# Patient Record
Sex: Female | Born: 1975 | Race: Black or African American | Hispanic: No | Marital: Married | Smoking: Never smoker
Health system: Southern US, Community
[De-identification: ages and names within clinical notes are randomized; demographics above are authoritative.]

## PROBLEM LIST (undated history)

## (undated) DIAGNOSIS — G4733 Obstructive sleep apnea (adult) (pediatric): Secondary | ICD-10-CM

## (undated) DIAGNOSIS — F329 Major depressive disorder, single episode, unspecified: Secondary | ICD-10-CM

## (undated) DIAGNOSIS — E119 Type 2 diabetes mellitus without complications: Secondary | ICD-10-CM

## (undated) DIAGNOSIS — H04123 Dry eye syndrome of bilateral lacrimal glands: Secondary | ICD-10-CM

## (undated) DIAGNOSIS — G4730 Sleep apnea, unspecified: Secondary | ICD-10-CM

## (undated) DIAGNOSIS — E785 Hyperlipidemia, unspecified: Secondary | ICD-10-CM

## (undated) DIAGNOSIS — I10 Essential (primary) hypertension: Secondary | ICD-10-CM

## (undated) DIAGNOSIS — D509 Iron deficiency anemia, unspecified: Secondary | ICD-10-CM

## (undated) DIAGNOSIS — E669 Obesity, unspecified: Secondary | ICD-10-CM

## (undated) DIAGNOSIS — R519 Headache, unspecified: Secondary | ICD-10-CM

## (undated) DIAGNOSIS — B019 Varicella without complication: Secondary | ICD-10-CM

## (undated) DIAGNOSIS — Z973 Presence of spectacles and contact lenses: Secondary | ICD-10-CM

## (undated) DIAGNOSIS — R7303 Prediabetes: Secondary | ICD-10-CM

## (undated) DIAGNOSIS — M6530 Trigger finger, unspecified finger: Secondary | ICD-10-CM

## (undated) DIAGNOSIS — M26609 Unspecified temporomandibular joint disorder, unspecified side: Secondary | ICD-10-CM

## (undated) DIAGNOSIS — M1990 Unspecified osteoarthritis, unspecified site: Secondary | ICD-10-CM

## (undated) DIAGNOSIS — N840 Polyp of corpus uteri: Secondary | ICD-10-CM

## (undated) DIAGNOSIS — F419 Anxiety disorder, unspecified: Secondary | ICD-10-CM

## (undated) DIAGNOSIS — D649 Anemia, unspecified: Secondary | ICD-10-CM

## (undated) DIAGNOSIS — D259 Leiomyoma of uterus, unspecified: Secondary | ICD-10-CM

## (undated) DIAGNOSIS — N921 Excessive and frequent menstruation with irregular cycle: Secondary | ICD-10-CM

## (undated) DIAGNOSIS — N3281 Overactive bladder: Secondary | ICD-10-CM

## (undated) DIAGNOSIS — K219 Gastro-esophageal reflux disease without esophagitis: Secondary | ICD-10-CM

## (undated) DIAGNOSIS — R51 Headache: Secondary | ICD-10-CM

## (undated) DIAGNOSIS — F32A Depression, unspecified: Secondary | ICD-10-CM

## (undated) HISTORY — DX: Hyperlipidemia, unspecified: E78.5

## (undated) HISTORY — DX: Headache: R51

## (undated) HISTORY — DX: Anxiety disorder, unspecified: F41.9

## (undated) HISTORY — DX: Essential (primary) hypertension: I10

## (undated) HISTORY — DX: Varicella without complication: B01.9

## (undated) HISTORY — DX: Headache, unspecified: R51.9

## (undated) HISTORY — DX: Obesity, unspecified: E66.9

## (undated) HISTORY — DX: Depression, unspecified: F32.A

## (undated) HISTORY — PX: WISDOM TOOTH EXTRACTION: SHX21

## (undated) HISTORY — DX: Major depressive disorder, single episode, unspecified: F32.9

---

## 2001-03-04 ENCOUNTER — Other Ambulatory Visit: Admission: RE | Admit: 2001-03-04 | Discharge: 2001-03-04 | Payer: Self-pay | Admitting: *Deleted

## 2002-03-08 ENCOUNTER — Other Ambulatory Visit: Admission: RE | Admit: 2002-03-08 | Discharge: 2002-03-08 | Payer: Self-pay | Admitting: *Deleted

## 2003-04-10 ENCOUNTER — Other Ambulatory Visit: Admission: RE | Admit: 2003-04-10 | Discharge: 2003-04-10 | Payer: Self-pay | Admitting: *Deleted

## 2004-04-19 ENCOUNTER — Inpatient Hospital Stay (HOSPITAL_COMMUNITY): Admission: AD | Admit: 2004-04-19 | Discharge: 2004-04-19 | Payer: Self-pay | Admitting: *Deleted

## 2004-05-01 ENCOUNTER — Inpatient Hospital Stay (HOSPITAL_COMMUNITY): Admission: AD | Admit: 2004-05-01 | Discharge: 2004-05-01 | Payer: Self-pay | Admitting: Obstetrics and Gynecology

## 2004-05-03 ENCOUNTER — Inpatient Hospital Stay (HOSPITAL_COMMUNITY): Admission: AD | Admit: 2004-05-03 | Discharge: 2004-05-06 | Payer: Self-pay | Admitting: Obstetrics and Gynecology

## 2005-04-07 ENCOUNTER — Ambulatory Visit (HOSPITAL_COMMUNITY): Admission: RE | Admit: 2005-04-07 | Discharge: 2005-04-07 | Payer: Self-pay | Admitting: Gynecology

## 2005-08-25 ENCOUNTER — Inpatient Hospital Stay (HOSPITAL_COMMUNITY): Admission: AD | Admit: 2005-08-25 | Discharge: 2005-08-27 | Payer: Self-pay | Admitting: Gynecology

## 2010-03-12 ENCOUNTER — Ambulatory Visit: Payer: Self-pay

## 2011-04-28 ENCOUNTER — Emergency Department: Payer: Self-pay | Admitting: Emergency Medicine

## 2012-10-27 DIAGNOSIS — F419 Anxiety disorder, unspecified: Secondary | ICD-10-CM

## 2012-10-27 HISTORY — DX: Anxiety disorder, unspecified: F41.9

## 2015-01-30 ENCOUNTER — Encounter: Payer: Self-pay | Admitting: *Deleted

## 2016-01-04 LAB — HM MAMMOGRAPHY

## 2016-01-05 LAB — CBC AND DIFFERENTIAL
HCT: 36 % (ref 36–46)
Hemoglobin: 11.8 g/dL — AB (ref 12.0–16.0)
Neutrophils Absolute: 4 /uL
Platelets: 345 10*3/uL (ref 150–399)
WBC: 6.1 10^3/mL

## 2016-01-05 LAB — HEPATIC FUNCTION PANEL
ALT: 20 U/L (ref 7–35)
AST: 21 U/L (ref 13–35)
Bilirubin, Total: 0.2 mg/dL

## 2016-01-05 LAB — BASIC METABOLIC PANEL
BUN: 8 mg/dL (ref 4–21)
Creatinine: 0.7 mg/dL (ref 0.5–1.1)
Glucose: 89 mg/dL
Potassium: 4.1 mmol/L (ref 3.4–5.3)
Sodium: 139 mmol/L (ref 137–147)

## 2016-01-05 LAB — TSH: TSH: 1.69 u[IU]/mL (ref 0.41–5.90)

## 2016-01-14 ENCOUNTER — Encounter: Payer: Self-pay | Admitting: Family Medicine

## 2016-01-14 ENCOUNTER — Ambulatory Visit (INDEPENDENT_AMBULATORY_CARE_PROVIDER_SITE_OTHER): Payer: BLUE CROSS/BLUE SHIELD | Admitting: Family Medicine

## 2016-01-14 VITALS — BP 124/86 | HR 100 | Temp 98.7°F | Ht 66.0 in | Wt 229.8 lb

## 2016-01-14 DIAGNOSIS — F329 Major depressive disorder, single episode, unspecified: Secondary | ICD-10-CM | POA: Insufficient documentation

## 2016-01-14 DIAGNOSIS — R030 Elevated blood-pressure reading, without diagnosis of hypertension: Secondary | ICD-10-CM | POA: Insufficient documentation

## 2016-01-14 DIAGNOSIS — F419 Anxiety disorder, unspecified: Secondary | ICD-10-CM

## 2016-01-14 DIAGNOSIS — Z0000 Encounter for general adult medical examination without abnormal findings: Secondary | ICD-10-CM | POA: Diagnosis not present

## 2016-01-14 DIAGNOSIS — E669 Obesity, unspecified: Secondary | ICD-10-CM

## 2016-01-14 DIAGNOSIS — R7303 Prediabetes: Secondary | ICD-10-CM

## 2016-01-14 DIAGNOSIS — IMO0001 Reserved for inherently not codable concepts without codable children: Secondary | ICD-10-CM

## 2016-01-14 MED ORDER — DIAZEPAM 5 MG PO TABS: ORAL_TABLET | ORAL | Status: DC

## 2016-01-14 NOTE — Assessment & Plan Note (Signed)
Patient reports recent elevated blood pressure. Blood pressure in the prehypertensive range today. Advised patient to check her blood pressure daily and keep a log. If BP consistently elevated greater than 140/90 need medication.

## 2016-01-14 NOTE — Progress Notes (Signed)
Pre visit review using our clinic review tool, if applicable. No additional management support is needed unless otherwise documented below in the visit note. 

## 2016-01-14 NOTE — Assessment & Plan Note (Signed)
Tetanus and influenza vaccines up-to-date. Pap smear & mammogram up-to-date. Discussed genetic screening for cancer as her OB/GYN offers this. Urged her to inquire about this. Patient declined screening labs. Advise diet, exercise, and weight loss.

## 2016-01-14 NOTE — Progress Notes (Addendum)
Subjective:  Patient ID: Kathleen Burke, female    DOB: Mar 08, 1976  Age: 40 y.o. MRN: [id number]  CC: Establish care  HPI Kathleen Burke is a 40 y.o. female presents to the clinic today to establish care.  Preventative Healthcare  Pap smear: Up to date. Recent done on 01/04/16.  Mammogram: Up to date. 01/04/16.  Colonoscopy: Not indicated.   Immunizations  Tetanus - Up to date.   Pneumococcal - Not indicated.   Flu - Up to date.   Zoster - N/A.   Labs: Has had labs via work and [employer]. Patient declined labs today.  Exercise: Does not exercise regularly.  Alcohol use: See below.   Smoking/tobacco use: Nonsmoker  Wears seat belt: Yes.   PMH, Surgical Hx, Family Hx, Social History reviewed and updated as below.  Past Medical History  Diagnosis Date  . Chicken pox   . Frequent headaches   . Hypertension    Past Surgical History  Procedure Laterality Date  . No past surgeries     Family History  Problem Relation Age of Onset  . Arthritis Mother   . Ovarian cancer Mother   . Hyperlipidemia Mother   . Hypertension Mother   . Diabetes Father   . Breast cancer Maternal Grandmother   . Arthritis Paternal Grandmother   . Arthritis Paternal Grandfather   . Prostate cancer Paternal Grandfather   . Hyperlipidemia Paternal Grandfather   . Heart disease Paternal Grandfather   . Stroke Paternal Grandfather   . Hypertension Paternal Grandfather   . Diabetes Paternal Grandfather    Social History  Substance Use Topics  . Smoking status: Never Smoker   . Smokeless tobacco: Never Used  . Alcohol Use: 0.0 - 0.6 oz/week    0-1 Standard drinks or equivalent per week   Review of Systems  Cardiovascular:       Recent elevated BP.  Neurological: Positive for headaches.  Psychiatric/Behavioral: The patient is nervous/anxious.   All other systems reviewed and are negative.  Objective:   Today's Vitals: BP 124/86 mmHg  Pulse 100  Temp(Src) 98.7 F (37.1 C) (Oral)   Ht  (1.676 m)  Wt 229 lb 12 oz (104.214 kg)  BMI 37.10 kg/m2  SpO2 97%  Physical Exam  Constitutional: She is oriented to person, place, and time. She appears well-developed and well-nourished. No distress.  Obese.  HENT:  Head: Normocephalic and atraumatic.  Mouth/Throat: Oropharynx is clear and moist. No oropharyngeal exudate.  Normal TM's bilaterally.   Eyes: Conjunctivae are normal. No scleral icterus.  Neck: Neck supple. No thyromegaly present.  Cardiovascular: Normal rate and regular rhythm.   No murmur heard. Pulmonary/Chest: Effort normal and breath sounds normal. She has no wheezes. She has no rales.  Abdominal: Soft. She exhibits no distension. There is no tenderness. There is no rebound and no guarding.  Musculoskeletal: Normal range of motion. She exhibits no edema.  Lymphadenopathy:    She has no cervical adenopathy.  Neurological: She is alert and oriented to person, place, and time.  Skin: Skin is warm and dry. No rash noted.  Psychiatric: She has a normal mood and affect.  Vitals reviewed.  Assessment & Plan:   Problem List Items Addressed This Visit    Preventative health care - Primary    Tetanus and influenza vaccines up-to-date. Pap smear & mammogram up-to-date. Discussed genetic screening for cancer as her OB/GYN offers this. Urged her to inquire about this. Patient declined screening labs. Advise diet,  exercise, and weight loss.      Prediabetes   Obesity (BMI 30-39.9)   Elevated BP    Patient reports recent elevated blood pressure. Blood pressure in the prehypertensive range today. Advised patient to check her blood pressure daily and keep a log. If BP consistently elevated greater than 140/90 need medication.      Anxiety   Relevant Medications   venlafaxine XR ([name]) 37.5 MG 24 hr capsule      Outpatient Encounter Prescriptions as of 01/14/2016  Medication Sig  . cholecalciferol (VITAMIN D) 1000 units tablet Take 1,000 Units by  mouth daily.  . Iron Combinations (IRON COMPLEX PO) Take by mouth.  . venlafaxine XR ([name]) 37.5 MG 24 hr capsule Take 37.5 mg by mouth daily.   No facility-administered encounter medications on file as of 01/14/2016.    Follow-up: Annually.  Everlene OtherJayce Americus Perkey DO [facility] [place]

## 2016-01-14 NOTE — Patient Instructions (Signed)
Continue your current medications.  Check your BP daily. Let me know if they are consistently above 140/90.  Follow up annually.  Take care  Dr. Lacinda Axon    Health Maintenance, Female Adopting a healthy lifestyle and getting preventive care can go a long way to promote health and wellness. Talk with your health care provider about what schedule of regular examinations is right for you. This is a good chance for you to check in with your provider about disease prevention and staying healthy. In between checkups, there are plenty of things you can do on your own. Experts have done a lot of research about which lifestyle changes and preventive measures are most likely to keep you healthy. Ask your health care provider for more information. WEIGHT AND DIET  Eat a healthy diet  Be sure to include plenty of vegetables, fruits, low-fat dairy products, and lean protein.  Do not eat a lot of foods high in solid fats, added sugars, or salt.  Get regular exercise. This is one of the most important things you can do for your health.  Most adults should exercise for at least 150 minutes each week. The exercise should increase your heart rate and make you sweat (moderate-intensity exercise).  Most adults should also do strengthening exercises at least twice a week. This is in addition to the moderate-intensity exercise.  Maintain a healthy weight  Body mass index (BMI) is a measurement that can be used to identify possible weight problems. It estimates body fat based on height and weight. Your health care provider can help determine your BMI and help you achieve or maintain a healthy weight.  For females 53 years of age and older:   A BMI below 18.5 is considered underweight.  A BMI of 18.5 to 24.9 is normal.  A BMI of 25 to 29.9 is considered overweight.  A BMI of 30 and above is considered obese.  Watch levels of cholesterol and blood lipids  You should start having your blood tested for  lipids and cholesterol at 42 years of age, then have this test every 5 years.  You may need to have your cholesterol levels checked more often if:  Your lipid or cholesterol levels are high.  You are older than 42 years of age.  You are at high risk for heart disease.  CANCER SCREENING   Lung Cancer  Lung cancer screening is recommended for adults 48-74 years old who are at high risk for lung cancer because of a history of smoking.  A yearly low-dose CT scan of the lungs is recommended for people who:  Currently smoke.  Have quit within the past 15 years.  Have at least a 30-pack-year history of smoking. A pack year is smoking an average of one pack of cigarettes a day for 1 year.  Yearly screening should continue until it has been 15 years since you quit.  Yearly screening should stop if you develop a health problem that would prevent you from having lung cancer treatment.  Breast Cancer  Practice breast self-awareness. This means understanding how your breasts normally appear and feel.  It also means doing regular breast self-exams. Let your health care provider know about any changes, no matter how small.  If you are in your 20s or 30s, you should have a clinical breast exam (CBE) by a health care provider every 1-3 years as part of a regular health exam.  If you are 88 or older, have a CBE every year.  Also consider having a breast X-ray (mammogram) every year.  If you have a family history of breast cancer, talk to your health care provider about genetic screening.  If you are at high risk for breast cancer, talk to your health care provider about having an MRI and a mammogram every year.  Breast cancer gene (BRCA) assessment is recommended for women who have family members with BRCA-related cancers. BRCA-related cancers include:  Breast.  Ovarian.  Tubal.  Peritoneal cancers.  Results of the assessment will determine the need for genetic counseling and BRCA1  and BRCA2 testing. Cervical Cancer Your health care provider may recommend that you be screened regularly for cancer of the pelvic organs (ovaries, uterus, and vagina). This screening involves a pelvic examination, including checking for microscopic changes to the surface of your cervix (Pap test). You may be encouraged to have this screening done every 3 years, beginning at age 20.  For women ages 5-65, health care providers may recommend pelvic exams and Pap testing every 3 years, or they may recommend the Pap and pelvic exam, combined with testing for human papilloma virus (HPV), every 5 years. Some types of HPV increase your risk of cervical cancer. Testing for HPV may also be done on women of any age with unclear Pap test results.  Other health care providers may not recommend any screening for nonpregnant women who are considered low risk for pelvic cancer and who do not have symptoms. Ask your health care provider if a screening pelvic exam is right for you.  If you have had past treatment for cervical cancer or a condition that could lead to cancer, you need Pap tests and screening for cancer for at least 20 years after your treatment. If Pap tests have been discontinued, your risk factors (such as having a new sexual partner) need to be reassessed to determine if screening should resume. Some women have medical problems that increase the chance of getting cervical cancer. In these cases, your health care provider may recommend more frequent screening and Pap tests. Colorectal Cancer  This type of cancer can be detected and often prevented.  Routine colorectal cancer screening usually begins at 42 years of age and continues through 42 years of age.  Your health care provider may recommend screening at an earlier age if you have risk factors for colon cancer.  Your health care provider may also recommend using home test kits to check for hidden blood in the stool.  A small camera at the  end of a tube can be used to examine your colon directly (sigmoidoscopy or colonoscopy). This is done to check for the earliest forms of colorectal cancer.  Routine screening usually begins at age 74.  Direct examination of the colon should be repeated every 5-10 years through 42 years of age. However, you may need to be screened more often if early forms of precancerous polyps or small growths are found. Skin Cancer  Check your skin from head to toe regularly.  Tell your health care provider about any new moles or changes in moles, especially if there is a change in a mole's shape or color.  Also tell your health care provider if you have a mole that is larger than the size of a pencil eraser.  Always use sunscreen. Apply sunscreen liberally and repeatedly throughout the day.  Protect yourself by wearing long sleeves, pants, a wide-brimmed hat, and sunglasses whenever you are outside. HEART DISEASE, DIABETES, AND HIGH BLOOD PRESSURE  High blood pressure causes heart disease and increases the risk of stroke. High blood pressure is more likely to develop in:  People who have blood pressure in the high end of the normal range (130-139/85-89 mm Hg).  People who are overweight or obese.  People who are African American.  If you are 18-39 years of age, have your blood pressure checked every 3-5 years. If you are 40 years of age or older, have your blood pressure checked every year. You should have your blood pressure measured twice--once when you are at a hospital or clinic, and once when you are not at a hospital or clinic. Record the average of the two measurements. To check your blood pressure when you are not at a hospital or clinic, you can use:  An automated blood pressure machine at a pharmacy.  A home blood pressure monitor.  If you are between 55 years and 79 years old, ask your health care provider if you should take aspirin to prevent strokes.  Have regular diabetes  screenings. This involves taking a blood sample to check your fasting blood sugar level.  If you are at a normal weight and have a low risk for diabetes, have this test once every three years after 42 years of age.  If you are overweight and have a high risk for diabetes, consider being tested at a younger age or more often. PREVENTING INFECTION  Hepatitis B  If you have a higher risk for hepatitis B, you should be screened for this virus. You are considered at high risk for hepatitis B if:  You were born in a country where hepatitis B is common. Ask your health care provider which countries are considered high risk.  Your parents were born in a high-risk country, and you have not been immunized against hepatitis B (hepatitis B vaccine).  You have HIV or AIDS.  You use needles to inject street drugs.  You live with someone who has hepatitis B.  You have had sex with someone who has hepatitis B.  You get hemodialysis treatment.  You take certain medicines for conditions, including cancer, organ transplantation, and autoimmune conditions. Hepatitis C  Blood testing is recommended for:  Everyone born from 1945 through 1965.  Anyone with known risk factors for hepatitis C. Sexually transmitted infections (STIs)  You should be screened for sexually transmitted infections (STIs) including gonorrhea and chlamydia if:  You are sexually active and are younger than 42 years of age.  You are older than 42 years of age and your health care provider tells you that you are at risk for this type of infection.  Your sexual activity has changed since you were last screened and you are at an increased risk for chlamydia or gonorrhea. Ask your health care provider if you are at risk.  If you do not have HIV, but are at risk, it may be recommended that you take a prescription medicine daily to prevent HIV infection. This is called pre-exposure prophylaxis (PrEP). You are considered at risk  if:  You are sexually active and do not regularly use condoms or know the HIV status of your partner(s).  You take drugs by injection.  You are sexually active with a partner who has HIV. Talk with your health care provider about whether you are at high risk of being infected with HIV. If you choose to begin PrEP, you should first be tested for HIV. You should then be tested every 3 months for as   long as you are taking PrEP.  PREGNANCY   If you are premenopausal and you may become pregnant, ask your health care provider about preconception counseling.  If you may become pregnant, take 400 to 800 micrograms (mcg) of folic acid every day.  If you want to prevent pregnancy, talk to your health care provider about birth control (contraception). OSTEOPOROSIS AND MENOPAUSE   Osteoporosis is a disease in which the bones lose minerals and strength with aging. This can result in serious bone fractures. Your risk for osteoporosis can be identified using a bone density scan.  If you are 30 years of age or older, or if you are at risk for osteoporosis and fractures, ask your health care provider if you should be screened.  Ask your health care provider whether you should take a calcium or vitamin D supplement to lower your risk for osteoporosis.  Menopause may have certain physical symptoms and risks.  Hormone replacement therapy may reduce some of these symptoms and risks. Talk to your health care provider about whether hormone replacement therapy is right for you.  HOME CARE INSTRUCTIONS   Schedule regular health, dental, and eye exams.  Stay current with your immunizations.   Do not use any tobacco products including cigarettes, chewing tobacco, or electronic cigarettes.  If you are pregnant, do not drink alcohol.  If you are breastfeeding, limit how much and how often you drink alcohol.  Limit alcohol intake to no more than 1 drink per day for nonpregnant women. One drink equals 12  ounces of beer, 5 ounces of wine, or 1 ounces of hard liquor.  Do not use street drugs.  Do not share needles.  Ask your health care provider for help if you need support or information about quitting drugs.  Tell your health care provider if you often feel depressed.  Tell your health care provider if you have ever been abused or do not feel safe at home.   This information is not intended to replace advice given to you by your health care provider. Make sure you discuss any questions you have with your health care provider.   Document Released: 04/28/2011 Document Revised: 11/03/2014 Document Reviewed: 09/14/2013 Elsevier Interactive Patient Education [employer].

## 2016-01-14 NOTE — Addendum Note (Signed)
Addended by: Tommie SamsOOK, Regene Mccarthy G on: 01/14/2016 02:05 PM   Modules accepted: Orders

## 2016-02-19 ENCOUNTER — Encounter: Payer: Self-pay | Admitting: Family Medicine

## 2017-02-26 ENCOUNTER — Ambulatory Visit: Payer: Self-pay | Admitting: Certified Nurse Midwife

## 2017-02-27 ENCOUNTER — Ambulatory Visit (INDEPENDENT_AMBULATORY_CARE_PROVIDER_SITE_OTHER): Payer: BLUE CROSS/BLUE SHIELD | Admitting: Certified Nurse Midwife

## 2017-02-27 ENCOUNTER — Encounter: Payer: Self-pay | Admitting: Certified Nurse Midwife

## 2017-02-27 VITALS — BP 160/90 | HR 105 | Ht 66.0 in | Wt 240.0 lb

## 2017-02-27 DIAGNOSIS — Z1231 Encounter for screening mammogram for malignant neoplasm of breast: Secondary | ICD-10-CM

## 2017-02-27 DIAGNOSIS — Z01419 Encounter for gynecological examination (general) (routine) without abnormal findings: Secondary | ICD-10-CM

## 2017-02-27 DIAGNOSIS — Z124 Encounter for screening for malignant neoplasm of cervix: Secondary | ICD-10-CM | POA: Diagnosis not present

## 2017-02-27 DIAGNOSIS — R030 Elevated blood-pressure reading, without diagnosis of hypertension: Secondary | ICD-10-CM | POA: Diagnosis not present

## 2017-02-27 DIAGNOSIS — Z1239 Encounter for other screening for malignant neoplasm of breast: Secondary | ICD-10-CM

## 2017-02-27 NOTE — Progress Notes (Signed)
Gynecology Annual Exam  PCP: Tommie Samsook, Jayce G, DO  Chief Complaint:  Chief Complaint  Patient presents with  . Gynecologic Exam    History of Present Illness: Kathleen Burke is a 41 year old African American/Black female, G2 P2002, who presents for her annual exam. She has no significant gyn problems.  Her menses have been regular since her last annual exam on 01/04/16. LMP 02/17/2017. They occur every 1 month , they lasted 6 days , and are medium  Flow. She had some menstrual irregularity at the beginning of 2017, but that resolved spontaneously.  She has had no intermenstrual  spotting.  She reports dysmenorrhea  thru the first 2days of her menses. She uses Excedrin with symptomatic relief.  The patient's past medical history is notable for obesity, anxiety (on Effexor since 2014),  menorrhagia (had an endometrial biopsy in 2014 which was B9), AGUS Pap in 2014 ( colpo and ECC negative) and a family history of of hypertension, hypercholesterolemia, and diabetes.. Since her last annual exam , she has had very labile blood pressures, but has not been prescribed antihypertensives. She has also gained 9# and her BMI is 38.74 kg/m2.. She takes iron for anemia   She is sexually active. She is currently using a vasectomy for contraception.  Her most recent pap smear was obtained 01/04/16 and was NIL/ neg HRHPV Her most recent mammogram obtained 1 year ago was normal.  There is a positive history of breast cancer in her maternal grandmother. Genetic testing has not been done.  There is no family history of ovarian cancer.  The patient does do maonthly self breast exams.  The patient does not smoke.  The patient does drink 2 glasses of wine on the weekends.  The patient does not use illegal drugs.  The patient does exercise twice a week by walking x 1 hour. The patient does get adequate calcium in her diet.  She had a recent cholesterol screen in 2017 that was normal.   Review of Systems:  Review of Systems  Constitutional: Negative for chills, fever and weight loss.       Positive for weight gain  HENT: Negative for congestion, sinus pain and sore throat.   Eyes: Negative for blurred vision and pain.  Respiratory: Negative for hemoptysis, shortness of breath and wheezing.   Cardiovascular: Negative for chest pain, palpitations and leg swelling.  Gastrointestinal: Negative for abdominal pain, blood in stool, diarrhea, heartburn, nausea and vomiting.  Genitourinary: Negative for dysuria, frequency, hematuria and urgency.  Musculoskeletal: Negative for back pain, joint pain and myalgias.  Skin: Negative for itching and rash.  Neurological: Negative for dizziness, tingling and headaches.  Endo/Heme/Allergies: Negative for environmental allergies and polydipsia. Does not bruise/bleed easily.       Negative for hirsutism   Psychiatric/Behavioral: Negative for depression. The patient is not nervous/anxious and does not have insomnia.     Past Medical History:  Past Medical History:  Diagnosis Date  . Anxiety 2014  . Chicken pox   . Frequent headaches   . Hypertension   . Obesity (BMI 30-39.9)     Past Surgical History:  Past Surgical History:  Procedure Laterality Date  . WISDOM TOOTH EXTRACTION        Family History:  Family History  Problem Relation Age of Onset  . Arthritis Mother   . Hyperlipidemia Mother   . Hypertension Mother   . Diabetes Father     DM I  . Breast  cancer Maternal Grandmother 50  . Arthritis Paternal Grandmother   . Diabetes Mellitus II Paternal Grandmother   . Arthritis Paternal Grandfather   . Hyperlipidemia Paternal Grandfather   . Stroke Paternal Grandfather   . Hypertension Paternal Grandfather   . Diabetes Mellitus II Sister   . Diabetes Mellitus II Sister   . Hyperlipidemia Sister   . Hypertension Sister     Social History:  Social History   Social History  . Marital status: Married    Spouse name: N/A  . Number of  children: 2  . Years of education: N/A   Occupational History  . RN    Social History Main Topics  . Smoking status: Never Smoker  . Smokeless tobacco: Never Used  . Alcohol use 1.2 oz/week    2 Glasses of wine per week  . Drug use: No  . Sexual activity: Yes    Partners: Male    Birth control/ protection: Surgical     Comment: vasectomy   Other Topics Concern  . Not on file   Social History Narrative  . No narrative on file    Allergies:  No Known Allergies  Medications: Prior to Admission medications   Medication Sig Start Date End Date Taking? Authorizing Provider  ALPRAZolam Prudy Feeler) 0.5 MG tablet  01/29/17   Historical Provider, MD  cholecalciferol (VITAMIN D) 1000 units tablet Take 1,000 Units by mouth daily.    Historical Provider, MD  diazepam (VALIUM) 5 MG tablet 1 tablet once; 1 hour prior to flying. 01/14/16   Tommie Sams, DO  Iron Combinations (IRON COMPLEX PO) Take by mouth.    Historical Provider, MD  venlafaxine XR ([name]) 37.5 MG 24 hr capsule Take 37.5 mg by mouth daily. 11/19/15   Historical Provider, MD    Physical Exam Vitals: Blood pressure (!) 160/90, pulse (!) 105, height 5\' 6"  (1.676 m), weight 108.9 kg (240 lb), BMI 38.74 kg/m2, last menstrual period 02/17/2017, SpO2 98 %.  General: pleasant BF, in NAD HEENT: normocephalic, anicteric Thyroid: no enlargement, no palpable nodules Pulmonary: No increased work of breathing, CTAB Cardiovascular: RRR without murmur Breast: Breast symmetrical, no tenderness, no palpable nodules or masses, no skin or nipple retraction present, no nipple discharge.  No axillary, infraclavicular, or supraclavicular lymphadenopathy. Abdomen: obese, soft, non-tender, non-distended.  Umbilicus without lesions.  No hepatomegaly or masses palpable. No evidence of hernia  Genitourinar  External: Normal external female genitalia.  Normal urethral meatus, normal Bartholin's and Skene's glands.    Vagina: Normal vaginal mucosa,  no evidence of prolapse.    Cervix: Grossly normal in appearance, no bleeding  Uterus: RF, decreased mobility, TLNS, NT  Adnexa: no adnexal masses, NT  Rectal: deferred  Lymphatic: no evidence of inguinal lymphadenopathy Extremities: no edema, erythema, or tenderness Neurologic: Grossly intact Psychiatric: mood appropriate, affect full      Assessment: 41 y.o. [id number] well woman exam  Plan: Problem List Items Addressed This Visit    None    Visit Diagnoses    Screening for cervical cancer    -  Primary   Relevant Orders   IGP,rfx Aptima HPV all pth   Encounter for gynecological examination       Relevant Orders   IGP,rfx Aptima HPV all pth   Elevated blood pressure reading in office with white coat syndrome, without diagnosis of hypertension       Screening for breast cancer       Relevant Orders   MM DIGITAL SCREENING BILATERAL  1) Mammogram - recommend yearly screening mammogram.  Mammogram Was ordered today. Patient to schedule appointment  2) Pap done - only one normal Pap since AGUS Pap  3) Routine healthcare maintenance including cholesterol, diabetes screening  managed by PCP.   4) Elevated blood pressure today. Reports her blood pressures are normal at home but are elevated at doctor's visits. Encouraged to have her blood pressure checked at work and to report elevations to her PCP.    Farrel Conners, CNM

## 2017-03-01 ENCOUNTER — Encounter: Payer: Self-pay | Admitting: Certified Nurse Midwife

## 2017-03-05 LAB — IGP,RFX APTIMA HPV ALL PTH: PAP Smear Comment: 0

## 2017-04-08 ENCOUNTER — Ambulatory Visit
Admission: RE | Admit: 2017-04-08 | Discharge: 2017-04-08 | Disposition: A | Discharge status: Home / Self Care | Payer: BLUE CROSS/BLUE SHIELD | Source: Ambulatory Visit | Attending: Certified Nurse Midwife | Admitting: Certified Nurse Midwife

## 2017-04-08 DIAGNOSIS — Z1231 Encounter for screening mammogram for malignant neoplasm of breast: Secondary | ICD-10-CM | POA: Diagnosis not present

## 2017-04-08 DIAGNOSIS — Z1239 Encounter for other screening for malignant neoplasm of breast: Secondary | ICD-10-CM

## 2017-04-14 ENCOUNTER — Inpatient Hospital Stay
Admission: RE | Admit: 2017-04-14 | Discharge: 2017-04-14 | Disposition: A | Discharge status: Home / Self Care | Payer: Self-pay | Source: Ambulatory Visit | Attending: *Deleted | Admitting: *Deleted

## 2017-04-14 ENCOUNTER — Other Ambulatory Visit: Payer: Self-pay | Admitting: *Deleted

## 2017-04-14 DIAGNOSIS — Z9289 Personal history of other medical treatment: Secondary | ICD-10-CM

## 2018-03-16 ENCOUNTER — Encounter (INDEPENDENT_AMBULATORY_CARE_PROVIDER_SITE_OTHER): Payer: Self-pay

## 2018-03-16 ENCOUNTER — Encounter: Payer: Self-pay | Admitting: Internal Medicine

## 2018-03-16 ENCOUNTER — Ambulatory Visit: Payer: BLUE CROSS/BLUE SHIELD | Admitting: Internal Medicine

## 2018-03-16 VITALS — BP 142/88 | HR 102 | Temp 98.4°F | Ht 66.0 in | Wt 242.8 lb

## 2018-03-16 DIAGNOSIS — Z1329 Encounter for screening for other suspected endocrine disorder: Secondary | ICD-10-CM

## 2018-03-16 DIAGNOSIS — Z1159 Encounter for screening for other viral diseases: Secondary | ICD-10-CM

## 2018-03-16 DIAGNOSIS — F419 Anxiety disorder, unspecified: Secondary | ICD-10-CM | POA: Diagnosis not present

## 2018-03-16 DIAGNOSIS — Z1389 Encounter for screening for other disorder: Secondary | ICD-10-CM

## 2018-03-16 DIAGNOSIS — E669 Obesity, unspecified: Secondary | ICD-10-CM | POA: Diagnosis not present

## 2018-03-16 DIAGNOSIS — F329 Major depressive disorder, single episode, unspecified: Secondary | ICD-10-CM

## 2018-03-16 DIAGNOSIS — Z0184 Encounter for antibody response examination: Secondary | ICD-10-CM | POA: Diagnosis not present

## 2018-03-16 DIAGNOSIS — F32A Depression, unspecified: Secondary | ICD-10-CM

## 2018-03-16 DIAGNOSIS — Z1231 Encounter for screening mammogram for malignant neoplasm of breast: Secondary | ICD-10-CM | POA: Diagnosis not present

## 2018-03-16 DIAGNOSIS — R030 Elevated blood-pressure reading, without diagnosis of hypertension: Secondary | ICD-10-CM | POA: Diagnosis not present

## 2018-03-16 DIAGNOSIS — E559 Vitamin D deficiency, unspecified: Secondary | ICD-10-CM

## 2018-03-16 DIAGNOSIS — R7303 Prediabetes: Secondary | ICD-10-CM | POA: Diagnosis not present

## 2018-03-16 MED ORDER — VENLAFAXINE HCL ER 37.5 MG PO CP24: 37.5000 mg | ORAL_CAPSULE | Freq: Every day | ORAL | 3 refills | Status: DC

## 2018-03-16 MED ORDER — DIAZEPAM 5 MG PO TABS: ORAL_TABLET | ORAL | 0 refills | Status: DC

## 2018-03-16 NOTE — Progress Notes (Signed)
Pre visit review using our clinic review tool, if applicable. No additional management support is needed unless otherwise documented below in the visit note. 

## 2018-03-16 NOTE — Progress Notes (Signed)
Chief Complaint  Patient presents with  . Follow-up    transfer from [facility]   F/u 1. Anxiety and depression needs refill of effexor 37.5 xl and valium 5 mg for flying trip upcoming 04/2018 she gets claustrophobia with flying  2. BP elevated today   Review of Systems  Constitutional: Negative for weight loss.  HENT: Negative for hearing loss.   Respiratory: Negative for shortness of breath.   Cardiovascular: Negative for chest pain.  Gastrointestinal: Negative for abdominal pain.  Musculoskeletal: Negative for falls.  Skin: Negative for rash.  Psychiatric/Behavioral: Positive for depression. The patient is nervous/anxious.    Past Medical History:  Diagnosis Date  . Anxiety 2014  . Chicken pox   . Frequent headaches   . Hypertension   . Obesity (BMI 30-39.9)    Past Surgical History:  Procedure Laterality Date  . WISDOM TOOTH EXTRACTION     Family History  Problem Relation Age of Onset  . Arthritis Mother   . Hyperlipidemia Mother   . Hypertension Mother   . Diabetes Father        DM I  . Breast cancer Maternal Grandmother 50  . Arthritis Paternal Grandmother   . Diabetes Mellitus II Paternal Grandmother   . Arthritis Paternal Grandfather   . Hyperlipidemia Paternal Grandfather   . Stroke Paternal Grandfather   . Hypertension Paternal Grandfather   . Diabetes Mellitus II Sister   . Diabetes Mellitus II Sister   . Hyperlipidemia Sister   . Hypertension Sister    Social History   Socioeconomic History  . Marital status: Married    Spouse name: Not on file  . Number of children: 2  . Years of education: Not on file  . Highest education level: Not on file  Occupational History  . Occupation: Charity fundraiser  Social Needs  . Financial resource strain: Not on file  . Food insecurity:    Worry: Not on file    Inability: Not on file  . Transportation needs:    Medical: Not on file    Non-medical: Not on file  Tobacco Use  . Smoking status: Never Smoker  . Smokeless  tobacco: Never Used  Substance and Sexual Activity  . Alcohol use: Yes    Alcohol/week: 1.2 oz    Types: 2 Glasses of wine per week  . Drug use: No  . Sexual activity: Yes    Partners: Male    Birth control/protection: Surgical    Comment: vasectomy  Lifestyle  . Physical activity:    Days per week: Not on file    Minutes per session: Not on file  . Stress: Not on file  Relationships  . Social connections:    Talks on phone: Not on file    Gets together: Not on file    Attends religious service: Not on file    Active member of club or organization: Not on file    Attends meetings of clubs or organizations: Not on file    Relationship status: Not on file  . Intimate partner violence:    Fear of current or ex partner: Not on file    Emotionally abused: Not on file    Physically abused: Not on file    Forced sexual activity: Not on file  Other Topics Concern  . Not on file  Social History Narrative  . Not on file   Current Meds  Medication Sig  . ALPRAZolam (XANAX) 0.5 MG tablet   . diazepam (VALIUM) 5 MG  tablet 1 tablet once; 1 hour prior to flying.  . venlafaxine XR ([name]) 37.5 MG 24 hr capsule Take 37.5 mg by mouth daily.   No Known Allergies No results found for this or any previous visit (from the past 2160 hour(s)). Objective  Body mass index is 39.19 kg/m. Wt Readings from Last 3 Encounters:  03/16/18 242 lb 12.8 oz (110.1 kg)  02/27/17 240 lb (108.9 kg)  01/14/16 229 lb 12 oz (104.2 kg)   Temp Readings from Last 3 Encounters:  03/16/18 98.4 F (36.9 C) (Oral)  01/14/16 98.7 F (37.1 C) (Oral)   BP Readings from Last 3 Encounters:  03/16/18 (!) 142/88  02/27/17 (!) 160/90  01/14/16 124/86   Pulse Readings from Last 3 Encounters:  03/16/18 (!) 102  02/27/17 (!) 105  01/14/16 100    Physical Exam  Constitutional: She is oriented to person, place, and time. Vital signs are normal. She appears well-developed and well-nourished. She is  cooperative.  HENT:  Head: Normocephalic and atraumatic.  Mouth/Throat: Oropharynx is clear and moist and mucous membranes are normal.  Eyes: Pupils are equal, round, and reactive to light. Conjunctivae are normal.  Cardiovascular: Regular rhythm and normal heart sounds. Tachycardia present.  Pulmonary/Chest: Effort normal and breath sounds normal.  Neurological: She is alert and oriented to person, place, and time. Gait normal.  Skin: Skin is warm, dry and intact.  Psychiatric: She has a normal mood and affect. Her speech is normal and behavior is normal. Judgment and thought content normal. Cognition and memory are normal.  Nursing note and vitals reviewed.   Assessment   1. Anxiety and depression  2. Elevated BP no h/o HTN  3. Obesity BMI >39  4. HM Plan  1.  Valium prn flights effexor refilled  Disc meditation  2.  Recheck at f/u  Labs before next visit  3. rec exercise to lose and healthy eating  4.  Unknown flu  utd tdap  Check labs  Pap [facility] upcoming Referred mammo today due 04/08/18  Declines HIV testing   Provider: Dr. French Ana [name]-[facility]

## 2018-03-16 NOTE — Patient Instructions (Addendum)
Mammogram 04/08/18 please schedule  Insight timer, headspace or calm  More self care  sch fasting labs  F/u w/in 3 months    DASH Eating Plan DASH stands for "Dietary Approaches to Stop Hypertension." The DASH eating plan is a healthy eating plan that has been shown to reduce high blood pressure (hypertension). It may also reduce your risk for type 2 diabetes, heart disease, and stroke. The DASH eating plan may also help with weight loss. What are tips for following this plan? General guidelines  Avoid eating more than 2,300 mg (milligrams) of salt (sodium) a day. If you have hypertension, you may need to reduce your sodium intake to 1,500 mg a day.  Limit alcohol intake to no more than 1 drink a day for nonpregnant women and 2 drinks a day for men. One drink equals 12 oz of beer, 5 oz of wine, or 1 oz of hard liquor.  Work with your health care provider to maintain a healthy body weight or to lose weight. Ask what an ideal weight is for you.  Get at least 30 minutes of exercise that causes your heart to beat faster (aerobic exercise) most days of the week. Activities may include walking, swimming, or biking.  Work with your health care provider or diet and nutrition specialist (dietitian) to adjust your eating plan to your individual calorie needs. Reading food labels  Check food labels for the amount of sodium per serving. Choose foods with less than 5 percent of the Daily Value of sodium. Generally, foods with less than 300 mg of sodium per serving fit into this eating plan.  To find whole grains, look for the word "whole" as the first word in the ingredient list. Shopping  Buy products labeled as "low-sodium" or "no salt added."  Buy fresh foods. Avoid canned foods and premade or frozen meals. Cooking  Avoid adding salt when cooking. Use salt-free seasonings or herbs instead of table salt or sea salt. Check with your health care provider or pharmacist before using salt  substitutes.  Do not fry foods. Cook foods using healthy methods such as baking, boiling, grilling, and broiling instead.  Cook with heart-healthy oils, such as olive, canola, soybean, or sunflower oil. Meal planning   Eat a balanced diet that includes: ? 5 or more servings of fruits and vegetables each day. At each meal, try to fill half of your plate with fruits and vegetables. ? Up to 6-8 servings of whole grains each day. ? Less than 6 oz of lean meat, poultry, or fish each day. A 3-oz serving of meat is about the same size as a deck of cards. One egg equals 1 oz. ? 2 servings of low-fat dairy each day. ? A serving of nuts, seeds, or beans 5 times each week. ? Heart-healthy fats. Healthy fats called Omega-3 fatty acids are found in foods such as flaxseeds and coldwater fish, like sardines, salmon, and mackerel.  Limit how much you eat of the following: ? Canned or prepackaged foods. ? Food that is high in trans fat, such as fried foods. ? Food that is high in saturated fat, such as fatty meat. ? Sweets, desserts, sugary drinks, and other foods with added sugar. ? Full-fat dairy products.  Do not salt foods before eating.  Try to eat at least 2 vegetarian meals each week.  Eat more home-cooked food and less restaurant, buffet, and fast food.  When eating at a restaurant, ask that your food be prepared with  less salt or no salt, if possible. What foods are recommended? The items listed may not be a complete list. Talk with your dietitian about what dietary choices are best for you. Grains Whole-grain or whole-wheat bread. Whole-grain or whole-wheat pasta. Brown rice. [place]. Bulgur. Whole-grain and low-sodium cereals. Pita bread. Low-fat, low-sodium crackers. Whole-wheat flour tortillas. Vegetables Fresh or frozen vegetables (raw, steamed, roasted, or grilled). Low-sodium or reduced-sodium tomato and vegetable juice. Low-sodium or reduced-sodium tomato sauce and tomato  paste. Low-sodium or reduced-sodium canned vegetables. Fruits All fresh, dried, or frozen fruit. Canned fruit in natural juice (without added sugar). Meat and other protein foods Skinless chicken or [place]. Ground chicken or [place]. Pork with fat trimmed off. Fish and seafood. Egg whites. Dried beans, peas, or lentils. Unsalted nuts, nut butters, and seeds. Unsalted canned beans. Lean cuts of beef with fat trimmed off. Low-sodium, lean deli meat. Dairy Low-fat (1%) or fat-free (skim) milk. Fat-free, low-fat, or reduced-fat cheeses. Nonfat, low-sodium ricotta or cottage cheese. Low-fat or nonfat yogurt. Low-fat, low-sodium cheese. Fats and oils Soft margarine without trans fats. Vegetable oil. Low-fat, reduced-fat, or light mayonnaise and salad dressings (reduced-sodium). Canola, safflower, olive, soybean, and sunflower oils. Avocado. Seasoning and other foods Herbs. Spices. Seasoning mixes without salt. Unsalted popcorn and pretzels. Fat-free sweets. What foods are not recommended? The items listed may not be a complete list. Talk with your dietitian about what dietary choices are best for you. Grains Baked goods made with fat, such as croissants, muffins, or some breads. Dry pasta or rice meal packs. Vegetables Creamed or fried vegetables. Vegetables in a cheese sauce. Regular canned vegetables (not low-sodium or reduced-sodium). Regular canned tomato sauce and paste (not low-sodium or reduced-sodium). Regular tomato and vegetable juice (not low-sodium or reduced-sodium). Angie Fava. Olives. Fruits Canned fruit in a light or heavy syrup. Fried fruit. Fruit in cream or butter sauce. Meat and other protein foods Fatty cuts of meat. Ribs. Fried meat. Berniece Salines. Sausage. Bologna and other processed lunch meats. Salami. Fatback. Hotdogs. Bratwurst. Salted nuts and seeds. Canned beans with added salt. Canned or smoked fish. Whole eggs or egg yolks. Chicken or [place] with skin. Dairy Whole or 2% milk,  cream, and half-and-half. Whole or full-fat cream cheese. Whole-fat or sweetened yogurt. Full-fat cheese. Nondairy creamers. Whipped toppings. Processed cheese and cheese spreads. Fats and oils Butter. Stick margarine. Lard. Shortening. Ghee. Bacon fat. Tropical oils, such as coconut, palm kernel, or palm oil. Seasoning and other foods Salted popcorn and pretzels. Onion salt, garlic salt, seasoned salt, table salt, and sea salt. Worcestershire sauce. Tartar sauce. Barbecue sauce. Teriyaki sauce. Soy sauce, including reduced-sodium. Steak sauce. Canned and packaged gravies. Fish sauce. Oyster sauce. Cocktail sauce. Horseradish that you find on the shelf. Ketchup. Mustard. Meat flavorings and tenderizers. Bouillon cubes. Hot sauce and Tabasco sauce. Premade or packaged marinades. Premade or packaged taco seasonings. Relishes. Regular salad dressings. Where to find more information:  National Heart, Lung, and [place]: https://wilson-eaton.com/  American Heart Association: www.heart.org Summary  The DASH eating plan is a healthy eating plan that has been shown to reduce high blood pressure (hypertension). It may also reduce your risk for type 2 diabetes, heart disease, and stroke.  With the DASH eating plan, you should limit salt (sodium) intake to 2,300 mg a day. If you have hypertension, you may need to reduce your sodium intake to 1,500 mg a day.  When on the DASH eating plan, aim to eat more fresh fruits and vegetables, whole grains, lean proteins,  low-fat dairy, and heart-healthy fats.  Work with your health care provider or diet and nutrition specialist (dietitian) to adjust your eating plan to your individual calorie needs. This information is not intended to replace advice given to you by your health care provider. Make sure you discuss any questions you have with your health care provider. Document Released: 10/02/2011 Document Revised: 10/06/2016 Document Reviewed: 10/06/2016 Elsevier  Interactive Patient Education  2018 [employer].  Hypertension Hypertension, commonly called high blood pressure, is when the force of blood pumping through the arteries is too strong. The arteries are the blood vessels that carry blood from the heart throughout the body. Hypertension forces the heart to work harder to pump blood and may cause arteries to become narrow or stiff. Having untreated or uncontrolled hypertension can cause heart attacks, strokes, kidney disease, and other problems. A blood pressure reading consists of a higher number over a lower number. Ideally, your blood pressure should be below 120/80. The first ("top") number is called the systolic pressure. It is a measure of the pressure in your arteries as your heart beats. The second ("bottom") number is called the diastolic pressure. It is a measure of the pressure in your arteries as the heart relaxes. What are the causes? The cause of this condition is not known. What increases the risk? Some risk factors for high blood pressure are under your control. Others are not. Factors you can change  Smoking.  Having type 2 diabetes mellitus, high cholesterol, or both.  Not getting enough exercise or physical activity.  Being overweight.  Having too much fat, sugar, calories, or salt (sodium) in your diet.  Drinking too much alcohol. Factors that are difficult or impossible to change  Having chronic kidney disease.  Having a family history of high blood pressure.  Age. Risk increases with age.  Race. You may be at higher risk if you are African-American.  Gender. Men are at higher risk than women before age 56. After age 34, women are at higher risk than men.  Having obstructive sleep apnea.  Stress. What are the signs or symptoms? Extremely high blood pressure (hypertensive crisis) may cause:  Headache.  Anxiety.  Shortness of breath.  Nosebleed.  Nausea and vomiting.  Severe chest pain.  Jerky  movements you cannot control (seizures).  How is this diagnosed? This condition is diagnosed by measuring your blood pressure while you are seated, with your arm resting on a surface. The cuff of the blood pressure monitor will be placed directly against the skin of your upper arm at the level of your heart. It should be measured at least twice using the same arm. Certain conditions can cause a difference in blood pressure between your right and left arms. Certain factors can cause blood pressure readings to be lower or higher than normal (elevated) for a short period of time:  When your blood pressure is higher when you are in a health care provider's office than when you are at home, this is called white coat hypertension. Most people with this condition do not need medicines.  When your blood pressure is higher at home than when you are in a health care provider's office, this is called masked hypertension. Most people with this condition may need medicines to control blood pressure.  If you have a high blood pressure reading during one visit or you have normal blood pressure with other risk factors:  You may be asked to return on a different day to have  your blood pressure checked again.  You may be asked to monitor your blood pressure at home for 1 week or longer.  If you are diagnosed with hypertension, you may have other blood or imaging tests to help your health care provider understand your overall risk for other conditions. How is this treated? This condition is treated by making healthy lifestyle changes, such as eating healthy foods, exercising more, and reducing your alcohol intake. Your health care provider may prescribe medicine if lifestyle changes are not enough to get your blood pressure under control, and if:  Your systolic blood pressure is above 130.  Your diastolic blood pressure is above 80.  Your personal target blood pressure may vary depending on your medical  conditions, your age, and other factors. Follow these instructions at home: Eating and drinking  Eat a diet that is high in fiber and potassium, and low in sodium, added sugar, and fat. An example eating plan is called the DASH (Dietary Approaches to Stop Hypertension) diet. To eat this way: ? Eat plenty of fresh fruits and vegetables. Try to fill half of your plate at each meal with fruits and vegetables. ? Eat whole grains, such as whole wheat pasta, brown rice, or whole grain bread. Fill about one quarter of your plate with whole grains. ? Eat or drink low-fat dairy products, such as skim milk or low-fat yogurt. ? Avoid fatty cuts of meat, processed or cured meats, and poultry with skin. Fill about one quarter of your plate with lean proteins, such as fish, chicken without skin, beans, eggs, and tofu. ? Avoid premade and processed foods. These tend to be higher in sodium, added sugar, and fat.  Reduce your daily sodium intake. Most people with hypertension should eat less than 1,500 mg of sodium a day.  Limit alcohol intake to no more than 1 drink a day for nonpregnant women and 2 drinks a day for men. One drink equals 12 oz of beer, 5 oz of wine, or 1 oz of hard liquor. Lifestyle  Work with your health care provider to maintain a healthy body weight or to lose weight. Ask what an ideal weight is for you.  Get at least 30 minutes of exercise that causes your heart to beat faster (aerobic exercise) most days of the week. Activities may include walking, swimming, or biking.  Include exercise to strengthen your muscles (resistance exercise), such as pilates or lifting weights, as part of your weekly exercise routine. Try to do these types of exercises for 30 minutes at least 3 days a week.  Do not use any products that contain nicotine or tobacco, such as cigarettes and e-cigarettes. If you need help quitting, ask your health care provider.  Monitor your blood pressure at home as told by  your health care provider.  Keep all follow-up visits as told by your health care provider. This is important. Medicines  Take over-the-counter and prescription medicines only as told by your health care provider. Follow directions carefully. Blood pressure medicines must be taken as prescribed.  Do not skip doses of blood pressure medicine. Doing this puts you at risk for problems and can make the medicine less effective.  Ask your health care provider about side effects or reactions to medicines that you should watch for. Contact a health care provider if:  You think you are having a reaction to a medicine you are taking.  You have headaches that keep coming back (recurring).  You feel dizzy.  You  have swelling in your ankles.  You have trouble with your vision. Get help right away if:  You develop a severe headache or confusion.  You have unusual weakness or numbness.  You feel faint.  You have severe pain in your chest or abdomen.  You vomit repeatedly.  You have trouble breathing. Summary  Hypertension is when the force of blood pumping through your arteries is too strong. If this condition is not controlled, it may put you at risk for serious complications.  Your personal target blood pressure may vary depending on your medical conditions, your age, and other factors. For most people, a normal blood pressure is less than 120/80.  Hypertension is treated with lifestyle changes, medicines, or a combination of both. Lifestyle changes include weight loss, eating a healthy, low-sodium diet, exercising more, and limiting alcohol. This information is not intended to replace advice given to you by your health care provider. Make sure you discuss any questions you have with your health care provider. Document Released: 10/13/2005 Document Revised: 09/10/2016 Document Reviewed: 09/10/2016 Elsevier Interactive Patient Education  [employer].

## 2018-03-17 ENCOUNTER — Other Ambulatory Visit (INDEPENDENT_AMBULATORY_CARE_PROVIDER_SITE_OTHER): Payer: BLUE CROSS/BLUE SHIELD

## 2018-03-17 DIAGNOSIS — Z0184 Encounter for antibody response examination: Secondary | ICD-10-CM

## 2018-03-17 DIAGNOSIS — Z1389 Encounter for screening for other disorder: Secondary | ICD-10-CM | POA: Diagnosis not present

## 2018-03-17 DIAGNOSIS — E559 Vitamin D deficiency, unspecified: Secondary | ICD-10-CM

## 2018-03-17 DIAGNOSIS — Z1159 Encounter for screening for other viral diseases: Secondary | ICD-10-CM

## 2018-03-17 DIAGNOSIS — Z1329 Encounter for screening for other suspected endocrine disorder: Secondary | ICD-10-CM

## 2018-03-17 DIAGNOSIS — R030 Elevated blood-pressure reading, without diagnosis of hypertension: Secondary | ICD-10-CM

## 2018-03-17 DIAGNOSIS — R7303 Prediabetes: Secondary | ICD-10-CM

## 2018-03-17 LAB — URINALYSIS, ROUTINE W REFLEX MICROSCOPIC
Bilirubin Urine: NEGATIVE
Hgb urine dipstick: NEGATIVE
Ketones, ur: NEGATIVE
Leukocytes, UA: NEGATIVE
Nitrite: NEGATIVE
RBC / HPF: NONE SEEN (ref 0–?)
Specific Gravity, Urine: 1.01 (ref 1.000–1.030)
Total Protein, Urine: NEGATIVE
Urine Glucose: NEGATIVE
Urobilinogen, UA: 0.2 (ref 0.0–1.0)
pH: 6 (ref 5.0–8.0)

## 2018-03-17 NOTE — Addendum Note (Signed)
Addended by: Penne Lash on: 03/17/2018 10:36 AM   Modules accepted: Orders

## 2018-03-17 NOTE — Addendum Note (Signed)
Addended by: WIGGINS, Cheree Fowles N on: 03/17/2018 10:36 AM   Modules accepted: Orders  

## 2018-03-18 LAB — COMPREHENSIVE METABOLIC PANEL
AG Ratio: 1.5 (calc) (ref 1.0–2.5)
ALT: 30 U/L — ABNORMAL HIGH (ref 6–29)
AST: 23 U/L (ref 10–30)
Albumin: 4.1 g/dL (ref 3.6–5.1)
Alkaline phosphatase (APISO): 94 U/L (ref 33–115)
BUN: 9 mg/dL (ref 7–25)
CO2: 26 mmol/L (ref 20–32)
Calcium: 9 mg/dL (ref 8.6–10.2)
Chloride: 102 mmol/L (ref 98–110)
Creat: 0.64 mg/dL (ref 0.50–1.10)
Globulin: 2.7 g/dL (calc) (ref 1.9–3.7)
Glucose, Bld: 87 mg/dL (ref 65–99)
Potassium: 4.3 mmol/L (ref 3.5–5.3)
Sodium: 138 mmol/L (ref 135–146)
Total Bilirubin: 0.3 mg/dL (ref 0.2–1.2)
Total Protein: 6.8 g/dL (ref 6.1–8.1)

## 2018-03-18 LAB — CBC WITH DIFFERENTIAL/PLATELET
Basophils Absolute: 20 cells/uL (ref 0–200)
Basophils Relative: 0.3 %
Eosinophils Absolute: 99 cells/uL (ref 15–500)
Eosinophils Relative: 1.5 %
HCT: 29.6 % — ABNORMAL LOW (ref 35.0–45.0)
Hemoglobin: 9.4 g/dL — ABNORMAL LOW (ref 11.7–15.5)
Lymphs Abs: 1855 cells/uL (ref 850–3900)
MCH: 24.4 pg — ABNORMAL LOW (ref 27.0–33.0)
MCHC: 31.8 g/dL — ABNORMAL LOW (ref 32.0–36.0)
MCV: 76.7 fL — ABNORMAL LOW (ref 80.0–100.0)
MPV: 9.3 fL (ref 7.5–12.5)
Monocytes Relative: 6.5 %
Neutro Abs: 4198 cells/uL (ref 1500–7800)
Neutrophils Relative %: 63.6 %
Platelets: 362 10*3/uL (ref 140–400)
RBC: 3.86 10*6/uL (ref 3.80–5.10)
RDW: 15.4 % — ABNORMAL HIGH (ref 11.0–15.0)
Total Lymphocyte: 28.1 %
WBC mixed population: 429 cells/uL (ref 200–950)
WBC: 6.6 10*3/uL (ref 3.8–10.8)

## 2018-03-18 LAB — HEPATITIS B SURFACE ANTIBODY, QUANTITATIVE: Hepatitis B-Post: 16 m[IU]/mL (ref >=10)

## 2018-03-18 LAB — TSH: TSH: 1.17 mIU/L

## 2018-03-18 LAB — T4, FREE: Free T4: 1 ng/dL (ref 0.8–1.8)

## 2018-03-18 LAB — HEMOGLOBIN A1C
Hgb A1c MFr Bld: 6.1 % of total Hgb — ABNORMAL HIGH (ref <5.7)
Mean Plasma Glucose: 128 (calc)
eAG (mmol/L): 7.1 (calc)

## 2018-03-18 LAB — LIPID PANEL
Cholesterol: 202 mg/dL — ABNORMAL HIGH (ref <200)
HDL: 67 mg/dL (ref >50)
LDL Cholesterol (Calc): 121 mg/dL (calc) — ABNORMAL HIGH
Non-HDL Cholesterol (Calc): 135 mg/dL (calc) — ABNORMAL HIGH (ref <130)
Total CHOL/HDL Ratio: 3 (calc) (ref <5.0)
Triglycerides: 53 mg/dL (ref <150)

## 2018-03-18 LAB — MEASLES/MUMPS/RUBELLA IMMUNITY
Mumps IgG: >300 AU/mL
Rubella: 5.3 index
Rubeola IgG: 73.4 AU/mL

## 2018-03-18 LAB — VITAMIN D 25 HYDROXY (VIT D DEFICIENCY, FRACTURES): Vit D, 25-Hydroxy: 25 ng/mL — ABNORMAL LOW (ref 30–100)

## 2018-03-23 ENCOUNTER — Ambulatory Visit (INDEPENDENT_AMBULATORY_CARE_PROVIDER_SITE_OTHER): Payer: BLUE CROSS/BLUE SHIELD | Admitting: Certified Nurse Midwife

## 2018-03-23 ENCOUNTER — Encounter: Payer: Self-pay | Admitting: Certified Nurse Midwife

## 2018-03-23 VITALS — BP 160/90 | HR 108 | Ht 66.0 in | Wt 237.0 lb

## 2018-03-23 DIAGNOSIS — Z0000 Encounter for general adult medical examination without abnormal findings: Secondary | ICD-10-CM | POA: Diagnosis not present

## 2018-03-23 DIAGNOSIS — R7989 Other specified abnormal findings of blood chemistry: Secondary | ICD-10-CM | POA: Insufficient documentation

## 2018-03-23 DIAGNOSIS — D509 Iron deficiency anemia, unspecified: Secondary | ICD-10-CM | POA: Insufficient documentation

## 2018-03-23 DIAGNOSIS — Z124 Encounter for screening for malignant neoplasm of cervix: Secondary | ICD-10-CM | POA: Diagnosis not present

## 2018-03-23 DIAGNOSIS — N920 Excessive and frequent menstruation with regular cycle: Secondary | ICD-10-CM | POA: Diagnosis not present

## 2018-03-23 DIAGNOSIS — Z01419 Encounter for gynecological examination (general) (routine) without abnormal findings: Secondary | ICD-10-CM | POA: Diagnosis not present

## 2018-03-23 NOTE — Progress Notes (Signed)
Gynecology Annual Exam  PCP: [name], Pasty Spillers, MD  Chief Complaint:  Chief Complaint  Patient presents with  . Gynecologic Exam    History of Present Illness: Kathleen Burke is a 42 year old African American/Black female, G2 P2002, who presents for her annual exam. She has no new gyn problems. She is anemic and has been restarted on iron supplements when her hemoglobin was 9.4 gm/dl this month..  She has had heavier menses this past year and have two days where she has to wear "pullups" to sleep and change her pad and tampon every hour during the day.  Her menses have been regular. LMP 03/02/2018. They occur every 1 month , they last 5-6 days.   She has had no intermenstrual  spotting.  She reports dysmenorrhea  thru the first 1-2days of her menses. She uses ibuprofen or Tylenol with symptomatic relief.  The patient's past medical history is notable for obesity (Current BMI 38.25), anxiety (on Effexor since 2014),  menorrhagia (had an endometrial biopsy in 2014 which was B9), labile blood pressures, AGUS Pap in 2014 ( colpo and ECC negative) and a family history of of hypertension, hypercholesterolemia, and diabetes..  Since her last annual exam 02/27/2017, she also had a low vitamin D level (25) and an elevated hemoglobin A1C to 6.1%. She is preparing for her stepdaughter's wedding this coming weekend and just returned from [place] where she celebrated her 20th wedding anniversary  She is sexually active. She is currently using a vasectomy for contraception.  Her most recent pap smear was obtained 02/27/2017 and was NIL. Her most recent mammogram obtained 04/08/2017 was normal.  There is a positive history of breast cancer in her maternal grandmother. Genetic testing has not been done.  There is no family history of ovarian cancer.  The patient does do monthly self breast exams.  The patient does not smoke.  The patient does drink 2 glasses of wine on the weekends.  The patient  does not use illegal drugs.  The patient has not been exercising, but is active at work and at home The patient does get adequate calcium in her diet.  She had a recent cholesterol screen in 2019 that was borderline.  Review of Systems: Review of Systems  Constitutional: Negative for chills, fever and weight loss.  HENT: Negative for congestion, sinus pain and sore throat.   Eyes: Negative for blurred vision and pain.  Respiratory: Negative for hemoptysis, shortness of breath and wheezing.   Cardiovascular: Negative for chest pain, palpitations and leg swelling.  Gastrointestinal: Negative for abdominal pain, blood in stool, diarrhea, heartburn, nausea and vomiting.  Genitourinary: Negative for dysuria, frequency, hematuria and urgency.       Positive for menorrhagia  Musculoskeletal: Negative for back pain, joint pain and myalgias.       Right hand pain from arthritis  Skin: Negative for itching and rash.  Neurological: Negative for dizziness, tingling and headaches.  Endo/Heme/Allergies: Negative for environmental allergies and polydipsia. Does not bruise/bleed easily.       Negative for hirsutism   Psychiatric/Behavioral: Negative for depression. The patient is not nervous/anxious and does not have insomnia.     Past Medical History:  Past Medical History:  Diagnosis Date  . Anxiety 2014  . Chicken pox   . Depression   . Frequent headaches   . Hypertension   . Obesity (BMI 30-39.9)     Past Surgical History:  Past Surgical History:  Procedure Laterality  Date  . WISDOM TOOTH EXTRACTION        Family History:  Family History  Problem Relation Age of Onset  . Arthritis Mother   . Hyperlipidemia Mother   . Hypertension Mother   . Diabetes Father        DM I  . Breast cancer Maternal Grandmother 50  . Arthritis Paternal Grandmother   . Diabetes Mellitus II Paternal Grandmother   . Arthritis Paternal Grandfather   . Hyperlipidemia Paternal Grandfather   . Stroke  Paternal Grandfather   . Hypertension Paternal Grandfather   . Diabetes Mellitus II Sister   . Diabetes Mellitus II Sister   . Hyperlipidemia Sister   . Hypertension Sister     Social History:  Social History   Socioeconomic History  . Marital status: Married    Spouse name: Not on file  . Number of children: 2  . Years of education: Not on file  . Highest education level: Not on file  Occupational History  . Occupation: Charity fundraiser  Social Needs  . Financial resource strain: Not on file  . Food insecurity:    Worry: Not on file    Inability: Not on file  . Transportation needs:    Medical: Not on file    Non-medical: Not on file  Tobacco Use  . Smoking status: Never Smoker  . Smokeless tobacco: Never Used  Substance and Sexual Activity  . Alcohol use: Yes    Alcohol/week: 1.2 oz    Types: 2 Glasses of wine per week  . Drug use: No  . Sexual activity: Yes    Partners: Male    Birth control/protection: Surgical    Comment: vasectomy  Lifestyle  . Physical activity:    Days per week: Not on file    Minutes per session: Not on file  . Stress: Not on file  Relationships  . Social connections:    Talks on phone: Not on file    Gets together: Not on file    Attends religious service: Not on file    Active member of club or organization: Not on file    Attends meetings of clubs or organizations: Not on file    Relationship status: Not on file  . Intimate partner violence:    Fear of current or ex partner: Not on file    Emotionally abused: Not on file    Physically abused: Not on file    Forced sexual activity: Not on file  Other Topics Concern  . Not on file  Social History Narrative   Married    2 kids    Works occup. [employer]     Allergies:  No Known Allergies  Medications: Aleve prn and Current Outpatient Medications on File Prior to Visit  Medication Sig Dispense Refill  . Cholecalciferol (VITAMIN D3) 5000 units CAPS Take by mouth.    . diazepam  (VALIUM) 5 MG tablet 1 tablet once; 1 hour prior to flying. 5 tablet 0  . Ferrous Sulfate (IRON) 325 (65 Fe) MG TABS Take by mouth.    . venlafaxine XR ([name]) 37.5 MG 24 hr capsule Take 1 capsule (37.5 mg total) by mouth daily. 90 capsule 3   No current facility-administered medications on file prior to visit.   Physical Exam Vitals: BP (!) 160/90   Pulse (!) 108   Ht  (1.676 m)   Wt 237 lb (107.5 kg)   LMP 03/02/2018 (Exact Date)   BMI 38.25 kg/m  General: pleasant BF, in NAD HEENT: normocephalic, anicteric Thyroid: no enlargement, no palpable nodules  Pulmonary: No increased work of breathing, CTAB Cardiovascular: RRR with Grade II/VI systolic murmur at the aortic area  Breast: Breast asymmetrical (RT larger than LT), no tenderness, no palpable nodules or masses, no skin or nipple retraction present, no nipple discharge.  No axillary, infraclavicular, or supraclavicular lymphadenopathy. Abdomen: obese, soft, non-tender, non-distended.  Umbilicus without lesions.  No hepatomegaly or masses palpable. No evidence of hernia  Genitourinar  External: Normal external female genitalia.  Normal urethral meatus, normal Bartholin's and Skene's glands.    Vagina: Normal vaginal mucosa, no evidence of prolapse.    Cervix: Grossly normal in appearance, no bleeding  Uterus: RF, decreased mobility, TLNS, NT  Adnexa: no adnexal masses, NT  Rectal: deferred  Lymphatic: no evidence of inguinal lymphadenopathy Extremities: no edema, erythema, or tenderness Neurologic: Grossly intact Psychiatric: mood appropriate, affect full      Assessment/Plan: 42 y.o. [id number] annual gyn exam\  Menorrhagia and anemia-unable to fully evaluate uterus due to being retroflexed. Will get pelvic ultrasound to evaluate endometrium. Follow up after pelvic ultrasound   Breast cancer screening: Mammogram - recommend yearly screening mammogram.  Mammogram Was already ordered by PCP. Patient to schedule  appointment  Cervical cancer screening: Pap done  Routine healthcare maintenance including cholesterol, diabetes screening  managed by PCP. Encouraged diet and exercise for weight loss.  Elevated blood pressures-has appointment with PCP for another blood pressure check. Will recheck also at her pelvic ultrasound appointment.    Farrel Conners, CNM

## 2018-03-26 LAB — IGP,RFX APTIMA HPV ALL PTH: PAP Smear Comment: 0

## 2018-04-07 ENCOUNTER — Ambulatory Visit (INDEPENDENT_AMBULATORY_CARE_PROVIDER_SITE_OTHER): Payer: BLUE CROSS/BLUE SHIELD

## 2018-04-07 ENCOUNTER — Ambulatory Visit (INDEPENDENT_AMBULATORY_CARE_PROVIDER_SITE_OTHER): Payer: BLUE CROSS/BLUE SHIELD | Admitting: Certified Nurse Midwife

## 2018-04-07 ENCOUNTER — Encounter: Payer: Self-pay | Admitting: Certified Nurse Midwife

## 2018-04-07 VITALS — BP 164/94 | HR 87 | Ht 66.0 in | Wt 237.0 lb

## 2018-04-07 DIAGNOSIS — R030 Elevated blood-pressure reading, without diagnosis of hypertension: Secondary | ICD-10-CM

## 2018-04-07 DIAGNOSIS — N920 Excessive and frequent menstruation with regular cycle: Secondary | ICD-10-CM

## 2018-04-07 NOTE — Progress Notes (Signed)
  EXB:[id number]:Kathleen Burke is a 42 year old African American/Black female, G2 P2002, who presents for a follow up after her pelvic ultrasound. She has no new gyn problems. She is anemic and has been restarted on iron supplements when her hemoglobin was 9.4 gm/dl this month..  She has had heavier menses this past year and have two days where she has to wear "pullups" to sleep and change her pad and tampon every hour during the day.  The patient's past medical history is notable for obesity (Current BMI 38.25), anxiety (on Effexor since 2014),  menorrhagia (had an endometrial biopsy in 2014 which was B9), labile blood pressures, AGUS Pap in 2014 ( colpo and ECC negative) and a family history of of hypertension, hypercholesterolemia, and diabetes.Marland Kitchen.    Ultrasound demonstrates a thickened endometrium at 17.236mm (menses due, LMP 03/02/2018), normal ovaries and a possible endometrial polyp (1.29 x 0.58 cm).   PMHx: She  has a past medical history of Anxiety (2014), Chicken pox, Depression, Frequent headaches, Hypertension, and Obesity (BMI 30-39.9). Also,  has a past surgical history that includes Wisdom tooth extraction., family history includes Arthritis in her mother, paternal grandfather, and paternal grandmother; Breast cancer (age of onset: 2550) in her maternal grandmother; Diabetes Mellitus I in her father; Diabetes Mellitus II in her paternal grandmother, sister, and sister; Hyperlipidemia in her mother, paternal grandfather, and sister; Hypertension in her mother, paternal grandfather, and sister; Stroke in her paternal grandfather.,  reports that she has never smoked. She has never used smokeless tobacco. She reports that she drinks about 1.2 oz of alcohol per week. She reports that she does not use drugs.  She has a current medication list which includes the following prescription(s): vitamin d3, diazepam, iron, and venlafaxine xr. Also, has No Known Allergies.  ROS  Objective: BP (!) 164/94   Pulse 87    Ht 5\' 6"  (1.676 m)   Wt 237 lb (107.5 kg)   LMP 03/02/2018 (Exact Date)   BMI 38.25 kg/m   Physical examination Constitutional NAD, pleasant BF, Conversant     Extremities: Moves all appropriately.  Normal ROM for age.  Neuro: Grossly intact  Psych: Oriented to PPT.  Normal mood. Normal affect.   Assessment: Menorrhagia Possible endometria polyp Elevated blood pressure  Plan: Discussed possible need for hysteroscopy and polypectomy/ D&C Also discussed medical and surgical treatments for menorrhagia to include progestins, Depo, progestin containing IUDs, ablations and hysterectomy. Given handout on these options/treatments.  Refer to Dr Bonney AidStaebler for further evaluation and treatment. Also discussed with patient blood pressure elevations (160/90 at last visit) and encouraged her to see her PCP sooner than the end of August for treatment of hypertension. Spent 15 min in face to face discussion of ultrasound findings and further evaluation and treatment of menorrhagia. Farrel Connersolleen Jurline Folger, CNM

## 2018-04-07 NOTE — Patient Instructions (Signed)
Menorrhagia Menorrhagia is a condition in which menstrual periods are heavy or last longer than normal. With menorrhagia, most periods a woman has may cause enough blood loss and cramping that she becomes unable to take part in her usual activities. What are the causes? Common causes of this condition include:  Noncancerous growths in the uterus (polyps or fibroids).  An imbalance of the estrogen and progesterone hormones.  One of the ovaries not releasing an egg during one or more months.  A problem with the thyroid gland (hypothyroid).  Side effects of having an intrauterine device (IUD).  Side effects of some medicines, such as anti-inflammatory medicines or blood thinners.  A bleeding disorder that stops the blood from clotting normally.  In some cases, the cause of this condition is not known. What are the signs or symptoms? Symptoms of this condition include:  Routinely having to change your pad or tampon every 1-2 hours because it is completely soaked.  Needing to use pads and tampons at the same time because of heavy bleeding.  Needing to wake up to change your pads or tampons during the night.  Passing blood clots larger than 1 inch (2.5 cm) in size.  Having bleeding that lasts for more than 7 days.  Having symptoms of low iron levels (anemia), such as tiredness, fatigue, or shortness of breath.  How is this diagnosed? This condition may be diagnosed based on:  A physical exam.  Your symptoms and menstrual history.  Tests, such as: ? Blood tests to check if you are pregnant or have hormonal changes, a bleeding or thyroid disorder, anemia, or other problems. ? Pap test to check for cancerous changes, infections, or inflammation. ? Endometrial biopsy. This test involves removing a tissue sample from the lining of the uterus (endometrium) to be examined under a microscope. ? Pelvic ultrasound. This test uses sound waves to create images of your uterus, ovaries, and  vagina. The images can show if you have fibroids or other growths. ? Hysteroscopy. For this test, a small telescope is used to look inside your uterus.  How is this treated? Treatment may not be needed for this condition. If it is needed, the best treatment for you will depend on:  Whether you need to prevent pregnancy.  Your desire to have children in the future.  The cause and severity of your bleeding.  Your personal preference.  Medicines are the first step in treatment. You may be treated with:  Hormonal birth control methods. These treatments reduce bleeding during your menstrual period. They include: ? Birth control pills. ? Skin patch. ? Vaginal ring. ? Shots (injections) that you get every 3 months. ? Hormonal IUD (intrauterine device). ? Implants that go under the skin.  Medicines that thicken blood and slow bleeding.  Medicines that reduce swelling, such as ibuprofen.  Medicines that contain an artificial (synthetic) hormone called progestin.  Medicines that make the ovaries stop working for a short time.  Iron supplements to treat anemia.  If medicines do not work, surgery may be done. Surgical options may include:  Dilation and curettage (D&C). In this procedure, your health care provider opens (dilates) your cervix and then scrapes or suctions tissue from the endometrium to reduce menstrual bleeding.  Operative hysteroscopy. In this procedure, a small tube with a light on the end (hysteroscope) is used to view your uterus and help remove polyps that may be causing heavy periods.  Endometrial ablation. This is when various techniques are used to permanently   destroy your entire endometrium. After endometrial ablation, most women have little or no menstrual flow. This procedure reduces your ability to become pregnant.  Endometrial resection. In this procedure, an electrosurgical wire loop is used to remove the endometrium. This procedure reduces your ability to  become pregnant.  Hysterectomy. This is surgical removal of the uterus. This is a permanent procedure that stops menstrual periods. Pregnancy is not possible after a hysterectomy.  Follow these instructions at home: Medicines  Take over-the-counter and prescription medicines exactly as told by your health care provider. This includes iron pills.  Do not change or switch medicines without asking your health care provider.  Do not take aspirin or medicines that contain aspirin 1 week before or during your menstrual period. Aspirin may make bleeding worse. General instructions  If you need to change your sanitary pad or tampon more than once every 2 hours, limit your activity until the bleeding stops.  Iron pills can cause constipation. To prevent or treat constipation while you are taking prescription iron supplements, your health care provider may recommend that you: ? Drink enough fluid to keep your urine clear or pale yellow. ? Take over-the-counter or prescription medicines. ? Eat foods that are high in fiber, such as fresh fruits and vegetables, whole grains, and beans. ? Limit foods that are high in fat and processed sugars, such as fried and sweet foods.  Eat well-balanced meals, including foods that are high in iron. Foods that have a lot of iron include leafy green vegetables, meat, liver, eggs, and whole grain breads and cereals.  Do not try to lose weight until the abnormal bleeding has stopped and your blood iron level is back to normal. If you need to lose weight, work with your health care provider to lose weight safely.  Keep all follow-up visits as told by your health care provider. This is important. Contact a health care provider if:  You soak through a pad or tampon every 1 or 2 hours, and this happens every time you have a period.  You need to use pads and tampons at the same time because you are bleeding so much.  You have nausea, vomiting, diarrhea, or other  problems related to medicines you are taking. Get help right away if:  You soak through more than a pad or tampon in 1 hour.  You pass clots bigger than 1 inch (2.5 cm) wide.  You feel short of breath.  You feel like your heart is beating too fast.  You feel dizzy or faint.  You feel very weak or tired. Summary  Menorrhagia is a condition in which menstrual periods are heavy or last longer than normal.  Treatment will depend on the cause of the condition and may include medicines or procedures.  Take over-the-counter and prescription medicines exactly as told by your health care provider. This includes iron pills.  Get help right away if you have heavy bleeding that soaks through more than a pad or tampon in 1 hour, you are passing large clots, or you feel dizzy, faint or short of breath. This information is not intended to replace advice given to you by your health care provider. Make sure you discuss any questions you have with your health care provider. Document Released: 10/13/2005 Document Revised: 10/06/2016 Document Reviewed: 10/06/2016 Elsevier Interactive Patient Education  2018 [employer]  Endometrial Ablation Endometrial ablation is a procedure that destroys the thin inner layer of the lining of the uterus (endometrium). This procedure may  be done:  To stop heavy periods.  To stop bleeding that is causing anemia.  To control irregular bleeding.  To treat bleeding caused by small tumors (fibroids) in the endometrium.  This procedure is often an alternative to major surgery, such as removal of the uterus and cervix (hysterectomy). As a result of this procedure:  You may not be able to have children. However, if you are premenopausal (you have not gone through menopause): ? You may still have a small chance of getting pregnant. ? You will need to use a reliable method of birth control after the procedure to prevent pregnancy.  You may stop having a menstrual  period, or you may have only a small amount of bleeding during your period. Menstruation may return several years after the procedure.  Tell a health care provider about:  Any allergies you have.  All medicines you are taking, including vitamins, herbs, eye drops, creams, and over-the-counter medicines.  Any problems you or family members have had with the use of anesthetic medicines.  Any blood disorders you have.  Any surgeries you have had.  Any medical conditions you have. What are the risks? Generally, this is a safe procedure. However, problems may occur, including:  A hole (perforation) in the uterus or bowel.  Infection of the uterus, bladder, or vagina.  Bleeding.  Damage to other structures or organs.  An air bubble in the lung (air embolus).  Problems with pregnancy after the procedure.  Failure of the procedure.  Decreased ability to diagnose cancer in the endometrium.  What happens before the procedure?  You will have tests of your endometrium to make sure there are no pre-cancerous cells or cancer cells present.  You may have an ultrasound of the uterus.  You may be given medicines to thin the endometrium.  Ask your health care provider about: ? Changing or stopping your regular medicines. This is especially important if you take diabetes medicines or blood thinners. ? Taking medicines such as aspirin and ibuprofen. These medicines can thin your blood. Do not take these medicines before your procedure if your doctor tells you not to.  Plan to have someone take you home from the hospital or clinic. What happens during the procedure?  You will lie on an exam table with your feet and legs supported as in a pelvic exam.  To lower your risk of infection: ? Your health care team will wash or sanitize their hands and put on germ-free (sterile) gloves. ? Your genital area will be washed with soap.  An IV tube will be inserted into one of your veins.  You  will be given a medicine to help you relax (sedative).  A surgical instrument with a light and camera (resectoscope) will be inserted into your vagina and moved into your uterus. This allows your surgeon to see inside your uterus.  Endometrial tissue will be removed using one of the following methods: ? Radiofrequency. This method uses a radiofrequency-alternating electric current to remove the endometrium. ? Cryotherapy. This method uses extreme cold to freeze the endometrium. ? Heated-free liquid. This method uses a heated saltwater (saline) solution to remove the endometrium. ? Microwave. This method uses high-energy microwaves to heat up the endometrium and remove it. ? Thermal balloon. This method involves inserting a catheter with a balloon tip into the uterus. The balloon tip is filled with heated fluid to remove the endometrium. The procedure may vary among health care providers and hospitals. What happens after the  procedure?  Your blood pressure, heart rate, breathing rate, and blood oxygen level will be monitored until the medicines you were given have worn off.  As tissue healing occurs, you may notice vaginal bleeding for 4-6 weeks after the procedure. You may also experience: ? Cramps. ? Thin, watery vaginal discharge that is light pink or brown in color. ? A need to urinate more frequently than usual. ? Nausea.  Do not drive for 24 hours if you were given a sedative.  Do not have sex or insert anything into your vagina until your health care provider approves. Summary  Endometrial ablation is done to treat the many causes of heavy menstrual bleeding.  The procedure may be done only after medications have been tried to control the bleeding.  Plan to have someone take you home from the hospital or clinic. This information is not intended to replace advice given to you by your health care provider. Make sure you discuss any questions you have with your health care  provider. Document Released: 08/22/2004 Document Revised: 10/30/2016 Document Reviewed: 10/30/2016 Elsevier Interactive Patient Education  2017 [employer] Hysteroscopy Hysteroscopy is a procedure used for looking inside the womb (uterus). It may be done for various reasons, including:  To evaluate abnormal bleeding, fibroid (benign, noncancerous) tumors, polyps, scar tissue (adhesions), and possibly cancer of the uterus.  To look for lumps (tumors) and other uterine growths.  To look for causes of why a woman cannot get pregnant (infertility), causes of recurrent loss of pregnancy (miscarriages), or a lost intrauterine device (IUD).  To perform a sterilization by blocking the fallopian tubes from inside the uterus.  In this procedure, a thin, flexible tube with a tiny light and camera on the end of it (hysteroscope) is used to look inside the uterus. A hysteroscopy should be done right after a menstrual period to be sure you are not pregnant. LET [facility] PROVIDER KNOW ABOUT:  Any allergies you have.  All medicines you are taking, including vitamins, herbs, eye drops, creams, and over-the-counter medicines.  Previous problems you or members of your family have had with the use of anesthetics.  Any blood disorders you have.  Previous surgeries you have had.  Medical conditions you have. RISKS AND COMPLICATIONS Generally, this is a safe procedure. However, as with any procedure, complications can occur. Possible complications include:  Putting a hole in the uterus.  Excessive bleeding.  Infection.  Damage to the cervix.  Injury to other organs.  Allergic reaction to medicines.  Too much fluid used in the uterus for the procedure.  BEFORE THE PROCEDURE  Ask your health care provider about changing or stopping any regular medicines.  Do not take aspirin or blood thinners for 1 week before the procedure, or as directed by your health care provider. These can  cause bleeding.  If you smoke, do not smoke for 2 weeks before the procedure.  In some cases, a medicine is placed in the cervix the day before the procedure. This medicine makes the cervix have a larger opening (dilate). This makes it easier for the instrument to be inserted into the uterus during the procedure.  Do not eat or drink anything for at least 8 hours before the surgery.  Arrange for someone to take you home after the procedure. PROCEDURE  You may be given a medicine to relax you (sedative). You may also be given one of the following: ? A medicine that numbs the area around the cervix (local  anesthetic). ? A medicine that makes you sleep through the procedure (general anesthetic).  The hysteroscope is inserted through the vagina into the uterus. The camera on the hysteroscope sends a picture to a TV screen. This gives the surgeon a good view inside the uterus.  During the procedure, air or a liquid is put into the uterus, which allows the surgeon to see better.  Sometimes, tissue is gently scraped from inside the uterus. These tissue samples are sent to a lab for testing. What to expect after the procedure  If you had a general anesthetic, you may be groggy for a couple hours after the procedure.  If you had a local anesthetic, you will be able to go home as soon as you are stable and feel ready.  You may have some cramping. This normally lasts for a couple days.  You may have bleeding, which varies from light spotting for a few days to menstrual-like bleeding for 3-7 days. This is normal.  If your test results are not back during the visit, make an appointment with your health care provider to find out the results. This information is not intended to replace advice given to you by your health care provider. Make sure you discuss any questions you have with your health care provider. Document Released: 01/19/2001 Document Revised: 03/20/2016 Document Reviewed:  05/12/2013 Elsevier Interactive Patient Education  2017 [employer].

## 2018-04-08 ENCOUNTER — Encounter: Payer: Self-pay | Admitting: Internal Medicine

## 2018-04-09 ENCOUNTER — Other Ambulatory Visit: Payer: Self-pay | Admitting: Internal Medicine

## 2018-04-09 DIAGNOSIS — I10 Essential (primary) hypertension: Secondary | ICD-10-CM

## 2018-04-09 MED ORDER — HYDROCHLOROTHIAZIDE 25 MG PO TABS: ORAL_TABLET | ORAL | 0 refills | Status: DC

## 2018-04-13 ENCOUNTER — Encounter: Payer: Self-pay | Admitting: Obstetrics and Gynecology

## 2018-04-13 ENCOUNTER — Ambulatory Visit (INDEPENDENT_AMBULATORY_CARE_PROVIDER_SITE_OTHER): Payer: BLUE CROSS/BLUE SHIELD | Admitting: Obstetrics and Gynecology

## 2018-04-13 VITALS — BP 134/88 | HR 125 | Ht 66.0 in | Wt 238.0 lb

## 2018-04-13 DIAGNOSIS — N840 Polyp of corpus uteri: Secondary | ICD-10-CM

## 2018-04-13 DIAGNOSIS — N939 Abnormal uterine and vaginal bleeding, unspecified: Secondary | ICD-10-CM

## 2018-04-13 DIAGNOSIS — N920 Excessive and frequent menstruation with regular cycle: Secondary | ICD-10-CM

## 2018-04-13 NOTE — Progress Notes (Signed)
Gynecology Abnormal Uterine Bleeding Initial Evaluation   Chief Complaint:  Chief Complaint  Patient presents with  . Surgery Consult    Referred by CLG    History of Present Illness:    Paitient is a 42 y.o. [id number] who LMP was Patient's last menstrual period was 03/02/2018., presents today for a problem visit.  She complains of menorrhagia that  began worsening in the past year and its severity is described as severe.  Moderate cramping She has used the following for attempts at control: incontinence diappers at it heaviest. The patient is sexually active. She currently uses surgical sterilization for contraception.   Previous evaluation: Ultrasound 04/14/2018  Prior Diagnosis: endometrial polyp. Previous Treatment: none.  Review of records showed normal endometrial biopsy in 2014 but SIS with concern for endometrial polyps.  Review of Systems: Review of Systems  Constitutional: Negative.   Gastrointestinal: Negative.     Past Medical History:  Past Medical History:  Diagnosis Date  . Anxiety 2014  . Chicken pox   . Depression   . Frequent headaches   . Hypertension   . Obesity (BMI 30-39.9)     Past Surgical History:  Past Surgical History:  Procedure Laterality Date  . WISDOM TOOTH EXTRACTION      Obstetric History: [id number]  Family History:  Family History  Problem Relation Age of Onset  . Arthritis Mother   . Hyperlipidemia Mother   . Hypertension Mother   . Diabetes Mellitus I Father        DM I  . Breast cancer Maternal Grandmother 50  . Arthritis Paternal Grandmother   . Diabetes Mellitus II Paternal Grandmother   . Arthritis Paternal Grandfather   . Hyperlipidemia Paternal Grandfather   . Stroke Paternal Grandfather   . Hypertension Paternal Grandfather   . Diabetes Mellitus II Sister   . Diabetes Mellitus II Sister   . Hyperlipidemia Sister   . Hypertension Sister     Social History:  Social History   Socioeconomic History  . Marital  status: Married    Spouse name: Not on file  . Number of children: 2  . Years of education: Not on file  . Highest education level: Not on file  Occupational History  . Occupation: Charity fundraiser  Social Needs  . Financial resource strain: Not on file  . Food insecurity:    Worry: Not on file    Inability: Not on file  . Transportation needs:    Medical: Not on file    Non-medical: Not on file  Tobacco Use  . Smoking status: Never Smoker  . Smokeless tobacco: Never Used  Substance and Sexual Activity  . Alcohol use: Yes    Alcohol/week: 1.2 oz    Types: 2 Glasses of wine per week  . Drug use: No  . Sexual activity: Yes    Partners: Male    Birth control/protection: Surgical    Comment: vasectomy  Lifestyle  . Physical activity:    Days per week: 0 days    Minutes per session: 0 min  . Stress: Not at all  Relationships  . Social connections:    Talks on phone: Not on file    Gets together: Not on file    Attends religious service: Not on file    Active member of club or organization: Not on file    Attends meetings of clubs or organizations: Not on file    Relationship status: Not on file  . Intimate partner  violence:    Fear of current or ex partner: Not on file    Emotionally abused: Not on file    Physically abused: Not on file    Forced sexual activity: Not on file  Other Topics Concern  . Not on file  Social History Narrative   Married    2 kids    Works occup. [employer]     Allergies:  No Known Allergies  Medications: Prior to Admission medications   Medication Sig Start Date End Date Taking? Authorizing Provider  Cholecalciferol (VITAMIN D3) 5000 units CAPS Take by mouth.   Yes [provider]  diazepam (VALIUM) 5 MG tablet 1 tablet once; 1 hour prior to flying. 03/16/18  Yes [name], Pasty Spillers, MD  Ferrous Sulfate (IRON) 325 (65 Fe) MG TABS Take by mouth.   Yes [provider]  hydrochlorothiazide (HYDRODIURIL) 25 MG tablet Take 1/2  tablet for 3 days then increase to 1 tablet daily in the am 04/09/18  Yes [name], Pasty Spillers, MD  venlafaxine XR ([name]) 37.5 MG 24 hr capsule Take 1 capsule (37.5 mg total) by mouth daily. 03/16/18  Yes [name], Pasty Spillers, MD    Physical Exam Blood pressure 134/88, pulse (!) 125, height 5\' 6"  (1.676 m), weight 238 lb (108 kg), last menstrual period 03/02/2018.  Patient's last menstrual period was 03/02/2018.  General: NAD HEENT: normocephalic, anicteric Thyroid: no enlargement, no palpable nodules Pulmonary: No increased work of breathing Cardiovascular: RRR, distal pulses 2+ Abdomen: NABS, soft, non-tender, non-distended.  Umbilicus without lesions.  No hepatomegaly, splenomegaly or masses palpable. No evidence of hernia  Genitourinary:  External: Normal external female genitalia.  Normal urethral meatus, normal Bartholin's and Skene's glands.    Vagina: Normal vaginal mucosa, no evidence of prolapse.    Cervix: Grossly normal in appearance, no bleeding  Uterus: Non-enlarged, mobile, normal contour.  No CMT  Adnexa: ovaries non-enlarged, no adnexal masses  Rectal: deferred  Lymphatic: no evidence of inguinal lymphadenopathy Extremities: no edema, erythema, or tenderness Neurologic: Grossly intact Psychiatric: mood appropriate, affect full  Female chaperone present for pelvic portions of the physical exam  Assessment: 42 y.o. [id number] with abnormal uterine bleeding  Plan: Problem List Items Addressed This Visit    None      1) Discussed management options for abnormal uterine bleeding including expectant, NSAIDs, tranexamic acid (Lysteda), oral progesterone (Provera, norethindrone, megace), Depo Provera, Levonorgestrel containing IUD, endometrial ablation (Novasure) or hysterectomy as definitive surgical management.  Discussed risks and benefits of each method.   Final management decision will hinge on results of patient's work up and whether an underlying etiology  for the patients bleeding symptoms can be discerned.    The risk of endometrial hyperplasia is linearly correlated with increasing BMI given the production of estrone by adipose tissue. Printed patient education handouts were given to the patient to review at home.  Bleeding precautions reviewed. Given focal lesion she is here to discuss D&C with concurrent Mirena IUD placement.  2) I have discussed with the patient the indications for the procedure. Included in the discussion were the options of therapy, as wall as their individual risks, benefits, and complications. Ample time was given to answer all questions.   In office pipelle biopsy generally provides comparable results to [facility], however this sampling modality may miss focal lesions if these were previously documented on ultrasound.  It is because of the potential to miss focal lesions that hysteroscopy D&C is also warranted in patient with continued postmenopausal  bleeding that is not self limited regardless of prior in office biopsy results or ultrasound findings.  She understands that the risk of continued observation include worsening bleeding or worsening of any underlying pathology.  The choices include: 1. Doing nothing but following her symptoms 2. Attempts at hormonal manipulation with either BCP or Depo-Provera for premenopausal patients with no concern for focal lesion or endometrial pathology 3. D&C/hysteroscopy. 4. Endometrial ablation via Novasure or other techniques for premenopausal patients with no concern for focal lesion or endometrial pathology  5. As final resort, hysterectomy. After consideration of her history and findings, mutual decision has been made to proceed with D+C/hysteroscopy. While the incidence is low, the risks from this surgery include, but are not limited to, the risks of anesthesia, hemorrhage, infection, perforation, and injury to adjacent structures including bowel, bladder and blood vessels.   3) A total of 15  minutes were spent in face-to-face contact with the patient during this encounter with over half of that time devoted to counseling and coordination of care.  4) Return if symptoms worsen or fail to improve.   Vena AustriaAndreas Ronnesha Mester, MD, Merlinda FrederickFACOG [facility], [facility]

## 2018-04-14 ENCOUNTER — Telehealth: Payer: Self-pay | Admitting: Obstetrics and Gynecology

## 2018-04-14 NOTE — Telephone Encounter (Signed)
[facility], [facility] @ 9:10am w/ Dr. Bonney AidStaebler, Pre-admit Testing phone interview to be scheduled, and OR on 05/20/18. Patient is aware she may receive [facility] Patient confirmed BCBS, and no secondary insurance. Patient is aware she will need one day off from work, per Dr. Bonney AidStaebler. Ext given.

## 2018-04-14 NOTE — Telephone Encounter (Signed)
-----   Message from Vena AustriaAndreas Staebler, MD sent at 04/14/2018  2:16 PM EDT ----- Regarding: surgery Surgery Date:   LOS: same day surgery  Surgery Booking Request Patient Full Name: Kathleen Burke MRN: [id number]  DOB: 11/30/1975  Surgeon: Vena AustriaAndreas Staebler, MD  Requested Surgery Date and Time: 2-4 weeks Primary Diagnosis and Code: Endometrial polyp Secondary Diagnosis and Code: Abnormal uterine bleeding Surgical Procedure: Hysteroscopy, D&C, Mirena IUD placement L&D Notification:N/A Admission Status: same day surgery Length of Surgery: 1h Special Case Needs: none H&P:  (date) Phone Interview or Office Pre-Admit: phone interview Interpreter: No Language: English Medical Clearance: No Special Scheduling Instructions: Mirena IUD

## 2018-04-20 NOTE — Telephone Encounter (Signed)
Noted. Will order to arrive by H&P date.

## 2018-05-11 ENCOUNTER — Encounter: Payer: Self-pay | Admitting: Obstetrics and Gynecology

## 2018-05-11 ENCOUNTER — Ambulatory Visit (INDEPENDENT_AMBULATORY_CARE_PROVIDER_SITE_OTHER): Payer: BLUE CROSS/BLUE SHIELD | Admitting: Obstetrics and Gynecology

## 2018-05-11 VITALS — BP 134/86 | HR 108 | Ht 66.0 in | Wt 238.0 lb

## 2018-05-11 DIAGNOSIS — N840 Polyp of corpus uteri: Secondary | ICD-10-CM

## 2018-05-11 DIAGNOSIS — Z789 Other specified health status: Secondary | ICD-10-CM | POA: Diagnosis not present

## 2018-05-11 DIAGNOSIS — Z01818 Encounter for other preprocedural examination: Secondary | ICD-10-CM

## 2018-05-11 DIAGNOSIS — N939 Abnormal uterine and vaginal bleeding, unspecified: Secondary | ICD-10-CM

## 2018-05-11 NOTE — Progress Notes (Signed)
Obstetrics & Gynecology Surgery H&P    Chief Complaint: Scheduled Surgery   History of Present Illness: Patient is a 42 y.o. [id number] presenting for scheduled hysteroscopy, D&C, Mirena IUD, for the treatment or further evaluation of abnormal uterine bleeding secondary to endometrial polyp.   Prior Treatments prior to proceeding with surgery include: ultrasound diagnosis  Preoperative Pap:03/23/18 Results: no abnormalities  Preoperative Endometrial biopsy: N/A focal lesion Preoperative Ultrasound: 04/07/18 Findings: Endometrial polyp 1.29 x 0.58cm   Review of Systems:10 point review of systems  Past Medical History:  Past Medical History:  Diagnosis Date  . Anxiety 2014  . Chicken pox   . Depression   . Frequent headaches   . Hypertension   . Obesity (BMI 30-39.9)     Past Surgical History:  Past Surgical History:  Procedure Laterality Date  . WISDOM TOOTH EXTRACTION      Family History:  Family History  Problem Relation Age of Onset  . Arthritis Mother   . Hyperlipidemia Mother   . Hypertension Mother   . Diabetes Mellitus I Father        DM I  . Breast cancer Maternal Grandmother 50  . Arthritis Paternal Grandmother   . Diabetes Mellitus II Paternal Grandmother   . Arthritis Paternal Grandfather   . Hyperlipidemia Paternal Grandfather   . Stroke Paternal Grandfather   . Hypertension Paternal Grandfather   . Diabetes Mellitus II Sister   . Diabetes Mellitus II Sister   . Hyperlipidemia Sister   . Hypertension Sister     Social History:  Social History   Socioeconomic History  . Marital status: Married    Spouse name: Not on file  . Number of children: 2  . Years of education: Not on file  . Highest education level: Not on file  Occupational History  . Occupation: Charity fundraiser  Social Needs  . Financial resource strain: Not on file  . Food insecurity:    Worry: Not on file    Inability: Not on file  . Transportation needs:    Medical: Not on file   Non-medical: Not on file  Tobacco Use  . Smoking status: Never Smoker  . Smokeless tobacco: Never Used  Substance and Sexual Activity  . Alcohol use: Yes    Alcohol/week: 1.2 oz    Types: 2 Glasses of wine per week  . Drug use: No  . Sexual activity: Yes    Partners: Male    Birth control/protection: Surgical    Comment: vasectomy  Lifestyle  . Physical activity:    Days per week: 0 days    Minutes per session: 0 min  . Stress: Not at all  Relationships  . Social connections:    Talks on phone: Not on file    Gets together: Not on file    Attends religious service: Not on file    Active member of club or organization: Not on file    Attends meetings of clubs or organizations: Not on file    Relationship status: Not on file  . Intimate partner violence:    Fear of current or ex partner: Not on file    Emotionally abused: Not on file    Physically abused: Not on file    Forced sexual activity: Not on file  Other Topics Concern  . Not on file  Social History Narrative   Married    2 kids    Works occup. [employer]     Allergies:  No Known Allergies  Medications: Prior to Admission medications   Medication Sig Start Date End Date Taking? Authorizing Provider  diazepam (VALIUM) 5 MG tablet 1 tablet once; 1 hour prior to flying. 03/16/18  Yes [name], Pasty Spillersracy N, MD  ferrous sulfate 325 (65 FE) MG tablet Take 325 mg by mouth daily with breakfast.   Yes [provider]  hydrochlorothiazide (HYDRODIURIL) 25 MG tablet Take 1/2 tablet for 3 days then increase to 1 tablet daily in the am Patient taking differently: Take 25 mg by mouth daily.  04/09/18  Yes [name], Pasty Spillersracy N, MD  Multiple Vitamins-Minerals (MULTIVITAMIN PO) Take 1 packet by mouth See admin instructions. [name] Supplement Multivitamin Packet - Take 6 tablets by mouth in the morning and take 7 tablets by mouth at bedtime   Yes [provider]  venlafaxine XR ([name]) 37.5 MG 24 hr  capsule Take 1 capsule (37.5 mg total) by mouth daily. Patient taking differently: Take 37.5 mg by mouth at bedtime.  03/16/18  Yes [name], Pasty Spillersracy N, MD    Physical Exam Vitals: Blood pressure 134/86, pulse (!) 108, height 5\' 6"  (1.676 m), weight 238 lb (108 kg), last menstrual period 04/15/2018. General: NAD HEENT: normocephalic, anicteric Pulmonary: No increased work of breathing Cardiovascular: RRR, distal pulses 2+ Abdomen: soft, non-tender Genitourinary: deferred Extremities: no edema, erythema, or tenderness Neurologic: Grossly intact Psychiatric: mood appropriate, affect full  Imaging No results found.  Assessment: 42 y.o. [id number] presenting for scheduled hysteroscopy, D&C, Mirena IUD  Plan: 1) I have discussed with the patient the indications for the procedure. Included in the discussion were the options of therapy, as wall as their individual risks, benefits, and complications. Ample time was given to answer all questions.   In office pipelle biopsy generally provides [facility], however this sampling modality may miss focal lesions if these were previously documented on ultrasound.  It is because of the potential to miss focal lesions that hysteroscopy D&C is also warranted in patient with continued postmenopausal bleeding that is not self limited regardless of prior in office biopsy results or ultrasound findings.  She understands that the risk of continued observation include worsening bleeding or worsening of any underlying pathology.  The choices include: 1. Doing nothing but following her symptoms 2. Attempts at hormonal manipulation with either BCP or Depo-Provera for premenopausal patients with no concern for focal lesion or endometrial pathology 3. D&C/hysteroscopy. 4. Endometrial ablation via Novasure or other techniques for premenopausal patients with no concern for focal lesion or endometrial pathology  5. As final resort, hysterectomy. After  consideration of her history and findings, mutual decision has been made to proceed with D+C/hysteroscopy. While the incidence is low, the risks from this surgery include, but are not limited to, the risks of anesthesia, hemorrhage, infection, perforation, and injury to adjacent structures including bowel, bladder and blood vessels.   Patient plans IUD, and will schedule soon.  Risks and benefits of IUD discussed including the risks of irregular bleeding, cramping, infection, malpositioning, expulsion or uterine perforation of the IUD (1:1000 placements)  which may require further procedures such as laparoscopy.  IUDs while effective at preventing pregnancy do not prevent transmission of sexually transmitted diseases and use of barrier methods for this purpose was discussed.  Low overall incidence of failure with 99.7% efficacy rate in typical use.  The patient has not contraindication to IUD placement.   2) Routine postoperative instructions were reviewed with the patient and her family in detail today including the expected length of recovery  and likely postoperative course.  The patient concurred with the proposed plan, giving informed written consent for the surgery today.  Patient instructed on the importance of being NPO after midnight prior to her procedure.  If warranted preoperative prophylactic antibiotics and SCDs ordered on call to the OR to meet SCIP guidelines and adhere to recommendation laid forth in ACOG Practice Bulletin Number 104 May 2009  "Antibiotic Prophylaxis for Gynecologic Procedures".     Vena Austria, MD, [facility], [facility] 05/11/2018, 9:33 AM

## 2018-05-13 ENCOUNTER — Other Ambulatory Visit: Payer: Self-pay

## 2018-05-13 ENCOUNTER — Encounter
Admission: RE | Admit: 2018-05-13 | Discharge: 2018-05-13 | Disposition: A | Discharge status: Home / Self Care | Payer: BLUE CROSS/BLUE SHIELD | Source: Ambulatory Visit | Attending: Obstetrics and Gynecology | Admitting: Obstetrics and Gynecology

## 2018-05-13 HISTORY — DX: Gastro-esophageal reflux disease without esophagitis: K21.9

## 2018-05-13 HISTORY — DX: Anemia, unspecified: D64.9

## 2018-05-13 HISTORY — DX: Prediabetes: R73.03

## 2018-05-13 HISTORY — DX: Trigger finger, unspecified finger: M65.30

## 2018-05-13 HISTORY — DX: Unspecified osteoarthritis, unspecified site: M19.90

## 2018-05-13 NOTE — Patient Instructions (Signed)
Your procedure is scheduled on: 05-20-18 THURSDAY Report to Same Day Surgery 2nd floor medical mall [facility](Medical Mall Entrance-take elevator on left to 2nd floor.  Check in with surgery information desk.) To find out your arrival time please call [phone] between 1PM - 3PM [facility]  Remember: Instructions that are not followed completely may result in serious medical risk, up to and including death, or upon the discretion of your surgeon and anesthesiologist your surgery may need to be rescheduled.    _x___ 1. Do not eat food after midnight the night before your procedure. NO GUM OR CANDY AFTER MIDNIGHT. You may drink clear liquids up to 2 hours before you are scheduled to arrive at the hospital for your procedure.  Do not drink clear liquids within 2 hours of your scheduled arrival to the hospital.  Clear liquids include  --Water or Apple juice without pulp  --Clear carbohydrate beverage such as ClearFast or Gatorade  --Black Coffee or Clear Tea (No milk, no creamers, do not add anything to the coffee or Tea    __x__ 2. No Alcohol for 24 hours before or after surgery.   __x__3. No Smoking or e-cigarettes for 24 prior to surgery.  Do not use any chewable tobacco products for at least 6 hour prior to surgery   ____  4. Bring all medications with you on the day of surgery if instructed.    __x__ 5. Notify your doctor if there is any change in your medical condition     (cold, fever, infections).    x___6. On the morning of surgery brush your teeth with toothpaste and water.  You may rinse your mouth with mouth wash if you wish.  Do not swallow any toothpaste or mouthwash.   Do not wear jewelry, make-up, hairpins, clips or nail polish.  Do not wear lotions, powders, or perfumes. You may wear deodorant.  Do not shave 48 hours prior to surgery. Men may shave face and neck.  Do not bring valuables to the hospital.    [facility] is not responsible for any belongings or  valuables.               Contacts, dentures or bridgework may not be worn into surgery.  Leave your suitcase in the car. After surgery it may be brought to your room.  For patients admitted to the hospital, discharge time is determined by your treatment team.  _  Patients discharged the day of surgery will not be allowed to drive home.  You will need someone to drive you home and stay with you the night of your procedure.    Please read over the following fact sheets that you were given:   [facility] Preparing for Surgery and or MRSA Information   _x___ TAKE THE FOLLOWING MEDICATION THE MORNING OF SURGERY WITH A SMALL SIP OF WATER. These include:  1. ZANTAC  2. TAKE A ZANTAC THE NIGHT BEFORE YOUR SURGERY  3.  4.  5.  6.  ____Fleets enema or Magnesium Citrate as directed.   _x___ Use CHG Soap or sage wipes as directed on instruction sheet   ____ Use inhalers on the day of surgery and bring to hospital day of surgery  ____ Stop Metformin and Janumet 2 days prior to surgery.    ____ Take 1/2 of usual insulin dose the night before surgery and none on the morning surgery.   ____ Follow recommendations from Cardiologist, Pulmonologist or PCP regarding stopping Aspirin, Coumadin,  Plavix ,Eliquis, Effient, or Pradaxa, and Pletal.  X____Stop Anti-inflammatories such as Advil, Aleve, Ibuprofen, Motrin, Naproxen, Naprosyn, Goodies powders or aspirin products NOW-OK to take Tylenol   ____ Stop supplements until after surgery.     ____ Bring C-Pap to the hospital.

## 2018-05-18 ENCOUNTER — Encounter
Admission: RE | Admit: 2018-05-18 | Discharge: 2018-05-18 | Disposition: A | Discharge status: Home / Self Care | Payer: BLUE CROSS/BLUE SHIELD | Source: Ambulatory Visit | Attending: Obstetrics and Gynecology | Admitting: Obstetrics and Gynecology

## 2018-05-18 DIAGNOSIS — Z01810 Encounter for preprocedural cardiovascular examination: Secondary | ICD-10-CM | POA: Diagnosis not present

## 2018-05-18 DIAGNOSIS — Z01812 Encounter for preprocedural laboratory examination: Secondary | ICD-10-CM | POA: Diagnosis present

## 2018-05-18 DIAGNOSIS — I10 Essential (primary) hypertension: Secondary | ICD-10-CM | POA: Insufficient documentation

## 2018-05-18 LAB — CBC
HCT: 33.7 % — ABNORMAL LOW (ref 35.0–47.0)
Hemoglobin: 11.3 g/dL — ABNORMAL LOW (ref 12.0–16.0)
MCH: 27.6 pg (ref 26.0–34.0)
MCHC: 33.7 g/dL (ref 32.0–36.0)
MCV: 81.8 fL (ref 80.0–100.0)
Platelets: 348 10*3/uL (ref 150–440)
RBC: 4.12 MIL/uL (ref 3.80–5.20)
RDW: 19.8 % — ABNORMAL HIGH (ref 11.5–14.5)
WBC: 5.3 10*3/uL (ref 3.6–11.0)

## 2018-05-18 LAB — POTASSIUM: Potassium: 3.4 mmol/L — ABNORMAL LOW (ref 3.5–5.1)

## 2018-05-18 NOTE — Telephone Encounter (Signed)
Mirena given to AMS for 05/20/18 OR insertion.

## 2018-05-20 ENCOUNTER — Ambulatory Visit: Payer: BLUE CROSS/BLUE SHIELD

## 2018-05-20 ENCOUNTER — Ambulatory Visit
Admission: RE | Admit: 2018-05-20 | Discharge: 2018-05-20 | Disposition: A | Discharge status: Home / Self Care | Payer: BLUE CROSS/BLUE SHIELD | Source: Ambulatory Visit | Attending: Obstetrics and Gynecology | Admitting: Obstetrics and Gynecology

## 2018-05-20 ENCOUNTER — Encounter
Admission: RE | Disposition: A | Discharge status: Home / Self Care | Payer: Self-pay | Source: Ambulatory Visit | Attending: Obstetrics and Gynecology

## 2018-05-20 DIAGNOSIS — F329 Major depressive disorder, single episode, unspecified: Secondary | ICD-10-CM | POA: Diagnosis not present

## 2018-05-20 DIAGNOSIS — D649 Anemia, unspecified: Secondary | ICD-10-CM | POA: Diagnosis not present

## 2018-05-20 DIAGNOSIS — N939 Abnormal uterine and vaginal bleeding, unspecified: Secondary | ICD-10-CM | POA: Insufficient documentation

## 2018-05-20 DIAGNOSIS — F419 Anxiety disorder, unspecified: Secondary | ICD-10-CM | POA: Insufficient documentation

## 2018-05-20 DIAGNOSIS — I10 Essential (primary) hypertension: Secondary | ICD-10-CM | POA: Diagnosis not present

## 2018-05-20 DIAGNOSIS — N938 Other specified abnormal uterine and vaginal bleeding: Secondary | ICD-10-CM | POA: Diagnosis not present

## 2018-05-20 DIAGNOSIS — N840 Polyp of corpus uteri: Secondary | ICD-10-CM | POA: Insufficient documentation

## 2018-05-20 DIAGNOSIS — Z79899 Other long term (current) drug therapy: Secondary | ICD-10-CM | POA: Diagnosis not present

## 2018-05-20 DIAGNOSIS — Z9889 Other specified postprocedural states: Secondary | ICD-10-CM

## 2018-05-20 DIAGNOSIS — Z30430 Encounter for insertion of intrauterine contraceptive device: Secondary | ICD-10-CM | POA: Insufficient documentation

## 2018-05-20 HISTORY — PX: HYSTEROSCOPY WITH D & C: SHX1775

## 2018-05-20 HISTORY — PX: INTRAUTERINE DEVICE (IUD) INSERTION: SHX5877

## 2018-05-20 LAB — POCT PREGNANCY, URINE: Preg Test, Ur: NEGATIVE

## 2018-05-20 SURGERY — DILATATION AND CURETTAGE /HYSTEROSCOPY
Anesthesia: General | Wound class: Clean Contaminated

## 2018-05-20 MED ORDER — PROPOFOL 10 MG/ML IV BOLUS
INTRAVENOUS | Status: AC
  Filled 2018-05-20: qty 20

## 2018-05-20 MED ORDER — FENTANYL CITRATE (PF) 100 MCG/2ML IJ SOLN: 25.0000 ug | INTRAMUSCULAR | Status: DC | PRN

## 2018-05-20 MED ORDER — ONDANSETRON HCL 4 MG/2ML IJ SOLN
INTRAMUSCULAR | Status: DC | PRN
  Administered 2018-05-20: 4 mg via INTRAVENOUS

## 2018-05-20 MED ORDER — LACTATED RINGERS IV SOLN
INTRAVENOUS | Status: DC
  Administered 2018-05-20 (×2): via INTRAVENOUS

## 2018-05-20 MED ORDER — FENTANYL CITRATE (PF) 100 MCG/2ML IJ SOLN
INTRAMUSCULAR | Status: AC
  Filled 2018-05-20: qty 2

## 2018-05-20 MED ORDER — SUCCINYLCHOLINE CHLORIDE 20 MG/ML IJ SOLN
INTRAMUSCULAR | Status: DC | PRN
  Administered 2018-05-20: 100 mg via INTRAVENOUS

## 2018-05-20 MED ORDER — SUGAMMADEX SODIUM 500 MG/5ML IV SOLN
INTRAVENOUS | Status: AC
  Filled 2018-05-20: qty 5

## 2018-05-20 MED ORDER — MIDAZOLAM HCL 2 MG/2ML IJ SOLN
INTRAMUSCULAR | Status: DC | PRN
  Administered 2018-05-20: 2 mg via INTRAVENOUS

## 2018-05-20 MED ORDER — SODIUM CHLORIDE 0.9 % IR SOLN
Status: AC | PRN
  Administered 2018-05-20: 1500 mL/h

## 2018-05-20 MED ORDER — SUGAMMADEX SODIUM 500 MG/5ML IV SOLN
INTRAVENOUS | Status: DC | PRN
  Administered 2018-05-20: 200 mg via INTRAVENOUS

## 2018-05-20 MED ORDER — ONDANSETRON HCL 4 MG/2ML IJ SOLN: 4.0000 mg | Freq: Once | INTRAMUSCULAR | Status: DC | PRN

## 2018-05-20 MED ORDER — DEXAMETHASONE SODIUM PHOSPHATE 10 MG/ML IJ SOLN
INTRAMUSCULAR | Status: DC | PRN
  Administered 2018-05-20: 5 mg via INTRAVENOUS

## 2018-05-20 MED ORDER — IBUPROFEN 600 MG PO TABS: 600.0000 mg | ORAL_TABLET | Freq: Four times a day (QID) | ORAL | 3 refills | Status: DC | PRN

## 2018-05-20 MED ORDER — LIDOCAINE HCL (CARDIAC) PF 100 MG/5ML IV SOSY
PREFILLED_SYRINGE | INTRAVENOUS | Status: DC | PRN
  Administered 2018-05-20: 100 mg via INTRAVENOUS

## 2018-05-20 MED ORDER — SILVER NITRATE-POT NITRATE 75-25 % EX MISC
CUTANEOUS | Status: DC | PRN
  Administered 2018-05-20: 3

## 2018-05-20 MED ORDER — ROCURONIUM BROMIDE 100 MG/10ML IV SOLN
INTRAVENOUS | Status: DC | PRN
  Administered 2018-05-20: 5 mg via INTRAVENOUS
  Administered 2018-05-20: 10 mg via INTRAVENOUS

## 2018-05-20 MED ORDER — MIDAZOLAM HCL 2 MG/2ML IJ SOLN
INTRAMUSCULAR | Status: AC
  Filled 2018-05-20: qty 2

## 2018-05-20 MED ORDER — FENTANYL CITRATE (PF) 100 MCG/2ML IJ SOLN
INTRAMUSCULAR | Status: DC | PRN
  Administered 2018-05-20: 100 ug via INTRAVENOUS

## 2018-05-20 MED ORDER — HYDROCODONE-ACETAMINOPHEN 5-325 MG PO TABS: 1.0000 | ORAL_TABLET | Freq: Four times a day (QID) | ORAL | 0 refills | Status: DC | PRN

## 2018-05-20 MED ORDER — LABETALOL HCL 5 MG/ML IV SOLN
INTRAVENOUS | Status: DC | PRN
  Administered 2018-05-20: 10 mg via INTRAVENOUS
  Administered 2018-05-20: 20 mg via INTRAVENOUS
  Administered 2018-05-20: 30 mg via INTRAVENOUS

## 2018-05-20 MED ORDER — PROPOFOL 10 MG/ML IV BOLUS
INTRAVENOUS | Status: DC | PRN
  Administered 2018-05-20: 200 mg via INTRAVENOUS

## 2018-05-20 MED ORDER — PHENYLEPHRINE HCL 10 MG/ML IJ SOLN
INTRAMUSCULAR | Status: DC | PRN
  Administered 2018-05-20: 100 ug via INTRAVENOUS
  Administered 2018-05-20: 150 ug via INTRAVENOUS
  Administered 2018-05-20: 100 ug via INTRAVENOUS

## 2018-05-20 SURGICAL SUPPLY — 20 items
CANISTER SUC SOCK COL 7IN (MISCELLANEOUS) ×2 IMPLANT
CATH ROBINSON RED A/P 16FR (CATHETERS) ×2 IMPLANT
ELECT REM PT RETURN 9FT ADLT (ELECTROSURGICAL) ×2
ELECTRODE REM PT RTRN 9FT ADLT (ELECTROSURGICAL) ×1 IMPLANT
GLOVE BIO SURGEON STRL SZ7 (GLOVE) ×2 IMPLANT
GLOVE INDICATOR 7.5 STRL GRN (GLOVE) ×2 IMPLANT
GOWN STRL REUS W/ TWL LRG LVL3 (GOWN DISPOSABLE) ×2 IMPLANT
GOWN STRL REUS W/TWL LRG LVL3 (GOWN DISPOSABLE) ×4
IV LACTATED RINGERS 1000ML (IV SOLUTION) ×2 IMPLANT
KIT TURNOVER CYSTO (KITS) ×2 IMPLANT
MYOSURE LITE POLYP REMOVAL (MISCELLANEOUS) ×1 IMPLANT
PACK DNC HYST (MISCELLANEOUS) ×2 IMPLANT
PAD OB MATERNITY 4.3X12.25 (PERSONAL CARE ITEMS) ×2 IMPLANT
PAD PREP 24X41 OB/GYN DISP (PERSONAL CARE ITEMS) ×2 IMPLANT
SEAL ROD LENS SCOPE MYOSURE (ABLATOR) ×2 IMPLANT
SYS MIRENA INTRAUTERINE (MISCELLANEOUS) ×2
SYSTEM MIRENA INTRAUTERINE (MISCELLANEOUS) IMPLANT
TOWEL OR 17X26 4PK STRL BLUE (TOWEL DISPOSABLE) ×2 IMPLANT
TUBING CONNECTING 10 (TUBING) ×2 IMPLANT
TUBING HYSTEROSCOPY DOLPHIN (MISCELLANEOUS) ×1 IMPLANT

## 2018-05-20 NOTE — H&P (Signed)
Date of Initial H&P: 05/11/2018  History reviewed, patient examined, no change in status, stable for surgery.

## 2018-05-20 NOTE — Op Note (Signed)
Preoperative Diagnosis: 1) 42 y.o. with abnormal uterine bleeding 2) Endometrial polyp  Postoperative Diagnosis: 1) 42 y.o. with abnormal uterine bleeding 2) Endometrial polyp  Operation Performed: Hysteroscopy, dilation and curettage, Mirena IUD insertion  Indication: AUB with focal lesion  Anesthesia: General  Primary Surgeon: Vena AustriaAndreas Taite Baldassari, MD  Assistant: none  Preoperative Antibiotics: none  Estimated Blood Loss: 0 mL  IV Fluids: 900mL  Drains or Tubes: none  Implants: none  Specimens Removed: endometrial curettings, endometrial polyp  Complications: none  Intraoperative Findings:  Small endometrial polyp arising from right later aspect of uterus, otherwise normal endometrial cavity  Patient Condition: stable  Procedure in Detail:  Patient was taken to the operating room were she was administered general endotracheal anesthesia.  She was positioned in the dorsal lithotomy position utilizing Allen stirups, prepped and draped in the usual sterile fashion.  Uterus was noted to be non-enlarged in size, anteverted.   Prior to proceeding with the case a time out was performed.  Attention was turned to the patient's pelvis.  A red rubber catheter was used to empty the patient's bladder.  An operative speculum was placed to allow visualization of the cervix.  The anterior lip of the cervix was grasped with a single tooth tenaculum and the cervix was sequentially dilated using pratt dilators.  The hysteroscope was then advanced into the uterine cavity noting the above findings.  Targeted curettage was performed using the myosure to remove the polyp as well as collect a representative sampling of the endometrium resulting specimen collected and sent to pathology.     Uterus sounded to 8 cm. IUD placed per manufacturer's recommendations.  String trimmed to 3cm.  The single tooth tenaculum was removed from the cervix.  The tenaculum sites and cervix were noted to be  Hemostatic  before removing the operative speculum.  Sponge needle and instrument counts were corrects times two.  The patient tolerated the procedure well and was taken to the recovery room in stable condition.

## 2018-05-20 NOTE — Discharge Instructions (Addendum)
AMBULATORY SURGERY  DISCHARGE INSTRUCTIONS   1) The drugs that you were given will stay in your system until tomorrow so for the next 24 hours you should not:  A) Drive an automobile B) Make any legal decisions C) Drink any alcoholic beverage   2) You may resume regular meals tomorrow.  Today it is better to start with liquids and gradually work up to solid foods.  You may eat anything you prefer, but it is better to start with liquids, then soup and crackers, and gradually work up to solid foods.   3) Please notify your doctor immediately if you have any unusual bleeding, trouble breathing, redness and pain at the surgery site, drainage, fever, or pain not relieved by medication.    4) Additional Instructions:        Please contact your physician with any problems or Same Day Surgery at [phone], Monday through Friday 6 am to 4 pm, or [place] [facility] Main number at [phone].    Hysteroscopy, Care After Refer to this sheet in the next few weeks. These instructions provide you with information on caring for yourself after your procedure. Your health care provider may also give you more specific instructions. Your treatment has been planned according to current medical practices, but problems sometimes occur. Call your health care provider if you have any problems or questions after your procedure. What can I expect after the procedure? After your procedure, it is typical to have the following:  You may have some cramping. This normally lasts for a couple days.  You may have bleeding. This can vary from light spotting for a few days to menstrual-like bleeding for 3-7 days.  Follow these instructions at home:  Rest for the first 1-2 days after the procedure.  Only take over-the-counter or prescription medicines as directed by your health care provider. Do not take aspirin. It can increase the chances of bleeding.  Take showers instead of baths for 2 weeks  or as directed by your health care provider.  Do not drive for 24 hours or as directed.  Do not drink alcohol while taking pain medicine.  Do not use tampons, douche, or have sexual intercourse for 2 weeks or until your health care provider says it is okay.  Take your temperature twice a day for 4-5 days. Write it down each time.  Follow your health care provider's advice about diet, exercise, and lifting.  If you develop constipation, you may: ? Take a mild laxative if your health care provider approves. ? Add bran foods to your diet. ? Drink enough fluids to keep your urine clear or pale yellow.  Try to have someone with you or available to you for the first 24-48 hours, especially if you were given a general anesthetic.  Follow up with your health care provider as directed. Contact a health care provider if:  You feel dizzy or lightheaded.  You feel sick to your stomach (nauseous).  You have abnormal vaginal discharge.  You have a rash.  You have pain that is not controlled with medicine. Get help right away if:  You have bleeding that is heavier than a normal menstrual period.  You have a fever.  You have increasing cramps or pain, not controlled with medicine.  You have new belly (abdominal) pain.  You pass out.  You have pain in the tops of your shoulders (shoulder strap areas).  You have shortness of breath. This information is not intended to replace advice  given to you by your health care provider. Make sure you discuss any questions you have with your health care provider. Document Released: 08/03/2013 Document Revised: 03/20/2016 Document Reviewed: 05/12/2013 Elsevier Interactive Patient Education  2017 [employer].

## 2018-05-20 NOTE — Anesthesia Procedure Notes (Signed)
Procedure Name: Intubation Date/Time: 05/20/2018 9:47 AM Performed by: Allean Found, CRNA Pre-anesthesia Checklist: Patient identified, Patient being monitored, Timeout performed, Emergency Drugs available and Suction available Patient Re-evaluated:Patient Re-evaluated prior to induction Oxygen Delivery Method: Circle system utilized Preoxygenation: Pre-oxygenation with 100% oxygen Induction Type: IV induction Ventilation: Mask ventilation without difficulty Laryngoscope Size: Mac and 3 Grade View: Grade I Tube type: Oral Tube size: 7.0 mm Number of attempts: 1 Airway Equipment and Method: Stylet Placement Confirmation: ETT inserted through vocal cords under direct vision,  positive ETCO2 and breath sounds checked- equal and bilateral Secured at: 22 cm Tube secured with: Tape Dental Injury: Teeth and Oropharynx as per pre-operative assessment  Difficulty Due To: Difficulty was unanticipated and Difficult Airway- due to anterior larynx

## 2018-05-20 NOTE — Transfer of Care (Signed)
Immediate Anesthesia Transfer of Care Note  Patient: Kathleen Burke  Procedure(s) Performed: DILATATION AND CURETTAGE /HYSTEROSCOPY (N/A ) INTRAUTERINE DEVICE (IUD) INSERTION (N/A )  Patient Location: PACU  Anesthesia Type:General  Level of Consciousness: drowsy and patient cooperative  Airway & Oxygen Therapy: Patient Spontanous Breathing and Patient connected to face mask oxygen  Post-op Assessment: Report given to RN and Post -op Vital signs reviewed and stable  Post vital signs: Reviewed and stable  Last Vitals:  Vitals Value Taken Time  BP    Temp    Pulse 97 05/20/2018 10:36 AM  Resp 16 05/20/2018 10:36 AM  SpO2 100 % 05/20/2018 10:36 AM  Vitals shown include unvalidated device data.  Last Pain:  Vitals:   05/20/18 0844  TempSrc: Oral  PainSc: 0-No pain         Complications: No apparent anesthesia complications

## 2018-05-20 NOTE — Anesthesia Preprocedure Evaluation (Signed)
Anesthesia Evaluation  Patient identified by MRN, date of birth, ID band Patient awake    Reviewed: Allergy & Precautions, NPO status , Patient's Chart, lab work & pertinent test results  Airway Mallampati: III       Dental   Pulmonary neg pulmonary ROS,    Pulmonary exam normal        Cardiovascular hypertension, Pt. on medications Normal cardiovascular exam     Neuro/Psych  Headaches, PSYCHIATRIC DISORDERS Anxiety Depression    GI/Hepatic Neg liver ROS, GERD  ,  Endo/Other    Renal/GU negative Renal ROS     Musculoskeletal  (+) Arthritis , Osteoarthritis,    Abdominal Normal abdominal exam  (+)   Peds  Hematology  (+) anemia ,   Anesthesia Other Findings   Reproductive/Obstetrics                             Anesthesia Physical Anesthesia Plan  ASA: II  Anesthesia Plan: General   Post-op Pain Management:    Induction: Intravenous  PONV Risk Score and Plan:   Airway Management Planned: Oral ETT  Additional Equipment:   Intra-op Plan:   Post-operative Plan: Extubation in OR  Informed Consent: I have reviewed the patients History and Physical, chart, labs and discussed the procedure including the risks, benefits and alternatives for the proposed anesthesia with the patient or authorized representative who has indicated his/her understanding and acceptance.   Dental advisory given  Plan Discussed with: CRNA  Anesthesia Plan Comments:         Anesthesia Quick Evaluation

## 2018-05-20 NOTE — Anesthesia Post-op Follow-up Note (Signed)
Anesthesia QCDR form completed.        

## 2018-05-20 NOTE — Anesthesia Postprocedure Evaluation (Signed)
Anesthesia Post Note  Patient: Kathleen Burke  Procedure(s) Performed: DILATATION AND CURETTAGE /HYSTEROSCOPY (N/A ) INTRAUTERINE DEVICE (IUD) INSERTION (N/A )  Patient location during evaluation: PACU Anesthesia Type: General Level of consciousness: awake and alert and oriented Pain management: pain level controlled Vital Signs Assessment: post-procedure vital signs reviewed and stable Respiratory status: spontaneous breathing Cardiovascular status: blood pressure returned to baseline Anesthetic complications: no     Last Vitals:  Vitals:   05/20/18 1127 05/20/18 1145  BP: (!) 146/90 136/86  Pulse: (!) 102 97  Resp: 16 18  Temp: 36.5 C   SpO2: 93% 98%    Last Pain:  Vitals:   05/20/18 1145  TempSrc:   PainSc: 0-No pain                 [name]

## 2018-05-21 ENCOUNTER — Encounter: Payer: Self-pay | Admitting: Obstetrics and Gynecology

## 2018-05-21 LAB — SURGICAL PATHOLOGY

## 2018-05-26 HISTORY — PX: TRIGGER FINGER RELEASE: SHX641

## 2018-05-28 ENCOUNTER — Ambulatory Visit (INDEPENDENT_AMBULATORY_CARE_PROVIDER_SITE_OTHER): Payer: BLUE CROSS/BLUE SHIELD | Admitting: Obstetrics and Gynecology

## 2018-05-28 ENCOUNTER — Ambulatory Visit: Payer: BLUE CROSS/BLUE SHIELD | Admitting: Obstetrics and Gynecology

## 2018-05-28 ENCOUNTER — Encounter: Payer: Self-pay | Admitting: Obstetrics and Gynecology

## 2018-05-28 VITALS — BP 158/90 | HR 112 | Ht 66.0 in | Wt 236.0 lb

## 2018-05-28 DIAGNOSIS — Z4889 Encounter for other specified surgical aftercare: Secondary | ICD-10-CM

## 2018-05-28 NOTE — Progress Notes (Signed)
      Postoperative Follow-up Patient presents post op from hysteroscopy, D&C, and Mirena placement 1weeks ago for abnormal uterine bleeding and endometrial polyp.  Subjective: Patient reports marked improvement in her preop symptoms. Eating a regular diet without difficulty. The patient is not having any pain.  Activity: normal activities of daily living.  Objective: Blood pressure (!) 158/90, pulse (!) 112, height 5\' 6"  (1.676 m), weight 236 lb (107 kg), last menstrual period 05/15/2018.  Gen: NAD HEENT: normocephalic, anicteric Pulmonary: no increased work of breathing  Admission on 05/20/2018, Discharged on 05/20/2018  Component Date Value Ref Range Status  . Preg Test, Ur 05/20/2018 NEGATIVE  NEGATIVE Final   Comment:        THE SENSITIVITY OF THIS METHODOLOGY IS >24 mIU/mL   . SURGICAL PATHOLOGY 05/20/2018    Final                   Value:Surgical Pathology CASE: [id number] PATIENT: Kathleen Burke Surgical Pathology Report     SPECIMEN SUBMITTED: A. Endometrial polyps and currettings  CLINICAL HISTORY: None provided  PRE-OPERATIVE DIAGNOSIS: Endometrial polyp, abnormal uterine bleeding  POST-OPERATIVE DIAGNOSIS: None provided.     DIAGNOSIS: A. ENDOMETRIUM; POLYPECTOMY AND CURETTAGE: - ENDOMETRIAL POLYP, IN FRAGMENTS, WITH SECRETORY CHANGE. - A FEW SMALL FRAGMENTS OF BASALIS-TYPE ENDOMETRIUM AND SCANT ENDOMETRIAL FRAGMENTS WITH BREAKDOWN. - NEGATIVE FOR ATYPIA AND MALIGNANCY.   GROSS DESCRIPTION: A. Labeled: Endometrial polyps and curetting Received: In formalin Tissue fragment(s): Multiple Size: Aggregate, 2.6 x 1.7 x 0.2 cm Description: Blood clot and rubbery pink tissue fragments Entirely submitted in one cassette.     Final Diagnosis performed by Ronald LoboMary Olney, MD.   Electronically signed 05/21/2018 3:38:20PM The electronic signature indicates that the named Attending Patholo                         gist has evaluated the specimen  Technical component performed at [facility], [address], [place], [place] [address] Lab: [phone] Dir: Jolene SchimkeSanjai Nagendra, MD, MMM  Professional component performed at [facility], [facility], [address], [place], [place] [address] Lab: [phone] Dir: Georgiann Cockerara C. Oneita Krasubinas, MD     Assessment: 42 y.o. s/p hysteroscopy, D&C and Mirean placement stable  Plan: Patient has done well after surgery with no apparent complications.  I have discussed the post-operative course to date, and the expected progress moving forward.  The patient understands what complications to be concerned about.  I will see the patient in routine follow up, or sooner if needed.    Activity plan: No restriction.   Vena AustriaAndreas Akia Montalban, MD, Evern CoreFACOG [facility], [facility] 05/28/2018, 8:58 AM

## 2018-06-14 ENCOUNTER — Ambulatory Visit: Payer: BLUE CROSS/BLUE SHIELD | Attending: Orthopedic Surgery | Admitting: Occupational Therapy

## 2018-06-14 ENCOUNTER — Encounter: Payer: Self-pay | Admitting: Occupational Therapy

## 2018-06-14 ENCOUNTER — Other Ambulatory Visit: Payer: Self-pay

## 2018-06-14 DIAGNOSIS — R600 Localized edema: Secondary | ICD-10-CM | POA: Diagnosis present

## 2018-06-14 DIAGNOSIS — M25641 Stiffness of right hand, not elsewhere classified: Secondary | ICD-10-CM | POA: Diagnosis not present

## 2018-06-14 DIAGNOSIS — M79641 Pain in right hand: Secondary | ICD-10-CM | POA: Insufficient documentation

## 2018-06-14 DIAGNOSIS — L905 Scar conditions and fibrosis of skin: Secondary | ICD-10-CM | POA: Insufficient documentation

## 2018-06-14 NOTE — Therapy (Signed)
[facility] PHYSICAL AND SPORTS MEDICINE [address] [place], [place], [address] Phone: [phone]   Fax:  [phone]  Occupational Therapy Evaluation  Patient Details  Name: Kathleen Burke MRN: [id number] Date of Birth: 05/09/1976 Referring Provider: Menz/ Floyce StakesGaines   Encounter Date: 06/14/2018  OT End of Session - 06/14/18 0925    Visit Number  1    Number of Visits  8    Date for OT Re-Evaluation  07/12/18    OT Start Time  0847    OT Stop Time  0923    OT Time Calculation (min)  36 min    Activity Tolerance  Patient tolerated treatment well    Behavior During Therapy  [facility] for tasks assessed/performed       Past Medical History:  Diagnosis Date  . Anemia   . Anxiety 2014  . Arthritis   . Chicken pox   . Depression   . Frequent headaches   . GERD (gastroesophageal reflux disease)   . Hypertension   . Obesity (BMI 30-39.9)   . Pre-diabetes   . Trigger finger    MIDDLE FINGER RIGHT HAND SURGERY ON JULY 31ST 2019    Past Surgical History:  Procedure Laterality Date  . HYSTEROSCOPY W/D&C N/A 05/20/2018   Procedure: DILATATION AND CURETTAGE /HYSTEROSCOPY;  Surgeon: Vena AustriaStaebler, Andreas, MD;  Location: [facility] ORS;  Service: Gynecology;  Laterality: N/A;  . INTRAUTERINE DEVICE (IUD) INSERTION N/A 05/20/2018   Procedure: INTRAUTERINE DEVICE (IUD) INSERTION;  Surgeon: Vena AustriaStaebler, Andreas, MD;  Location: [facility] ORS;  Service: Gynecology;  Laterality: N/A;  . WISDOM TOOTH EXTRACTION      There were no vitals filed for this visit.     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[employer], likes to shop , cook  and do own house work       Edema   Edema  Prox phalanges and PIP of 3rd increase by 0.8 cm       Right Hand AROM   R Index  MCP 0-90  80 Degrees    R Index PIP 0-100  95 Degrees    R Long  MCP 0-90  65 Degrees    R Long PIP 0-100  80 Degrees   extention -21   R Ring  MCP 0-90  85 Degrees    R Ring PIP 0-100  100 Degrees    R Little  MCP 0-90  90 Degrees    R Little PIP 0-100  100 Degrees         fluidotherapy done - AROM for R digits tendon glides   Flexion and extention improve in R hand digits  review HEP with pt   hand out provided  '   Contrast   scar massage and mobs   cica scar pad for night time  Silicon sleeve for night time   Gentle PROM for PIP of 3rd on table   tapping for extention  Tendon glides  10 reps   2 -3 x day             OT Education - 06/14/18 0924    Education Details  findings of eval and HEP     Person(s) Educated  Patient    Methods  Explanation;Demonstration    Comprehension  Verbalized understanding;Returned demonstration       OT Short Term Goals - 06/14/18 0929      OT SHORT TERM GOAL #1   Title  Pain on PRWHE improve with more than 15point     Baseline  at eval PRWHE for pain score 24/50     Time  2    Period  Weeks    Status  New    Target Date  06/28/18      OT SHORT TERM GOAL #2   Title  R 3rd digit extention improve to -5 degrees to get hand in pocket and gloves     Baseline  PIP extention -21 degrees - hard time reaching into pocket     Time  3    Period  Weeks    Status  New    Target Date  07/05/18        OT Long Term Goals - 06/14/18 0930      OT LONG TERM GOAL #1   Title  Pt to be ind in HEP to decrease edema more than .5 cm , decrease scar tissue and increase ROM to improve function more than 50%     Baseline  no knowledge of HEP - edema increase more than 0.8 cm , scar tissue thick  and ROM decrease in ext and flexion     Time  4    Period  Weeks    Status  New    Target Date  07/12/18       OT LONG TERM GOAL #2   Title  AROM in R hand improve in all digits to WNL to touch palm to use hand more than 75% of function on PRWHE     Baseline   AROM decrease flexion , and ext - and not using hand more than 75 % in functional tasks on PRWHE     Time  4    Period  Weeks    Status  New    Target Date  07/12/18            Plan - 06/14/18 0925    Clinical Impression Statement  Pt present at OT eval 2 1/2 wks s/p trigger finger release on R 3rd - pt show increaese edema , scar tissue and flexor  contracture - and decrease AROM in flexion and extention  - pt increase pain with use , gripping , and PROM - all limiting her functional use of R dominant hand - pt can benefit from OT services     Occupational performance deficits (Please refer to evaluation for details):  ADL's;IADL's;Work;Play;Leisure    Rehab Potential  Good    Current Impairments/barriers affecting progress:  had trigger finger for more than year- and 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    OT Frequency  2x / week    OT Duration  4 weeks    OT Treatment/Interventions  Therapeutic exercise;Self-care/ADL training;Fluidtherapy;Contrast Bath;Ultrasound;Paraffin;Splinting;Patient/family education;Scar mobilization;Manual Therapy;Passive range of motion    Plan  assess progress with HEP and change as needed     Clinical Decision Making  Several treatment options, min-mod task modification necessary    OT Home Exercise Plan  see pt instruction    Consulted and Agree with Plan of Care  Patient       Patient will benefit from skilled therapeutic intervention in order to improve the following deficits and impairments:  Increased edema, Impaired flexibility, Pain, Decreased scar mobility, Decreased strength, Decreased range of motion, Impaired UE functional use  Visit Diagnosis: Stiffness of right hand, not elsewhere classified - Plan: Ot plan of care cert/re-cert  Localized edema - Plan: Ot plan of care  cert/re-cert  Pain in right hand - Plan: Ot plan of care cert/re-cert  Scar condition and fibrosis of skin - Plan: Ot plan of care cert/re-cert    Problem List Patient Active Problem List   Diagnosis Date Noted  . No blood products 05/11/2018  . Low vitamin D level 03/23/2018  . Microcytic hypochromic anemia 03/23/2018  . Anxiety and depression 01/14/2016    Oletta CohnuPreez, Clydie Dillen OTR/L,CLT 06/14/2018, 9:35 AM  [facility] [address] [place], [place], [address] Phone: [phone]   Fax:  [phone]  Name: Kathleen Burke MRN: [id number] Date of Birth: 10/11/1976

## 2018-06-14 NOTE — Patient Instructions (Signed)
Contrast   scar massage and mobs   cica scar pad for night time  Silicon sleeve for night time   Gentle PROM for PIP of 3rd on table   tapping for extention  Tendon glides   10 reps   2 -3 x day

## 2018-06-15 ENCOUNTER — Ambulatory Visit
Admission: RE | Admit: 2018-06-15 | Discharge: 2018-06-15 | Disposition: A | Discharge status: Home / Self Care | Payer: BLUE CROSS/BLUE SHIELD | Source: Ambulatory Visit | Attending: Internal Medicine | Admitting: Internal Medicine

## 2018-06-15 DIAGNOSIS — Z1231 Encounter for screening mammogram for malignant neoplasm of breast: Secondary | ICD-10-CM | POA: Insufficient documentation

## 2018-06-17 ENCOUNTER — Ambulatory Visit: Payer: BLUE CROSS/BLUE SHIELD | Admitting: Occupational Therapy

## 2018-06-17 DIAGNOSIS — R600 Localized edema: Secondary | ICD-10-CM

## 2018-06-17 DIAGNOSIS — M25641 Stiffness of right hand, not elsewhere classified: Secondary | ICD-10-CM

## 2018-06-17 DIAGNOSIS — L905 Scar conditions and fibrosis of skin: Secondary | ICD-10-CM

## 2018-06-17 DIAGNOSIS — M79641 Pain in right hand: Secondary | ICD-10-CM

## 2018-06-17 NOTE — Therapy (Signed)
[facility] [address] [place], [place], [address] Phone: [phone]   Fax:  [phone]  Occupational Therapy Treatment  Patient Details  Name: Kathleen KluverDenise J Biegler MRN: [id number] Date of Birth: 02/20/1976 Referring Provider: Menz/ Floyce StakesGaines   Encounter Date: 06/17/2018  OT End of Session - 06/17/18 1401    Visit Number  2    Number of Visits  8    Date for OT Re-Evaluation  07/12/18    OT Start Time  1347    OT Stop Time  1423    OT Time Calculation (min)  36 min    Activity Tolerance  Patient tolerated treatment well    Behavior During Therapy  [facility] for tasks assessed/performed       Past Medical History:  Diagnosis Date  . Anemia   . Anxiety 2014  . Arthritis   . Chicken pox   . Depression   . Frequent headaches   . GERD (gastroesophageal reflux disease)   . Hypertension   . Obesity (BMI 30-39.9)   . Pre-diabetes   . Trigger finger    MIDDLE FINGER RIGHT HAND SURGERY ON JULY 31ST 2019    Past Surgical History:  Procedure Laterality Date  . HYSTEROSCOPY W/D&C N/A 05/20/2018   Procedure: DILATATION AND CURETTAGE /HYSTEROSCOPY;  Surgeon: Vena AustriaStaebler, Andreas, MD;  Location: [facility] ORS;  Service: Gynecology;  Laterality: N/A;  . INTRAUTERINE DEVICE (IUD) INSERTION N/A 05/20/2018   Procedure: INTRAUTERINE DEVICE (IUD) INSERTION;  Surgeon: Vena AustriaStaebler, Andreas, MD;  Location: [facility] ORS;  Service: Gynecology;  Laterality: N/A;  . WISDOM TOOTH EXTRACTION      There were no vitals filed for this visit.  Subjective Assessment - 06/17/18 1358    Subjective   Doing okay - swelling still issues - night time with that sleeve it goes down but then it increase again - no pain really with using it except when I try and bend or straigthen it     Patient Stated Goals  I want to be able to get my finger straight to get my gloves on , hand in pocket , grip ,carry objects     Currently in Pain?  Yes    Pain Score  3     Pain  Location  Finger (Comment which one)    Pain Orientation  Right    Pain Descriptors / Indicators  Aching;Sore    Pain Type  Surgical pain    Pain Onset  1 to 4 weeks ago    Pain Frequency  Intermittent    Aggravating Factors   bending it and straighten it          [facility] OT Assessment - 06/17/18 0001      Right Hand AROM   R Long  MCP 0-90  70 Degrees    R Long PIP 0-100  90 Degrees   -15      assess AROM for R 3rd digit - see flowsheet  cont to have 0.8 cm edema in PIP and prox phalanges of 3rd  After fluido done scar massage using vibration with great progress And graston tool nr 2 for brushing over palm and around scar - volar 3rd digit' PROM gentle and AROM for extention 3rd PIP - very little PROM needed - tapping 10 reps     fitted with isotoner glove to wear at work for compression and cont with same scar pad cica care night time and silicon sleeve on 3rd  OT Treatments/Exercises (OP) - 06/17/18 0001      Ultrasound   Ultrasound Location  over scar     Ultrasound Parameters  3. , 20% , 0.8 intensity     Ultrasound Goals  Edema;Pain      RUE Fluidotherapy   Number Minutes Fluidotherapy  8 Minutes    RUE Fluidotherapy Location  Hand    Comments  AROM for digits flexion and extention at [facility] to increase ROM - prior to soft tissue massage        Tenon glides done -focus on lumbrical fist and PIP extention with MC flexion at 90  Intrinsic fist and full fist   focus on digits extention  [place] at end       OT Education - 06/17/18 1400    Education Details  Review HEP and upgrade as needed     Person(s) Educated  Patient    Methods  Explanation;Demonstration    Comprehension  Verbalized understanding;Returned demonstration       OT Short Term Goals - 06/14/18 0929      OT SHORT TERM GOAL #1   Title  Pain on PRWHE improve with more than 15point     Baseline  at eval PRWHE for pain score 24/50     Time  2    Period  Weeks    Status  New     Target Date  06/28/18      OT SHORT TERM GOAL #2   Title  R 3rd digit extention improve to -5 degrees to get hand in pocket and gloves     Baseline  PIP extention -21 degrees - hard time reaching into pocket     Time  3    Period  Weeks    Status  New    Target Date  07/05/18        OT Long Term Goals - 06/14/18 0930      OT LONG TERM GOAL #1   Title  Pt to be ind in HEP to decrease edema more than .5 cm , decrease scar tissue and increase ROM to improve function more than 50%     Baseline  no knowledge of HEP - edema increase more than 0.8 cm , scar tissue thick  and ROM decrease in ext and flexion     Time  4    Period  Weeks    Status  New    Target Date  07/12/18      OT LONG TERM GOAL #2   Title  AROM in R hand improve in all digits to WNL to touch palm to use hand more than 75% of function on PRWHE     Baseline   AROM decrease flexion , and ext - and not using hand more than 75 % in functional tasks on PRWHE     Time  4    Period  Weeks    Status  New    Target Date  07/12/18            Plan - 06/17/18 1437    Clinical Impression Statement  Pt show increase AROM in flexion and extention- but still increase edema in 3rd and over volar plate where scar is - after flluido show great progress in ROM and soft tissue - pt fitted with isotoner glove to use during day on computer and to do more ice if needed - and cont same HEP for ROM - pt now about 3 wks out from  surgery     Occupational performance deficits (Please refer to evaluation for details):  ADL's;IADL's;Work;Play;Leisure    Rehab Potential  Good    Current Impairments/barriers affecting progress:  had trigger finger for more than year- and 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    OT Frequency  2x / week    OT Duration  4 weeks    OT Treatment/Interventions  Therapeutic exercise;Self-care/ADL training;Fluidtherapy;Contrast Bath;Ultrasound;Paraffin;Splinting;Patient/family education;Scar mobilization;Manual  Therapy;Passive range of motion    Plan  assess progress with HEP and change as needed     Clinical Decision Making  Several treatment options, min-mod task modification necessary    OT Home Exercise Plan  see pt instruction    Consulted and Agree with Plan of Care  Patient       Patient will benefit from skilled therapeutic intervention in order to improve the following deficits and impairments:  Increased edema, Impaired flexibility, Pain, Decreased scar mobility, Decreased strength, Decreased range of motion, Impaired UE functional use  Visit Diagnosis: Stiffness of right hand, not elsewhere classified  Localized edema  Pain in right hand  Scar condition and fibrosis of skin    Problem List Patient Active Problem List   Diagnosis Date Noted  . No blood products 05/11/2018  . Low vitamin D level 03/23/2018  . Microcytic hypochromic anemia 03/23/2018  . Anxiety and depression 01/14/2016    Oletta Cohn OTR/L,CLT 06/17/2018, 2:40 PM  [facility] [address] [place], [address] Phone: [phone]   Fax:  [phone]  Name: MELISHA EGGLETON MRN: [id number] Date of Birth: [birth year]

## 2018-06-17 NOTE — Patient Instructions (Signed)
Same   but do several times during day ice  And fitted with isotoner glove for day time  And silicon sleeve for night time and another one if cannot wear glove

## 2018-06-21 ENCOUNTER — Ambulatory Visit: Payer: BLUE CROSS/BLUE SHIELD | Admitting: Occupational Therapy

## 2018-06-21 DIAGNOSIS — R600 Localized edema: Secondary | ICD-10-CM

## 2018-06-21 DIAGNOSIS — L905 Scar conditions and fibrosis of skin: Secondary | ICD-10-CM

## 2018-06-21 DIAGNOSIS — M79641 Pain in right hand: Secondary | ICD-10-CM

## 2018-06-21 DIAGNOSIS — M25641 Stiffness of right hand, not elsewhere classified: Secondary | ICD-10-CM | POA: Diagnosis not present

## 2018-06-21 NOTE — Patient Instructions (Signed)
Same HEP - but increase ice  Cont AROM for flexion and extention of 3rd digit  Compression for edema

## 2018-06-21 NOTE — Therapy (Signed)
[place] [facility] [address] [place], [address] Phone: [phone]   Fax:  [phone]  Occupational Therapy Treatment  Patient Details  Name: Kathleen Burke MRN: [id number] Date of Birth: May 05, 1976 Referring Provider: Menz/ Floyce Stakes   Encounter Date: 06/21/2018  OT End of Session - 06/21/18 1742    Visit Number  3    Number of Visits  8    Date for OT Re-Evaluation  07/12/18    OT Start Time  1215    OT Stop Time  1255    OT Time Calculation (min)  40 min    Activity Tolerance  Patient tolerated treatment well    Behavior During Therapy  [facility] for tasks assessed/performed       Past Medical History:  Diagnosis Date  . Anemia   . Anxiety 2014  . Arthritis   . Chicken pox   . Depression   . Frequent headaches   . GERD (gastroesophageal reflux disease)   . Hypertension   . Obesity (BMI 30-39.9)   . Pre-diabetes   . Trigger finger    MIDDLE FINGER RIGHT HAND SURGERY ON JULY 31ST 2019    Past Surgical History:  Procedure Laterality Date  . HYSTEROSCOPY W/D&C N/A 05/20/2018   Procedure: DILATATION AND CURETTAGE /HYSTEROSCOPY;  Surgeon: Vena Austria, MD;  Location: [facility] ORS;  Service: Gynecology;  Laterality: N/A;  . INTRAUTERINE DEVICE (IUD) INSERTION N/A 05/20/2018   Procedure: INTRAUTERINE DEVICE (IUD) INSERTION;  Surgeon: Vena Austria, MD;  Location: [facility] ORS;  Service: Gynecology;  Laterality: N/A;  . WISDOM TOOTH EXTRACTION      There were no vitals filed for this visit.  Subjective Assessment - 06/21/18 1223    Subjective   I was painting yesterday - so that was maybe not the best thing to do - swelling still the same and finger not straigth yet     Patient Stated Goals  I want to be able to get my finger straight to get my gloves on , hand in pocket , grip ,carry objects     Currently in Pain?  Yes    Pain Score  3     Pain Location  Finger (Comment which one)    Pain Orientation   Right    Pain Descriptors / Indicators  Aching    Pain Type  Surgical pain    Pain Onset  1 to 4 weeks ago    Pain Frequency  Intermittent         [facility] OT Assessment - 06/21/18 0001      Right Hand AROM   R Long  MCP 0-90  75 Degrees    R Long PIP 0-100  90 Degrees        assess AROM for R 3rd digit - see flowsheet  cont to have 0.8 cm edema in PIP and prox phalanges of 3rd  After fluido done scar massage using vibration with great progress And graston tool nr 2 for brushing over palm and around scar - volar 3rd digit' PROM gentle and AROM for extention 3rd PIP - very little PROM needed - tapping 10 reps    fitted with isotoner glove to wear at work for compression and cont with same scar pad cica care night time and silicon sleeve on 3rd         OT Treatments/Exercises (OP) - 06/21/18 0001      Ultrasound   Ultrasound Location  over scar  Ultrasound Parameters  3. , 20%, 0.8 intensity     Ultrasound Goals  Edema;Pain      RUE Fluidotherapy   Number Minutes Fluidotherapy  8 Minutes    RUE Fluidotherapy Location  Hand    Comments  AROM for digit - at [facility] to increase ROM and decrease pian        Tenon glides done -focus on lumbrical fist and PIP extention with MC flexion at 90  Intrinsic fist and full fist   increase to PIP 96 degrees and MC  80   decrease edema and increase ROM - after ice -   focus on digits extention  - had full extention after heat and soft tissue   will still hold off on splint because of increase extention with heat , soft tissue  To 0 degrees extention  [place] at end  and ice        OT Education - 06/21/18 1741    Education Details  Cont to increase ROM -decrease edema and modifications to decrease stress on digits     Person(s) Educated  Patient    Methods  Explanation;Demonstration    Comprehension  Verbalized understanding;Returned demonstration       OT Short Term Goals - 06/14/18 0929      OT SHORT TERM GOAL #1    Title  Pain on PRWHE improve with more than 15point     Baseline  at eval PRWHE for pain score 24/50     Time  2    Period  Weeks    Status  New    Target Date  06/28/18      OT SHORT TERM GOAL #2   Title  R 3rd digit extention improve to -5 degrees to get hand in pocket and gloves     Baseline  PIP extention -21 degrees - hard time reaching into pocket     Time  3    Period  Weeks    Status  New    Target Date  07/05/18        OT Long Term Goals - 06/14/18 0930      OT LONG TERM GOAL #1   Title  Pt to be ind in HEP to decrease edema more than .5 cm , decrease scar tissue and increase ROM to improve function more than 50%     Baseline  no knowledge of HEP - edema increase more than 0.8 cm , scar tissue thick  and ROM decrease in ext and flexion     Time  4    Period  Weeks    Status  New    Target Date  07/12/18      OT LONG TERM GOAL #2   Title  AROM in R hand improve in all digits to WNL to touch palm to use hand more than 75% of function on PRWHE     Baseline   AROM decrease flexion , and ext - and not using hand more than 75 % in functional tasks on PRWHE     Time  4    Period  Weeks    Status  New    Target Date  07/12/18            Plan - 06/21/18 1743    Clinical Impression Statement  Pt show decrease AROM and increase edema in 3rd digit of R hand - pt did paint yesterday - ed pt on modifying and avoid tight and sustained grip .  Wear compression and do ice     Occupational performance deficits (Please refer to evaluation for details):  ADL's;IADL's;Work;Play;Leisure    Rehab Potential  Good    Current Impairments/barriers affecting progress:  had trigger finger for more than year- and 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    OT Frequency  2x / week    OT Duration  4 weeks    OT Treatment/Interventions  Therapeutic exercise;Self-care/ADL training;Fluidtherapy;Contrast Bath;Ultrasound;Paraffin;Splinting;Patient/family education;Scar mobilization;Manual  Therapy;Passive range of motion    Plan  assess progress with HEP and change as needed     Clinical Decision Making  Several treatment options, min-mod task modification necessary    OT Home Exercise Plan  see pt instruction    Consulted and Agree with Plan of Care  Patient       Patient will benefit from skilled therapeutic intervention in order to improve the following deficits and impairments:  Increased edema, Impaired flexibility, Pain, Decreased scar mobility, Decreased strength, Decreased range of motion, Impaired UE functional use  Visit Diagnosis: Stiffness of right hand, not elsewhere classified  Localized edema  Pain in right hand  Scar condition and fibrosis of skin    Problem List Patient Active Problem List   Diagnosis Date Noted  . No blood products 05/11/2018  . Low vitamin D level 03/23/2018  . Microcytic hypochromic anemia 03/23/2018  . Anxiety and depression 01/14/2016    Oletta CohnuPreez, Teigan Sahli OTR/L,CLT 06/21/2018, 5:50 PM  [facility] [address] [place], [place], [address] Phone: [phone]   Fax:  [phone]  Name: Massie KluverDenise J Higashi MRN: [id number] Date of Birth: 11/15/1975

## 2018-06-22 ENCOUNTER — Ambulatory Visit: Payer: BLUE CROSS/BLUE SHIELD | Admitting: Internal Medicine

## 2018-06-22 ENCOUNTER — Encounter: Payer: Self-pay | Admitting: Internal Medicine

## 2018-06-22 VITALS — BP 122/78 | HR 102 | Temp 98.6°F | Ht 66.0 in | Wt 238.4 lb

## 2018-06-22 DIAGNOSIS — I10 Essential (primary) hypertension: Secondary | ICD-10-CM | POA: Insufficient documentation

## 2018-06-22 DIAGNOSIS — R748 Abnormal levels of other serum enzymes: Secondary | ICD-10-CM | POA: Diagnosis not present

## 2018-06-22 DIAGNOSIS — M65331 Trigger finger, right middle finger: Secondary | ICD-10-CM | POA: Diagnosis not present

## 2018-06-22 DIAGNOSIS — M6530 Trigger finger, unspecified finger: Secondary | ICD-10-CM | POA: Insufficient documentation

## 2018-06-22 LAB — COMPREHENSIVE METABOLIC PANEL
ALT: 24 U/L (ref 0–35)
AST: 21 U/L (ref 0–37)
Albumin: 4.4 g/dL (ref 3.5–5.2)
Alkaline Phosphatase: 85 U/L (ref 39–117)
BUN: 13 mg/dL (ref 6–23)
CO2: 31 mEq/L (ref 19–32)
Calcium: 9.7 mg/dL (ref 8.4–10.5)
Chloride: 99 mEq/L (ref 96–112)
Creatinine, Ser: 0.94 mg/dL (ref 0.40–1.20)
GFR: 83.11 mL/min (ref >60.00)
Glucose, Bld: 103 mg/dL — ABNORMAL HIGH (ref 70–99)
Potassium: 3.4 mEq/L — ABNORMAL LOW (ref 3.5–5.1)
Sodium: 137 mEq/L (ref 135–145)
Total Bilirubin: 0.3 mg/dL (ref 0.2–1.2)
Total Protein: 7.9 g/dL (ref 6.0–8.3)

## 2018-06-22 NOTE — Progress Notes (Signed)
Chief Complaint  Patient presents with  . Follow-up   F/u doing well  1. HTN controlled on HCTZ 25 mg qd  2. Had trigger finger surgery 05/26/18 with Dr. Rudene Christians right third finger still with swelling doing PT now   Review of Systems  Constitutional: Negative for weight loss.  HENT: Negative for hearing loss.   Eyes: Negative for blurred vision.  Respiratory: Negative for shortness of breath.   Cardiovascular: Negative for chest pain.  Gastrointestinal: Negative for abdominal pain.  Skin: Negative for rash.  Neurological: Negative for headaches.  Psychiatric/Behavioral: Negative for depression.   Past Medical History:  Diagnosis Date  . Anemia   . Anxiety 2014  . Arthritis   . Chicken pox   . Depression   . Frequent headaches   . GERD (gastroesophageal reflux disease)   . Hypertension   . Obesity (BMI 30-39.9)   . Pre-diabetes   . Trigger finger    MIDDLE FINGER RIGHT HAND SURGERY ON JULY 31ST 2019   Past Surgical History:  Procedure Laterality Date  . HYSTEROSCOPY W/D&C N/A 05/20/2018   Procedure: DILATATION AND CURETTAGE /HYSTEROSCOPY;  Surgeon: [name], MD;  Location: [facility] ORS;  Service: Gynecology;  Laterality: N/A;  . INTRAUTERINE DEVICE (IUD) INSERTION N/A 05/20/2018   Procedure: INTRAUTERINE DEVICE (IUD) INSERTION;  Surgeon: [name], MD;  Location: [facility] ORS;  Service: Gynecology;  Laterality: N/A;  . WISDOM TOOTH EXTRACTION     Family History  Problem Relation Age of Onset  . Arthritis Mother   . Hyperlipidemia Mother   . Hypertension Mother   . Diabetes Mellitus I Father        DM I  . Breast cancer Maternal Grandmother 82  . Arthritis Paternal Grandmother   . Diabetes Mellitus II Paternal Grandmother   . Arthritis Paternal Grandfather   . Hyperlipidemia Paternal Grandfather   . Stroke Paternal Grandfather   . Hypertension Paternal Grandfather   . Diabetes Mellitus II Sister   . Diabetes Mellitus II Sister   . Hyperlipidemia Sister   .  Hypertension Sister    Social History   Socioeconomic History  . Marital status: Married    Spouse name: Not on file  . Number of children: 2  . Years of education: Not on file  . Highest education level: Not on file  Occupational History  . Occupation: Therapist, sports  Social Needs  . Financial resource strain: Not on file  . Food insecurity:    Worry: Not on file    Inability: Not on file  . Transportation needs:    Medical: Not on file    Non-medical: Not on file  Tobacco Use  . Smoking status: Never Smoker  . Smokeless tobacco: Never Used  Substance and Sexual Activity  . Alcohol use: Yes    Alcohol/week: 2.0 standard drinks    Types: 2 Glasses of wine per week    Comment: OCC-WEEKENDS  . Drug use: No  . Sexual activity: Yes    Partners: Male    Birth control/protection: Surgical    Comment: vasectomy  Lifestyle  . Physical activity:    Days per week: 0 days    Minutes per session: 0 min  . Stress: Not at all  Relationships  . Social connections:    Talks on phone: Not on file    Gets together: Not on file    Attends religious service: Not on file    Active member of club or organization: Not on file  Attends meetings of clubs or organizations: Not on file    Relationship status: Not on file  . Intimate partner violence:    Fear of current or ex partner: Not on file    Emotionally abused: Not on file    Physically abused: Not on file    Forced sexual activity: Not on file  Other Topics Concern  . Not on file  Social History Narrative   Married    2 kids    Works occup. [employer]    Current Meds  Medication Sig  . diazepam (VALIUM) 5 MG tablet 1 tablet once; 1 hour prior to flying.  . docusate sodium (COLACE) 100 MG capsule Take 100 mg by mouth daily.  . ferrous sulfate 325 (65 FE) MG tablet Take 325 mg by mouth daily with breakfast.  . hydrochlorothiazide (HYDRODIURIL) 25 MG tablet Take 1/2 tablet for 3 days then increase to 1 tablet daily in the am (Patient  taking differently: Take 25 mg by mouth daily. )  . HYDROcodone-acetaminophen (NORCO/VICODIN) 5-325 MG tablet Take 1 tablet by mouth every 6 (six) hours as needed.  Marland Kitchen ibuprofen (ADVIL,MOTRIN) 600 MG tablet Take 1 tablet (600 mg total) by mouth every 6 (six) hours as needed.  . Multiple Vitamins-Minerals (MULTIVITAMIN PO) Take 1 packet by mouth See admin instructions. [name] Supplement Multivitamin Packet - Take 6 tablets by mouth in the morning and take 7 tablets by mouth at bedtime  . ranitidine (ZANTAC) 150 MG capsule Take 150 mg by mouth as needed for heartburn.  . venlafaxine XR ([name]) 37.5 MG 24 hr capsule Take 1 capsule (37.5 mg total) by mouth daily. (Patient taking differently: Take 37.5 mg by mouth at bedtime. )   Allergies  Allergen Reactions  . Latex Itching    GLOVES   Recent Results (from the past 2160 hour(s))  CBC     Status: Abnormal   Collection Time: 05/18/18  8:14 AM  Result Value Ref Range   WBC 5.3 3.6 - 11.0 K/uL   RBC 4.12 3.80 - 5.20 MIL/uL   Hemoglobin 11.3 (L) 12.0 - 16.0 g/dL   HCT 33.7 (L) 35.0 - 47.0 %   MCV 81.8 80.0 - 100.0 fL   MCH 27.6 26.0 - 34.0 pg   MCHC 33.7 32.0 - 36.0 g/dL   RDW 19.8 (H) 11.5 - 14.5 %   Platelets 348 150 - 440 K/uL    Comment: Performed at [facility], [address] [place], [place] [address]  Potassium     Status: Abnormal   Collection Time: 05/18/18  8:14 AM  Result Value Ref Range   Potassium 3.4 (L) 3.5 - 5.1 mmol/L    Comment: Performed at [facility], [address] [place],  [address]  Pregnancy, urine POC     Status: None   Collection Time: 05/20/18  8:28 AM  Result Value Ref Range   Preg Test, Ur NEGATIVE NEGATIVE    Comment:        THE SENSITIVITY OF THIS METHODOLOGY IS >24 mIU/mL   Surgical pathology     Status: None   Collection Time: 05/20/18 10:10 AM  Result Value Ref Range   SURGICAL PATHOLOGY      Surgical Pathology CASE: [id number] PATIENT: Kathleen  Burke Surgical Pathology Report     SPECIMEN SUBMITTED: A. Endometrial polyps and currettings  CLINICAL HISTORY: None provided  PRE-OPERATIVE DIAGNOSIS: Endometrial polyp, abnormal uterine bleeding  POST-OPERATIVE DIAGNOSIS: None provided.     DIAGNOSIS:  A. ENDOMETRIUM; POLYPECTOMY AND CURETTAGE: - ENDOMETRIAL POLYP, IN FRAGMENTS, WITH SECRETORY CHANGE. - A FEW SMALL FRAGMENTS OF BASALIS-TYPE ENDOMETRIUM AND SCANT ENDOMETRIAL FRAGMENTS WITH BREAKDOWN. - NEGATIVE FOR ATYPIA AND MALIGNANCY.   GROSS DESCRIPTION: A. Labeled: Endometrial polyps and curetting Received: In formalin Tissue fragment(s): Multiple Size: Aggregate, 2.6 x 1.7 x 0.2 cm Description: Blood clot and rubbery pink tissue fragments Entirely submitted in one cassette.     Final Diagnosis performed by Bryan Lemma, MD.   Electronically signed 05/21/2018 3:38:20PM The electronic signature indicates that the named Attending Patholo gist has evaluated the specimen  Technical component performed at [facility], [address] [place], [place] [address] Lab: [phone] Dir: Rush Farmer, MD, MMM  Professional component performed at [facility], [facility], [place], [place],  [address] Lab: [phone] Dir: Dellia Nims. Rubinas, MD    Objective  Body mass index is 38.48 kg/m. Wt Readings from Last 3 Encounters:  06/22/18 238 lb 6.4 oz (108.1 kg)  05/28/18 236 lb (107 kg)  05/11/18 238 lb (108 kg)   Temp Readings from Last 3 Encounters:  06/22/18 98.6 F (37 C) (Oral)  05/20/18 97.7 F (36.5 C) (Temporal)  03/16/18 98.4 F (36.9 C) (Oral)   BP Readings from Last 3 Encounters:  06/22/18 122/78  05/28/18 (!) 158/90  05/20/18 136/86   Pulse Readings from Last 3 Encounters:  06/22/18 (!) 102  05/28/18 (!) 112  05/20/18 97    Physical Exam  Constitutional: She is oriented to person, place, and time. Vital signs are normal. She appears well-developed and  well-nourished. She is cooperative.  HENT:  Head: Normocephalic and atraumatic.  Mouth/Throat: Oropharynx is clear and moist and mucous membranes are normal.  Eyes: Pupils are equal, round, and reactive to light. Conjunctivae are normal.  Cardiovascular: Normal rate, regular rhythm and normal heart sounds.  Pulmonary/Chest: Effort normal and breath sounds normal.  Neurological: She is alert and oriented to person, place, and time. Gait normal.  Skin: Skin is warm, dry and intact.  Psychiatric: She has a normal mood and affect. Her speech is normal and behavior is normal. Judgment and thought content normal. Cognition and memory are normal.  Nursing note and vitals reviewed.   Assessment   1. HTN  2. Trigger finger with right 3rd finger swelling s/p surgery 05/26/18 Dr. Rudene Christians 3. HM Plan   1. Cont hctz 25 mg qd  Check cmet today  Given high K diet list  F/u in 6 months  2. F/u Dr. Rudene Christians  3.  Will get flu at work  utd tdap  Immune hep B and MMR  Pap [facility] 03/23/18 neg no HPV testing done  Endometrial Bx 05/20/18 negative with IUD now  mammo 06/15/18 negative  Declines HIV testing  rec take D3 2000 IUD daily    Provider: Dr. Olivia Mackie [name]-Internal Medicine

## 2018-06-22 NOTE — Progress Notes (Signed)
Pre visit review using our clinic review tool, if applicable. No additional management support is needed unless otherwise documented below in the visit note. 

## 2018-06-22 NOTE — Patient Instructions (Addendum)
D3 2000 IU daily  Try Exercise   Exercising to Lose Weight Exercising can help you to lose weight. In order to lose weight through exercise, you need to do vigorous-intensity exercise. You can tell that you are exercising with vigorous intensity if you are breathing very hard and fast and cannot hold a conversation while exercising. Moderate-intensity exercise helps to maintain your current weight. You can tell that you are exercising at a moderate level if you have a higher heart rate and faster breathing, but you are still able to hold a conversation. How often should I exercise? Choose an activity that you enjoy and set realistic goals. Your health care provider can help you to make an activity plan that works for you. Exercise regularly as directed by your health care provider. This may include:  Doing resistance training twice each week, such as: ? Push-ups. ? Sit-ups. ? Lifting weights. ? Using resistance bands.  Doing a given intensity of exercise for a given amount of time. Choose from these options: ? 150 minutes of moderate-intensity exercise every week. ? 75 minutes of vigorous-intensity exercise every week. ? A mix of moderate-intensity and vigorous-intensity exercise every week.  Children, pregnant women, people who are out of shape, people who are overweight, and older adults may need to consult a health care provider for individual recommendations. If you have any sort of medical condition, be sure to consult your health care provider before starting a new exercise program. What are some activities that can help me to lose weight?  Walking at a rate of at least 4.5 miles an hour.  Jogging or running at a rate of 5 miles per hour.  Biking at a rate of at least 10 miles per hour.  Lap swimming.  Roller-skating or in-line skating.  Cross-country skiing.  Vigorous competitive sports, such as football, basketball, and soccer.  Jumping rope.  Aerobic dancing. How can  I be more active in my day-to-day activities?  Use the stairs instead of the elevator.  Take a walk during your lunch break.  If you drive, park your car farther away from work or school.  If you take public transportation, get off one stop early and walk the rest of the way.  Make all of your phone calls while standing up and walking around.  Get up, stretch, and walk around every 30 minutes throughout the day. What guidelines should I follow while exercising?  Do not exercise so much that you hurt yourself, feel dizzy, or get very short of breath.  Consult your health care provider prior to starting a new exercise program.  Wear comfortable clothes and shoes with good support.  Drink plenty of water while you exercise to prevent dehydration or heat stroke. Body water is lost during exercise and must be replaced.  Work out until you breathe faster and your heart beats faster. This information is not intended to replace advice given to you by your health care provider. Make sure you discuss any questions you have with your health care provider. Document Released: 11/15/2010 Document Revised: 03/20/2016 Document Reviewed: 03/16/2014 Elsevier Interactive Patient Education  2018 [employer]  Cholesterol Cholesterol is a white, waxy, fat-like substance that is needed by the human body in small amounts. The liver makes all the cholesterol we need. Cholesterol is carried from the liver by the blood through the blood vessels. Deposits of cholesterol (plaques) may build up on blood vessel (artery) walls. Plaques make the arteries narrower and stiffer. Cholesterol plaques  increase the risk for heart attack and stroke. You cannot feel your cholesterol level even if it is very high. The only way to know that it is high is to have a blood test. Once you know your cholesterol levels, you should keep a record of the test results. Work with your health care provider to keep your levels in the  desired range. What do the results mean?  Total cholesterol is a rough measure of all the cholesterol in your blood.  LDL (low-density lipoprotein) is the "bad" cholesterol. This is the type that causes plaque to build up on the artery walls. You want this level to be low.  HDL (high-density lipoprotein) is the "good" cholesterol because it cleans the arteries and carries the LDL away. You want this level to be high.  Triglycerides are fat that the body can either burn for energy or store. High levels are closely linked to heart disease. What are the desired levels of cholesterol?  Total cholesterol below 200.  LDL below 100 for people who are at risk, below 70 for people at very high risk.  HDL above 40 is good. A level of 60 or higher is considered to be protective against heart disease.  Triglycerides below 150. How can I lower my cholesterol? Diet Follow your diet program as told by your health care provider.  Choose fish or white meat chicken and [place], roasted or baked. Limit fatty cuts of red meat, fried foods, and processed meats, such as sausage and lunch meats.  Eat lots of fresh fruits and vegetables.  Choose whole grains, beans, pasta, potatoes, and cereals.  Choose olive oil, corn oil, or canola oil, and use only small amounts.  Avoid butter, mayonnaise, shortening, or palm kernel oils.  Avoid foods with trans fats.  Drink skim or nonfat milk and eat low-fat or nonfat yogurt and cheeses. Avoid whole milk, cream, ice cream, egg yolks, and full-fat cheeses.  Healthier desserts include angel food cake, ginger snaps, animal crackers, hard candy, popsicles, and low-fat or nonfat frozen yogurt. Avoid pastries, cakes, pies, and cookies.  Exercise  Follow your exercise program as told by your health care provider. A regular program: ? Helps to decrease LDL and raise HDL. ? Helps with weight control.  Do things that increase your activity level, such as gardening,  walking, and taking the stairs.  Ask your health care provider about ways that you can be more active in your daily life.  Medicine  Take over-the-counter and prescription medicines only as told by your health care provider. ? Medicine may be prescribed by your health care provider to help lower cholesterol and decrease the risk for heart disease. This is usually done if diet and exercise have failed to bring down cholesterol levels. ? If you have several risk factors, you may need medicine even if your levels are normal.  This information is not intended to replace advice given to you by your health care provider. Make sure you discuss any questions you have with your health care provider. Document Released: 07/08/2001 Document Revised: 05/10/2016 Document Reviewed: 04/12/2016 Elsevier Interactive Patient Education  [employer].

## 2018-06-24 ENCOUNTER — Ambulatory Visit: Payer: BLUE CROSS/BLUE SHIELD | Admitting: Occupational Therapy

## 2018-06-24 DIAGNOSIS — M25641 Stiffness of right hand, not elsewhere classified: Secondary | ICD-10-CM

## 2018-06-24 DIAGNOSIS — R600 Localized edema: Secondary | ICD-10-CM

## 2018-06-24 DIAGNOSIS — L905 Scar conditions and fibrosis of skin: Secondary | ICD-10-CM

## 2018-06-24 DIAGNOSIS — M79641 Pain in right hand: Secondary | ICD-10-CM

## 2018-06-24 NOTE — Patient Instructions (Signed)
Same HEP  But add LMB dynamic PIP extention splint to use 5 x day for 5-15 min with slight stretch to increase PIP extention during day

## 2018-06-24 NOTE — Therapy (Signed)
[facility] [address], [place], [address] Phone: [phone]   Fax:  [phone]  Occupational Therapy Treatment  Patient Details  Name: Kathleen Burke MRN: [id number] Date of Birth: [birth year] Referring Provider: Menz/ Floyce Stakes   Encounter Date: 06/24/2018  OT End of Session - 06/24/18 1416    Visit Number  4    Number of Visits  8    Date for OT Re-Evaluation  07/12/18    OT Start Time  1050    OT Stop Time  1135    OT Time Calculation (min)  45 min    Activity Tolerance  Patient tolerated treatment well    Behavior During Therapy  [facility] for tasks assessed/performed       Past Medical History:  Diagnosis Date  . Anemia   . Anxiety 2014  . Arthritis   . Chicken pox   . Depression   . Frequent headaches   . GERD (gastroesophageal reflux disease)   . Hypertension   . Obesity (BMI 30-39.9)   . Pre-diabetes   . Trigger finger    MIDDLE FINGER RIGHT HAND SURGERY ON JULY 31ST 2019    Past Surgical History:  Procedure Laterality Date  . HYSTEROSCOPY W/D&C N/A 05/20/2018   Procedure: DILATATION AND CURETTAGE /HYSTEROSCOPY;  Surgeon: Vena Austria, MD;  Location: [facility] ORS;  Service: Gynecology;  Laterality: N/A;  . INTRAUTERINE DEVICE (IUD) INSERTION N/A 05/20/2018   Procedure: INTRAUTERINE DEVICE (IUD) INSERTION;  Surgeon: Vena Austria, MD;  Location: [facility] ORS;  Service: Gynecology;  Laterality: N/A;  . WISDOM TOOTH EXTRACTION      There were no vitals filed for this visit.  Subjective Assessment - 06/24/18 1413    Subjective   My finger little better- I think the scar and edema- pain little better did not over use it -did work some on the straightening - but it wants to bend every time again     Patient Stated Goals  I want to be able to get my finger straight to get my gloves on , hand in pocket , grip ,carry objects     Pain Score  2     Pain Location  Finger (Comment which one)    Pain  Orientation  Right    Pain Descriptors / Indicators  Aching    Pain Type  Surgical pain    Pain Onset  More than a month ago    Pain Frequency  Intermittent         [facility] OT Assessment - 06/24/18 0001      Right Hand AROM   R Long  MCP 0-90  75 Degrees   80 in session - other hand 3rd MC 80    R Long PIP 0-100  --   90      Pt ed on use of LMB PIP extention splint - 5 x day - 5-15 min   for slight pull or stretch for extention of 3rd PIP     assess AROM for R 3rd digit - see flowsheet      OT Treatments/Exercises (OP) - 06/24/18 0001      Ultrasound   Ultrasound Location  over scar     Ultrasound Parameters  3. , 20% 0.8 intensity     Ultrasound Goals  Edema;Pain      RUE Fluidotherapy   Number Minutes Fluidotherapy  10 Minutes    RUE Fluidotherapy Location  Hand    Comments  AROM with ice after 5 min for 2 min - increase ROM and decrease pain          After fluido done scar massage using vibration with great success And graston tool nr 2 for brushing over palm and around scar - volar 3rd digit' AROM for extention 3rd PIP - very little PROM needed - for full extention   pt still unable to maintain extention  tapping 10 reps    Pt to cont with  isotoner glove to wear at work for compression and cont with new scar pad cica care night time and silicon sleeve on 3rd      OT Education - 06/24/18 1415    Education Details  use of dynamic extention splint to PIP     Person(s) Educated  Patient    Methods  Explanation;Demonstration    Comprehension  Verbalized understanding;Returned demonstration       OT Short Term Goals - 06/14/18 0929      OT SHORT TERM GOAL #1   Title  Pain on PRWHE improve with more than 15point     Baseline  at eval PRWHE for pain score 24/50     Time  2    Period  Weeks    Status  New    Target Date  06/28/18      OT SHORT TERM GOAL #2   Title  R 3rd digit extention improve to -5 degrees to get hand in pocket and  gloves     Baseline  PIP extention -21 degrees - hard time reaching into pocket     Time  3    Period  Weeks    Status  New    Target Date  07/05/18        OT Long Term Goals - 06/14/18 0930      OT LONG TERM GOAL #1   Title  Pt to be ind in HEP to decrease edema more than .5 cm , decrease scar tissue and increase ROM to improve function more than 50%     Baseline  no knowledge of HEP - edema increase more than 0.8 cm , scar tissue thick  and ROM decrease in ext and flexion     Time  4    Period  Weeks    Status  New    Target Date  07/12/18      OT LONG TERM GOAL #2   Title  AROM in R hand improve in all digits to WNL to touch palm to use hand more than 75% of function on PRWHE     Baseline   AROM decrease flexion , and ext - and not using hand more than 75 % in functional tasks on PRWHE     Time  4    Period  Weeks    Status  New    Target Date  07/12/18            Plan - 06/24/18 1416    Clinical Impression Statement  Pt cont to show increase scar tissue , pain , edema and decrease PIP flexion and extention - but in session AROM improve to WNL - scar tissue improve and edema -add this date dynamic splint to HEP     Occupational performance deficits (Please refer to evaluation for details):  ADL's;IADL's;Work;Play;Leisure    Rehab Potential  Good    Current Impairments/barriers affecting progress:  had trigger finger for more than year- and 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  OT Frequency  2x / week    OT Duration  4 weeks    OT Treatment/Interventions  Therapeutic exercise;Self-care/ADL training;Fluidtherapy;Contrast Bath;Ultrasound;Paraffin;Splinting;Patient/family education;Scar mobilization;Manual Therapy;Passive range of motion    Plan  assess progress with HEP and use of LMB PIP extention splint     Clinical Decision Making  Several treatment options, min-mod task modification necessary    OT Home Exercise Plan  see pt instruction    Consulted and Agree  with Plan of Care  Patient       Patient will benefit from skilled therapeutic intervention in order to improve the following deficits and impairments:  Increased edema, Impaired flexibility, Pain, Decreased scar mobility, Decreased strength, Decreased range of motion, Impaired UE functional use  Visit Diagnosis: Stiffness of right hand, not elsewhere classified  Localized edema  Pain in right hand  Scar condition and fibrosis of skin    Problem List Patient Active Problem List   Diagnosis Date Noted  . HTN (hypertension) 06/22/2018  . Trigger finger, right 06/22/2018  . No blood products 05/11/2018  . Low vitamin D level 03/23/2018  . Microcytic hypochromic anemia 03/23/2018  . Anxiety and depression 01/14/2016  . Prediabetes 01/14/2016    Oletta Cohn OTR/L,CLT 06/24/2018, 3:35 PM  [place] [facility] [address] [place], [address] Phone: [phone]   Fax:  [phone]  Name: Kathleen Burke MRN: [id number] Date of Birth: 16-Apr-1976

## 2018-06-25 ENCOUNTER — Encounter: Payer: Self-pay | Admitting: Internal Medicine

## 2018-06-25 DIAGNOSIS — I10 Essential (primary) hypertension: Secondary | ICD-10-CM

## 2018-06-25 MED ORDER — HYDROCHLOROTHIAZIDE 25 MG PO TABS: ORAL_TABLET | ORAL | 1 refills | Status: DC

## 2018-07-01 ENCOUNTER — Ambulatory Visit: Payer: BLUE CROSS/BLUE SHIELD | Admitting: Occupational Therapy

## 2018-07-05 ENCOUNTER — Ambulatory Visit: Payer: BLUE CROSS/BLUE SHIELD | Admitting: Obstetrics and Gynecology

## 2018-07-06 ENCOUNTER — Ambulatory Visit: Payer: BLUE CROSS/BLUE SHIELD | Attending: Orthopedic Surgery | Admitting: Occupational Therapy

## 2018-07-06 DIAGNOSIS — R600 Localized edema: Secondary | ICD-10-CM | POA: Insufficient documentation

## 2018-07-06 DIAGNOSIS — M79641 Pain in right hand: Secondary | ICD-10-CM | POA: Insufficient documentation

## 2018-07-06 DIAGNOSIS — L905 Scar conditions and fibrosis of skin: Secondary | ICD-10-CM | POA: Insufficient documentation

## 2018-07-06 DIAGNOSIS — M25641 Stiffness of right hand, not elsewhere classified: Secondary | ICD-10-CM | POA: Insufficient documentation

## 2018-07-08 ENCOUNTER — Ambulatory Visit: Payer: BLUE CROSS/BLUE SHIELD | Admitting: Occupational Therapy

## 2018-07-08 DIAGNOSIS — M79641 Pain in right hand: Secondary | ICD-10-CM | POA: Diagnosis present

## 2018-07-08 DIAGNOSIS — R600 Localized edema: Secondary | ICD-10-CM | POA: Diagnosis present

## 2018-07-08 DIAGNOSIS — M25641 Stiffness of right hand, not elsewhere classified: Secondary | ICD-10-CM

## 2018-07-08 DIAGNOSIS — L905 Scar conditions and fibrosis of skin: Secondary | ICD-10-CM

## 2018-07-08 NOTE — Therapy (Signed)
[place] [facility] [address] [place], [address] Phone: [phone]   Fax:  [phone]  Occupational Therapy Treatment  Patient Details  Name: Kathleen Burke MRN: [id number] Date of Birth: 09-Dec-1975 Referring Provider: Menz/ Floyce Stakes   Encounter Date: 07/08/2018  OT End of Session - 07/08/18 1315    Visit Number  5    Number of Visits  8    Date for OT Re-Evaluation  07/12/18    OT Start Time  1100    OT Stop Time  1131    OT Time Calculation (min)  31 min    Activity Tolerance  Patient tolerated treatment well    Behavior During Therapy  [facility] for tasks assessed/performed       Past Medical History:  Diagnosis Date  . Anemia   . Anxiety 2014  . Arthritis   . Chicken pox   . Depression   . Frequent headaches   . GERD (gastroesophageal reflux disease)   . Hypertension   . Obesity (BMI 30-39.9)   . Pre-diabetes   . Trigger finger    MIDDLE FINGER RIGHT HAND SURGERY ON JULY 31ST 2019    Past Surgical History:  Procedure Laterality Date  . HYSTEROSCOPY W/D&C N/A 05/20/2018   Procedure: DILATATION AND CURETTAGE /HYSTEROSCOPY;  Surgeon: Vena Austria, MD;  Location: [facility] ORS;  Service: Gynecology;  Laterality: N/A;  . INTRAUTERINE DEVICE (IUD) INSERTION N/A 05/20/2018   Procedure: INTRAUTERINE DEVICE (IUD) INSERTION;  Surgeon: Vena Austria, MD;  Location: [facility] ORS;  Service: Gynecology;  Laterality: N/A;  . WISDOM TOOTH EXTRACTION      There were no vitals filed for this visit.  Subjective Assessment - 07/08/18 1311    Subjective   Seen Dr Rosita Kea- he said the swelling will improve in the next few weeks still - and to see you - he did tell me to get paraffin bath - my compression socks is stretch out  - no pain now - my husband has been massaging my scar     Patient Stated Goals  I want to be able to get my finger straight to get my gloves on , hand in pocket , grip ,carry objects     Currently in  Pain?  No/denies         [facility] OT Assessment - 07/08/18 0001      Strength   Right Hand Grip (lbs)  35    Left Hand Grip (lbs)  67      Right Hand AROM   R Long  MCP 0-90  80 Degrees    R Long PIP 0-100  95 Degrees   -10      Good progress in AROM in R hand since 2 wks ago - scar tissue improving with husband helping her -  Cont to have 0.6 cm  increase edema in PIP and proximal phalanges New silicon sleeves provided     did show pt use of Paraffin bath - per recommendation of surgeon - pt would not recommend at this time because of her cont edema , only 6 wks s/p and pt showed in session harder time doing flexion - because of increase edema afterwards      OT Treatments/Exercises (OP) - 07/08/18 0001      RUE Paraffin   Number Minutes Paraffin  8 Minutes    RUE Paraffin Location  Hand    Comments  prior to scar massage and ROM - had  increase tightness and edema afterwards in PIPI       scar massage using vibration with great success- progressing well  And graston tool nr 2 for brushing over palm and around scar - volar 3rd digit' AROM for extention 3rd PIP - very little PROM needed - for full extention   pt still unable to maintain extention  tapping 10 reps     cont with scar pad cica care night time and silicon sleeve on 3rd - 2 new ones provided     AAROM and AROM for MC flexion , intrinsic fist and full fist done  extention done   Ice done at end  OT Education - 07/08/18 1315    Education Details  HEP , scar massage, that paraffin not indicated at this time - will increase edema - only 6 wks s/p and has edema     Person(s) Educated  Patient    Methods  Explanation;Demonstration    Comprehension  Verbalized understanding;Returned demonstration       OT Short Term Goals - 06/14/18 0929      OT SHORT TERM GOAL #1   Title  Pain on PRWHE improve with more than 15point     Baseline  at eval PRWHE for pain score 24/50     Time  2    Period  Weeks     Status  New    Target Date  06/28/18      OT SHORT TERM GOAL #2   Title  R 3rd digit extention improve to -5 degrees to get hand in pocket and gloves     Baseline  PIP extention -21 degrees - hard time reaching into pocket     Time  3    Period  Weeks    Status  New    Target Date  07/05/18        OT Long Term Goals - 06/14/18 0930      OT LONG TERM GOAL #1   Title  Pt to be ind in HEP to decrease edema more than .5 cm , decrease scar tissue and increase ROM to improve function more than 50%     Baseline  no knowledge of HEP - edema increase more than 0.8 cm , scar tissue thick  and ROM decrease in ext and flexion     Time  4    Period  Weeks    Status  New    Target Date  07/12/18      OT LONG TERM GOAL #2   Title  AROM in R hand improve in all digits to WNL to touch palm to use hand more than 75% of function on PRWHE     Baseline   AROM decrease flexion , and ext - and not using hand more than 75 % in functional tasks on PRWHE     Time  4    Period  Weeks    Status  New    Target Date  07/12/18            Plan - 07/08/18 1316    Clinical Impression Statement  Pt was seen about 2 wks ago - pt followed up with surgeon yesterday - compare to 2 wks ago - pt AROM in flexion and extention improving -and scar tissue - husband is helping her with scar massge lately - pt still had 0.6 edema increase at PIP and proximal phalanges- pt to cont with same HEP  for 2 wks -  would not recommend at this time paraffin bath because of increase edema in digist and hand - pt only 6 wks s/p     Occupational performance deficits (Please refer to evaluation for details):  ADL's;IADL's;Work;Play;Leisure    Rehab Potential  Good    Current Impairments/barriers affecting progress:  had trigger finger for more than year- and 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    OT Frequency  Biweekly    OT Duration  4 weeks    OT Treatment/Interventions  Therapeutic exercise;Self-care/ADL  training;Fluidtherapy;Contrast Bath;Ultrasound;Paraffin;Splinting;Patient/family education;Scar mobilization;Manual Therapy;Passive range of motion    Plan  assess progress with HEP and use of LMB PIP extention splint     Clinical Decision Making  Several treatment options, min-mod task modification necessary    OT Home Exercise Plan  see pt instruction    Consulted and Agree with Plan of Care  Patient       Patient will benefit from skilled therapeutic intervention in order to improve the following deficits and impairments:  Increased edema, Impaired flexibility, Pain, Decreased scar mobility, Decreased strength, Decreased range of motion, Impaired UE functional use  Visit Diagnosis: Stiffness of right hand, not elsewhere classified  Localized edema  Pain in right hand  Scar condition and fibrosis of skin    Problem List Patient Active Problem List   Diagnosis Date Noted  . HTN (hypertension) 06/22/2018  . Trigger finger, right 06/22/2018  . No blood products 05/11/2018  . Low vitamin D level 03/23/2018  . Microcytic hypochromic anemia 03/23/2018  . Anxiety and depression 01/14/2016  . Prediabetes 01/14/2016    Oletta Cohn OTR/L,CLT  07/08/2018, 1:19 PM  [place] [facility] [address] [place], [address] Phone: [phone]   Fax:  [phone]  Name: Kathleen Burke MRN: [id number] Date of Birth: 1976-10-16

## 2018-07-08 NOTE — Patient Instructions (Signed)
Same HEP that she did the last 2 wks   husband doing scar massage- focus on radial side of scar cica scar pad  LMB splint for PIP extention  And provided new silicon compression sleeve

## 2018-07-09 ENCOUNTER — Ambulatory Visit: Payer: BLUE CROSS/BLUE SHIELD | Admitting: Obstetrics and Gynecology

## 2018-07-12 ENCOUNTER — Encounter: Payer: Self-pay | Admitting: Obstetrics and Gynecology

## 2018-07-12 ENCOUNTER — Ambulatory Visit (INDEPENDENT_AMBULATORY_CARE_PROVIDER_SITE_OTHER): Payer: BLUE CROSS/BLUE SHIELD | Admitting: Obstetrics and Gynecology

## 2018-07-12 VITALS — BP 138/92 | HR 103 | Ht 66.0 in | Wt 233.0 lb

## 2018-07-12 DIAGNOSIS — Z4889 Encounter for other specified surgical aftercare: Secondary | ICD-10-CM

## 2018-07-12 DIAGNOSIS — Z30431 Encounter for routine checking of intrauterine contraceptive device: Secondary | ICD-10-CM

## 2018-07-12 NOTE — Progress Notes (Signed)
      Postoperative Follow-up Patient presents post op from hysteroscopy, D&C, and Mirena IUD placement 6weeks ago for abnormal uterine bleeding.  Subjective: Patient reports marked improvement in her preop symptoms. Eating a regular diet without difficulty. The patient is not having any pain.  Activity: no limitations  Objective: Blood pressure (!) 138/92, pulse (!) 103, height 5\' 6"  (1.676 m), weight 233 lb (105.7 kg).  Gen: NAD HEENTL normocephalic, anicteric Pulm: no increased work of breathing GU: normal external female genitalia, IUD string visualized, no bleeding.    Office Visit on 06/22/2018  Component Date Value Ref Range Status  . Sodium 06/22/2018 137  135 - 145 mEq/L Final  . Potassium 06/22/2018 3.4* 3.5 - 5.1 mEq/L Final  . Chloride 06/22/2018 99  96 - 112 mEq/L Final  . CO2 06/22/2018 31  19 - 32 mEq/L Final  . Glucose, Bld 06/22/2018 103* 70 - 99 mg/dL Final  . BUN 16/10/960408/27/2019 13  6 - 23 mg/dL Final  . Creatinine, Ser 06/22/2018 0.94  0.40 - 1.20 mg/dL Final  . Total Bilirubin 06/22/2018 0.3  0.2 - 1.2 mg/dL Final  . Alkaline Phosphatase 06/22/2018 85  39 - 117 U/L Final  . AST 06/22/2018 21  0 - 37 U/L Final  . ALT 06/22/2018 24  0 - 35 U/L Final  . Total Protein 06/22/2018 7.9  6.0 - 8.3 g/dL Final  . Albumin 54/09/811908/27/2019 4.4  3.5 - 5.2 g/dL Final  . Calcium 14/78/295608/27/2019 9.7  8.4 - 10.5 mg/dL Final  . GFR 21/30/865708/27/2019 83.11  >60.00 mL/min Final    Assessment: 42 y.o. s/p Hysteroscopy, D&C, Mirena IUD placement stable  Plan: Patient has done well after surgery with no apparent complications.  I have discussed the post-operative course to date, and the expected progress moving forward.  The patient understands what complications to be concerned about.  I will see the patient in routine follow up, or sooner if needed.    Activity plan: No restriction.   Vena AustriaAndreas Bernhard Koskinen, MD, Merlinda FrederickFACOG [facility], [facility] 07/12/2018, 11:39 AM

## 2018-07-23 ENCOUNTER — Ambulatory Visit: Payer: BLUE CROSS/BLUE SHIELD | Admitting: Occupational Therapy

## 2018-07-23 DIAGNOSIS — L905 Scar conditions and fibrosis of skin: Secondary | ICD-10-CM

## 2018-07-23 DIAGNOSIS — M25641 Stiffness of right hand, not elsewhere classified: Secondary | ICD-10-CM | POA: Diagnosis not present

## 2018-07-23 DIAGNOSIS — M79641 Pain in right hand: Secondary | ICD-10-CM

## 2018-07-23 DIAGNOSIS — R600 Localized edema: Secondary | ICD-10-CM

## 2018-07-23 NOTE — Patient Instructions (Signed)
Same HEP  

## 2018-07-23 NOTE — Therapy (Signed)
[facility] [address] [place], [place], [address] Phone: [phone]   Fax:  [phone]  Occupational Therapy Treatment  Patient Details  Name: Kathleen Burke MRN: [id number] Date of Birth: 03/28/1976 No data recorded  Encounter Date: 07/23/2018  OT End of Session - 07/23/18 1208    Visit Number  6    Number of Visits  8    Date for OT Re-Evaluation  08/20/18    OT Start Time  1058    OT Stop Time  1130    OT Time Calculation (min)  32 min    Activity Tolerance  Patient tolerated treatment well    Behavior During Therapy  WFL for tasks assessed/performed       Past Medical History:  Diagnosis Date  . Anemia   . Anxiety 2014  . Arthritis   . Chicken pox   . Depression   . Frequent headaches   . GERD (gastroesophageal reflux disease)   . Hypertension   . Obesity (BMI 30-39.9)   . Pre-diabetes   . Trigger finger    MIDDLE FINGER RIGHT HAND SURGERY ON JULY 31ST 2019    Past Surgical History:  Procedure Laterality Date  . HYSTEROSCOPY W/D&C N/A 05/20/2018   Procedure: DILATATION AND CURETTAGE /HYSTEROSCOPY;  Surgeon: Staebler, Andreas, MD;  Location: [facility];  Service: Gynecology;  Laterality: N/A;  . INTRAUTERINE DEVICE (IUD) INSERTION N/A 05/20/2018   Procedure: INTRAUTERINE DEVICE (IUD) INSERTION;  Surgeon: Staebler, Andreas, MD;  Location: [facility];  Service: Gynecology;  Laterality: N/A;  . WISDOM TOOTH EXTRACTION      There were no vitals filed for this visit.  Subjective Assessment - 07/23/18 1128    Subjective   Doing better- my husband still doing the scar massage and then if I work it I can get it straight and can touch my palm making fist - everybody is asking me if I am squeezing ball     Patient Stated Goals  I want to be able to get my finger straight to get my gloves on , hand in pocket , grip ,carry objects     Currently in Pain?  No/denies         [facility] OT Assessment - 07/23/18  0001      Strength   Right Hand Grip (lbs)  46    Left Hand Grip (lbs)  67      Right Hand AROM   R Long  MCP 0-90  80 Degrees    R Long PIP 0-100  100 Degrees   -7 but able to get full extention without PROM       Pt scar feels great - softer and less adhesion -  Measure extention of 3rd PIP - and flexion   see flowsheet  after measurement -and few reps of AROM - was able to make full extention of PIP  Grip increase by 10 lbs  And still edema over scar and MC - but improving -remind pt she is only 8 1/2 wks s/p          OT Treatments/Exercises (OP) - 07/23/18 0001      Ultrasound   Ultrasound Location  over scar    Ultrasound Parameters  3.<MEASUR<MEASURE E1[place] n1[place]  11[place] Fle[place] <ME<MEASUREMENT S1[place] Fl<MEA U1O<MEAS R1[place] Flemin 41Od[place] Flemin8<MEA U1[place] Flemin26 WAd[facility] Son[facility]She[facility] OsbMid[facility] Center LLCBetti Cr20 dotherapy  8 Minutes    RUE Fluidotherapy Location  Hand    Comments  AROM tendon glides - prior to soft tissue        scar massage using vibration with greatsuccess- progressing well  And graston tool nr 2 for brushing over palm and around scar - volar 3rd digit' AROM for extention 3rd PIP -for full extention    tapping 10 reps  And can roll digits extention - over cylinder object    cont withscar pad cica care night time AAROM and AROM for MC flexion , intrinsic fist and full fist done  extention done  US done at end of session         OT Education - 07/23/18 1207    Education Details  cont with ROM , scar massage , ice massage -     Person(s) Educated  Patient    Methods  Explanation;Demonstration    Comprehension  Verbalized understanding;Returned demonstration       OT Short Term Goals - 07/23/18 1444      OT SHORT TERM GOAL #1   Title  Pain on PRWHE improve with more than 15point     Baseline  at eval PRWHE for pain score 24/50 - improve greatly - will reassess next time     Time  2    Period  Weeks    Target Date  08/06/18      OT SHORT TERM GOAL #2   Title  R 3rd digit extention improve to -5  degrees to get hand in pocket and gloves     Baseline  PIP extention -21 degrees - and now -7     Time  2    Period  Weeks    Status  On-going    Target Date  08/06/18        OT Long Term Goals - 07/23/18 1445      OT LONG TERM GOAL #1   Title  Pt to be ind in HEP to decrease edema more than .5 cm , decrease scar tissue and increase ROM to improve function more than 50%     Status  Achieved      OT LONG TERM GOAL #2   Title  AROM in R hand improve in all digits to WNL to touch palm to use hand more than 75% of function on PRWHE     Status  Achieved            Plan - 07/23/18 1442    Clinical Impression Statement  Pt was seen 2 wks ago and this date show decrease scar tissue adhesion and tenderness- extention at PIP still -7 but was able to extend to 0 in clinic - and flexion 95 at PIP - MC still decrease by 5 degrees and some edema over MC and scar - grip increase by 10 lbs - pt to cont with HEP for 2 more wks - she is only about 8 1/2 wks s/p - and normal to have some edema and not end range yet- willl bring her for follow up at 11 wks again     Occupational performance deficits (Please refer to evaluation for details):  ADL's;IADL's;Work;Play;Leisure    Rehab Potential  Good    Current Impairments/barriers affecting progress:  had trigger finger for more than year- and 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    OT Frequency  Biweekly    OT Duration  4 weeks    OT Treatment/Interventions  Therapeutic exercise;Self-care/ADL training;Fluidtherapy;Contrast Bath;Ultrasound;Paraffin;Splinting;Patient/family education;Scar mobilization;Manual Therapy;Passive range of motion    Plan  assess progress with  HEP and use of LMB PIP extention splint     Clinical Decision Making  Limited treatment options, no task modification necessary    OT Home Exercise Plan  see pt instruction       Patient will benefit from skilled therapeutic intervention in order to improve the following deficits  and impairments:  Increased edema, Impaired flexibility, Pain, Decreased scar mobility, Decreased strength, Decreased range of motion, Impaired UE functional use  Visit Diagnosis: Stiffness of right hand, not elsewhere classified  Localized edema  Pain in right hand  Scar condition and fibrosis of skin    Problem List Patient Active Problem List   Diagnosis Date Noted  . HTN (hypertension) 06/22/2018  . Trigger finger, right 06/22/2018  . No blood products 05/11/2018  . Low vitamin D level 03/23/2018  . Microcytic hypochromic anemia 03/23/2018  . Anxiety and depression 01/14/2016  . Prediabetes 01/14/2016    Oletta Cohn OTR/L,CLT 07/23/2018, 2:46 PM  [facility] [address], [place], [address] Phone: [phone]   Fax:  [phone]  Name: Kathleen Burke MRN: [id number] Date of Birth: 19-Nov-1975

## 2018-08-11 ENCOUNTER — Ambulatory Visit: Payer: BLUE CROSS/BLUE SHIELD | Attending: Orthopedic Surgery | Admitting: Occupational Therapy

## 2018-08-11 DIAGNOSIS — M25641 Stiffness of right hand, not elsewhere classified: Secondary | ICD-10-CM

## 2018-08-11 DIAGNOSIS — L905 Scar conditions and fibrosis of skin: Secondary | ICD-10-CM | POA: Diagnosis present

## 2018-08-11 DIAGNOSIS — R600 Localized edema: Secondary | ICD-10-CM

## 2018-08-11 DIAGNOSIS — M79641 Pain in right hand: Secondary | ICD-10-CM | POA: Insufficient documentation

## 2018-08-11 NOTE — Therapy (Signed)
[place] [address] [place], [address] Phone: [phone]   Fax:  [phone]  Occupational Therapy Treatment  Patient Details  Name: Kathleen Burke MRN: [id number] Date of Birth: [birth year] No data recorded  Encounter Date: 08/11/2018  OT End of Session - 08/11/18 1405    Visit Number  7    Number of Visits  8    Date for OT Re-Evaluation  08/20/18    OT Start Time  1301    OT Stop Time  1332    OT Time Calculation (min)  31 min    Activity Tolerance  Patient tolerated treatment well    Behavior During Therapy  [facility] for tasks assessed/performed       Past Medical History:  Diagnosis Date  . Anemia   . Anxiety 2014  . Arthritis   . Chicken pox   . Depression   . Frequent headaches   . GERD (gastroesophageal reflux disease)   . Hypertension   . Obesity (BMI 30-39.9)   . Pre-diabetes   . Trigger finger    MIDDLE FINGER RIGHT HAND SURGERY ON JULY 31ST 2019    Past Surgical History:  Procedure Laterality Date  . HYSTEROSCOPY W/D&C N/A 05/20/2018   Procedure: DILATATION AND CURETTAGE /HYSTEROSCOPY;  Surgeon: Vena Austria, MD;  Location: [facility];  Service: Gynecology;  Laterality: N/A;  . INTRAUTERINE DEVICE (IUD) INSERTION N/A 05/20/2018   Procedure: INTRAUTERINE DEVICE (IUD) INSERTION;  Surgeon: Vena Austria, MD;  Location: [facility];  Service: Gynecology;  Laterality: N/A;  . WISDOM TOOTH EXTRACTION      There were no vitals filed for this visit.  Subjective Assessment - 08/11/18 1335    Subjective   Doing okay - cannot straigthen it yet - but if I warm it up and work it - it get straigth -but then I goes back to bending- I do use in in most all act - little trouble getting like change out of my purse - and then my husband still doing my scar massage     Patient Stated Goals  I want to be able to get my finger straight to get my gloves on , hand in pocket , grip ,carry objects     Currently in Pain?  Yes    Pain Score  1     Pain Location  Hand    Pain Orientation  Right    Pain Descriptors / Indicators  Aching    Pain Type  Surgical pain    Pain Onset  More than a month ago    Aggravating Factors   When it is rainy or cold          [facility] OT Assessment - 08/11/18 0001      Strength   Right Hand Grip (lbs)  55    Left Hand Grip (lbs)  67      Right Hand AROM   R Long  MCP 0-90  85 Degrees    R Long PIP 0-100  100 Degrees   -15     Left Hand AROM   L Long  MCP 0-90  85 Degrees    L Long PIP 0-100  100 Degrees        assess AROM , scar and grip/prehension strength - see flow sheet Pt made great progress from [facility] in scar tissue, AROM for flexion and extention - see flow sheet   grip increase 10 lbs since last  time  Pt ed on dexterity HEP to do for [facility] - pt report some issues getting change out of pocketbook  3-5 small objects - fingers <> palm - pick up and retrieve And tripod grip walk  On thick pen         OT Treatments/Exercises (OP) - 08/11/18 0001      RUE Fluidotherapy   Number Minutes Fluidotherapy  8 Minutes    RUE Fluidotherapy Location  Hand    Comments  prior to soft tissue massage and ROM          scar massage using vibration with greatsuccess- progressing wellwith husband doing scar mobs  And graston tool nr 2 for brushing over palm and around scar - volar 3rd digit AROM for extention 3rd PIP -for full extention after fluido    tapping 10 reps  And can roll digits extention - over cylinder object   cont withscar massage  AAROM and AROM for MC flexion , intrinsic fist and full fist  Focus on extention  Pt had trigger finger and flexor contracture for about year prior to surgery - and only 10 wks now s/p - will take about 3 months to 6 months      OT Education - 08/11/18 1405    Education Details  progress and healing - HEP     Person(s) Educated  Patient    Methods  Explanation;Demonstration     Comprehension  Verbalized understanding;Returned demonstration       OT Short Term Goals - 08/11/18 1408      OT SHORT TERM GOAL #1   Title  Pain on PRWHE improve with more than 15point     Baseline  at eval PRWHE for pain score 24/50 - 6/50 now     Status  Achieved      OT SHORT TERM GOAL #2   Title  R 3rd digit extention improve to -5 degrees to get hand in pocket and gloves     Baseline  PIP extention -21 degrees - and now -7 to -15 depend on weather     Time  2    Period  Weeks    Status  On-going    Target Date  08/20/18        OT Long Term Goals - 08/11/18 1409      OT LONG TERM GOAL #1   Title  Pt to be ind in HEP to decrease edema more than .5 cm , decrease scar tissue and increase ROM to improve function more than 50%     Status  Achieved      OT LONG TERM GOAL #2   Title  AROM in R hand improve in all digits to WNL to touch palm to use hand more than 75% of function on PRWHE     Status  Achieved            Plan - 08/11/18 1406    Clinical Impression Statement  Pt was not seen for 3 wks- pt done HEP- report increase functional use with no issues - except some dexterity - provided her HEP  - and show increase Flexion -compare to L hand - same- but cannot make tight fist- still some edema in PIP -and MC extention -15 this date because of cold and rainy weather - she  show 0 degreees of extentoin after heat and AROM - pt had trigger finger for about year prior to surgery - pt not 12 wks s/p yet- pt  to cont scar mobs and tendon glides -  grip increase 10 lbs since last time - to contact me if need to be seen again in month     Occupational performance deficits (Please refer to evaluation for details):  ADL's;IADL's;Work;Play;Leisure    Rehab Potential  Good    Current Impairments/barriers affecting progress:  had trigger finger for more than year- and 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 HTN (hypertension) 06/22/2018  . Trigger finger, right 06/22/2018  . No blood products 05/11/2018  . Low vitamin D level 03/23/2018  . Microcytic hypochromic anemia 03/23/2018  . Anxiety and depression 01/14/2016  . Prediabetes 01/14/2016    Oletta Cohn OTR/L,CLT 08/11/2018, 2:10 PM  [facility] [address] [place], [address] Phone: [phone]   Fax:  [phone]  Name: Kathleen Burke MRN: [id number] Date of Birth: May 05, 1976

## 2018-08-11 NOTE — Patient Instructions (Signed)
Cont with scar massage   and tendon glides - with focus on PIP extention

## 2018-11-10 ENCOUNTER — Encounter: Payer: Self-pay | Admitting: Internal Medicine

## 2018-12-01 ENCOUNTER — Ambulatory Visit (INDEPENDENT_AMBULATORY_CARE_PROVIDER_SITE_OTHER): Payer: BLUE CROSS/BLUE SHIELD

## 2018-12-01 ENCOUNTER — Ambulatory Visit: Payer: BLUE CROSS/BLUE SHIELD | Admitting: Internal Medicine

## 2018-12-01 ENCOUNTER — Encounter: Payer: Self-pay | Admitting: Internal Medicine

## 2018-12-01 VITALS — BP 132/88 | HR 111 | Temp 98.2°F | Ht 66.0 in | Wt 235.6 lb

## 2018-12-01 DIAGNOSIS — M25511 Pain in right shoulder: Secondary | ICD-10-CM

## 2018-12-01 DIAGNOSIS — R000 Tachycardia, unspecified: Secondary | ICD-10-CM | POA: Insufficient documentation

## 2018-12-01 DIAGNOSIS — E876 Hypokalemia: Secondary | ICD-10-CM

## 2018-12-01 DIAGNOSIS — L309 Dermatitis, unspecified: Secondary | ICD-10-CM

## 2018-12-01 DIAGNOSIS — I10 Essential (primary) hypertension: Secondary | ICD-10-CM | POA: Diagnosis not present

## 2018-12-01 DIAGNOSIS — M79601 Pain in right arm: Secondary | ICD-10-CM | POA: Insufficient documentation

## 2018-12-01 DIAGNOSIS — L720 Epidermal cyst: Secondary | ICD-10-CM | POA: Insufficient documentation

## 2018-12-01 MED ORDER — TRIAMCINOLONE ACETONIDE 0.1 % EX CREA: 1.0000 "application " | TOPICAL_CREAM | Freq: Two times a day (BID) | CUTANEOUS | 0 refills | Status: DC

## 2018-12-01 MED ORDER — HYDROCORTISONE 2.5 % EX CREA: TOPICAL_CREAM | Freq: Two times a day (BID) | CUTANEOUS | 0 refills | Status: DC

## 2018-12-01 NOTE — Progress Notes (Signed)
Pre visit review using our clinic review tool, if applicable. No additional management support is needed unless otherwise documented below in the visit note. 

## 2018-12-01 NOTE — Patient Instructions (Addendum)
Dr. [facility]udAmy McMichael [facility] [place] [facility] with hair loss  Dr. Moshe CiproAustin Newsome in [place], [place]     Alopecia Areata, Adult  Alopecia areata is a condition that causes you to lose hair. You may lose hair on your scalp in patches. In some cases, you may lose all the hair on your scalp (alopecia totalis) or all the hair from your face and body (alopecia universalis). Alopecia areata is an autoimmune disease. This means that your body's defense system (immune system) mistakes normal parts of the body for germs or other things that can make you sick. When you have alopecia areata, the immune system attacks the hair follicles. Alopecia areata usually develops in childhood, but it can develop at any age. For some people, their hair grows back on its own and hair loss does not happen again. For others, their hair may fall out and grow back in cycles. The hair loss may last many years. Having this condition can be emotionally difficult, but it is not dangerous. What are the causes? The cause of this condition is not known. What increases the risk? This condition is more likely to develop in people who have:  A family history of alopecia.  A family history of another autoimmune disease, including type 1 diabetes and rheumatoid arthritis.  Asthma and allergies.  Down syndrome. What are the signs or symptoms? Round spots of patchy hair loss on the scalp is the main symptom of this condition. The spots may be mildly itchy. Other symptoms include:  Short dark hairs in the bald patches that are wider at the top (exclamation point hairs).  Dents, white spots, or lines in the fingernails or toenails.  Balding and body hair loss. This is rare. How is this diagnosed? This condition is diagnosed based on your symptoms and family history. Your health care provider will also check your scalp skin, teeth, and nails. Your health care provider may refer you to a specialist in hair and  skin disorders (dermatologist). You may also have tests, including:  A hair pull test.  Blood tests or other screening tests to check for autoimmune diseases, such as thyroid disease or diabetes.  Skin biopsy to confirm the diagnosis.  A procedure to examine the skin with a lighted magnifying instrument (dermoscopy). How is this treated? There is no cure for alopecia areata. Treatment is aimed at promoting the regrowth of hair and preventing the immune system from overreacting. No single treatment is right for all people with alopecia areata. It depends on the type of hair loss you have and how severe it is. Work with your health care provider to find the best treatment for you. Treatment may include:  Having regular checkups to make sure the condition is not getting worse (watchful waiting).  Steroid creams or pills for 6-8 weeks to stop the immune reaction and help hair to regrow more quickly.  Other topical medicines to alter the immune system response and support the hair growth cycle.  Steroid injections.  Therapy and counseling with a support group or therapist if you are having trouble coping with hair loss. Follow these instructions at home:  Learn as much as you can about your condition.  Apply topical creams only as told by your health care provider.  Take over-the-counter and prescription medicines only as told by your health care provider.  Consider getting a wig or products to make hair look fuller or to cover bald spots, if you feel uncomfortable with your appearance.  Get therapy or counseling if you are having a hard time coping with hair loss. Ask your health care provider to recommend a counselor or support group.  Keep all follow-up visits as told by your health care provider. This is important. Contact a health care provider if:  Your hair loss gets worse, even with treatment.  You have new symptoms.  You are struggling emotionally. Summary  Alopecia  areata is an autoimmune condition that makes your body's defense system (immune system) attack the hair follicles. This causes you to lose hair.  Treatments may include regular checkups to make sure that the condition is not getting worse (watchful waiting), medicines, and steroid injections. This information is not intended to replace advice given to you by your health care provider. Make sure you discuss any questions you have with your health care provider. Document Released: 05/17/2004 Document Revised: 10/31/2016 Document Reviewed: 10/31/2016 Elsevier Interactive Patient Education  2019 Elsevier Inc.    Eczema Eczema is a broad term for a group of skin conditions that cause skin to become rough and inflamed. Each type of eczema has different triggers, symptoms, and treatments. Eczema of any type is usually itchy and symptoms range from mild to severe. Eczema and its symptoms are not spread from person to person (are not contagious). It can appear on different parts of the body at different times. Your eczema may not look the same as someone else's eczema. What are the types of eczema? Atopic dermatitis This is a long-term (chronic) skin disease that keeps coming back (recurring). Usual symptoms are dry skin and small, solid pimples that may swell and leak fluid (weep). Contact dermatitis  This happens when something irritates the skin and causes a rash. The irritation can come from substances that you are allergic to (allergens), such as poison ivy, chemicals, or medicines that were applied to your skin. Dyshidrotic eczema This is a form of eczema on the hands and feet. It shows up as very itchy, fluid-filled blisters. It can affect people of any age, but is more common before age 36. Hand eczema  This causes very itchy areas of skin on the palms and sides of the hands and fingers. This type of eczema is common in industrial jobs where you may be exposed to many different types of  irritants. Lichen simplex chronicus This type of eczema occurs when a person constantly scratches one area of the body. Repeated scratching of the area leads to thickened skin (lichenification). Lichen simplex chronicus can occur along with other types of eczema. It is more common in adults, but may be seen in children as well. Nummular eczema This is a common type of eczema. It has no known cause. It typically causes a red, circular, crusty lesion (plaque) that may be itchy. Scratching may become a habit and can cause bleeding. Nummular eczema occurs most often in people of middle-age or older. It most often affects the hands. Seborrheic dermatitis This is a common skin disease that mainly affects the scalp. It may also affect any oily areas of the body, such as the face, sides of nose, eyebrows, ears, eyelids, and chest. It is marked by small scaling and redness of the skin (erythema). This can affect people of all ages. In infants, this condition is known as Location manager." Stasis dermatitis This is a common skin disease that usually appears on the legs and feet. It most often occurs in people who have a condition that prevents blood from being pumped through the  veins in the legs (chronic venous insufficiency). Stasis dermatitis is a chronic condition that needs long-term management. How is eczema diagnosed? Your health care provider will examine your skin and review your medical history. He or she may also give you skin patch tests. These tests involve taking patches that contain possible allergens and placing them on your back. He or she will then check in a few days to see if an allergic reaction occurred. What are the common treatments? Treatment for eczema is based on the type of eczema you have. Hydrocortisone steroid medicine can relieve itching quickly and help reduce inflammation. This medicine may be prescribed or obtained over-the-counter, depending on the strength of the medicine that is  needed. Follow these instructions at home:  Take over-the-counter and prescription medicines only as told by your health care provider.  Use creams or ointments to moisturize your skin. Do not use lotions.  Learn what triggers or irritates your symptoms. Avoid these things.  Treat symptom flare-ups quickly.  Do not itch your skin. This can make your rash worse.  Keep all follow-up visits as told by your health care provider. This is important. Where to find more information  The American Academy of Dermatology: InfoExam.si  The National Eczema Association: www.nationaleczema.org Contact a health care provider if:  You have serious itching, even with treatment.  You regularly scratch your skin until it bleeds.  Your rash looks different than usual.  Your skin is painful, swollen, or more red than usual.  You have a fever. Summary  There are eight general types of eczema. Each type has different triggers.  Eczema of any type causes itching that may range from mild to severe.  Treatment varies based on the type of eczema you have. Hydrocortisone steroid medicine can help with itching and inflammation.  Protecting your skin is the best way to prevent eczema. Use moisturizers and lotions. Avoid triggers and irritants, and treat flare-ups quickly. This information is not intended to replace advice given to you by your health care provider. Make sure you discuss any questions you have with your health care provider. Document Released: 02/26/2017 Document Revised: 02/26/2017 Document Reviewed: 02/26/2017 Elsevier Interactive Patient Education  2019 Elsevier Inc.  Epidermal Cyst  An epidermal cyst is a sac made of skin tissue. The sac contains a substance called keratin. Keratin is a protein that is normally secreted through the hair follicles. When keratin becomes trapped in the top layer of skin (epidermis), it can form an epidermal cyst. Epidermal cysts can be found anywhere  on your body. These cysts are usually harmless (benign), and they may not cause symptoms unless they become infected. What are the causes? This condition may be caused by:  A blocked hair follicle.  A hair that curls and re-enters the skin instead of growing straight out of the skin (ingrown hair).  A blocked pore.  Irritated skin.  An injury to the skin.  Certain conditions that are passed along from parent to child (inherited).  Human papillomavirus (HPV).  Long-term (chronic) sun damage to the skin. What increases the risk? The following factors may make you more likely to develop an epidermal cyst:  Having acne.  Being overweight.  Being 20-82 years old. What are the signs or symptoms? The only symptom of this condition may be a small, painless lump underneath the skin. When an epidermal cyst ruptures, it may become infected. Symptoms may include:  Redness.  Inflammation.  Tenderness.  Warmth.  Fever.  Keratin draining  from the cyst. Keratin is grayish-white, bad-smelling substance.  Pus draining from the cyst. How is this diagnosed? This condition is diagnosed with a physical exam.  In some cases, you may have a sample of tissue (biopsy) taken from your cyst to be examined under a microscope or tested for bacteria.  You may be referred to a health care provider who specializes in skin care (dermatologist). How is this treated? In many cases, epidermal cysts go away on their own without treatment. If a cyst becomes infected, treatment may include:  Opening and draining the cyst, done by a health care provider. After draining, minor surgery to remove the rest of the cyst may be done.  Antibiotic medicine.  Injections of medicines (steroids) that help to reduce inflammation.  Surgery to remove the cyst. Surgery may be done if the cyst: ? Becomes large. ? Bothers you. ? Has a chance of turning into cancer.  Do not try to open a cyst yourself. Follow  these instructions at home:  Take over-the-counter and prescription medicines only as told by your health care provider.  If you were prescribed an antibiotic medicine, take it it as told by your health care provider. Do not stop using the antibiotic even if you start to feel better.  Keep the area around your cyst clean and dry.  Wear loose, dry clothing.  Avoid touching your cyst.  Check your cyst every day for signs of infection. Check for: ? Redness, swelling, or pain. ? Fluid or blood. ? Warmth. ? Pus or a bad smell.  Keep all follow-up visits as told by your health care provider. This is important. How is this prevented?  Wear clean, dry, clothing.  Avoid wearing tight clothing.  Keep your skin clean and dry. Take showers or baths every day. Contact a health care provider if:  Your cyst develops symptoms of infection.  Your condition is not improving or is getting worse.  You develop a cyst that looks different from other cysts you have had.  You have a fever. Get help right away if:  Redness spreads from the cyst into the surrounding area. Summary  An epidermal cyst is a sac made of skin tissue. These cysts are usually harmless (benign), and they may not cause symptoms unless they become infected.  If a cyst becomes infected, treatment may include surgery to open and drain the cyst, or to remove it. Treatment may also include medicines by mouth or through an injection.  Take over-the-counter and prescription medicines only as told by your health care provider. If you were prescribed an antibiotic medicine, take it as told by your health care provider. Do not stop using the antibiotic even if you start to feel better.  Contact a health care provider if your condition is not improving or is getting worse.  Keep all follow-up visits as told by your health care provider. This is important. This information is not intended to replace advice given to you by your  health care provider. Make sure you discuss any questions you have with your health care provider. Document Released: 09/13/2004 Document Revised: 04/26/2018 Document Reviewed: 04/26/2018 Elsevier Interactive Patient Education  2019 [employer]

## 2018-12-01 NOTE — Progress Notes (Signed)
Chief Complaint  Patient presents with  . Follow-up    lesion under left breast changing   F/u  1. Lesion left breast changing dark and tender w/o itching raised and tender no drainage duration x 1 month 2. C/o right shoulder pain achy/nagging when lying down at night if husband squeezes relieves it. No swelling in upper arm but pain from right shoulder down to right upper arm  3. HTN on hctz 25 mg qd with increased HR today and usu. Is elevated HR  4. Low K will repeat again today  5. Eczema to right and left wrists and neck she is using [place] soap/dove at times and tide and wears costume jewelery    Review of Systems  Constitutional: Negative for weight loss.  HENT: Negative for hearing loss.   Eyes: Negative for blurred vision.  Respiratory: Negative for shortness of breath.   Cardiovascular: Negative for chest pain.  Gastrointestinal: Negative for abdominal pain.  Musculoskeletal: Positive for joint pain. Negative for falls.  Skin: Negative for rash.       Left breast lesion    Neurological: Negative for headaches.  Psychiatric/Behavioral: Negative for depression.   Past Medical History:  Diagnosis Date  . Anemia   . Anxiety 2014  . Arthritis   . Chicken pox   . Depression   . Frequent headaches   . GERD (gastroesophageal reflux disease)   . Hypertension   . Obesity (BMI 30-39.9)   . Pre-diabetes   . Trigger finger    MIDDLE FINGER RIGHT HAND SURGERY ON JULY 31ST 2019   Past Surgical History:  Procedure Laterality Date  . HYSTEROSCOPY W/D&C N/A 05/20/2018   Procedure: DILATATION AND CURETTAGE /HYSTEROSCOPY;  Surgeon: [name], MD;  Location: [facility] ORS;  Service: Gynecology;  Laterality: N/A;  . INTRAUTERINE DEVICE (IUD) INSERTION N/A 05/20/2018   Procedure: INTRAUTERINE DEVICE (IUD) INSERTION;  Surgeon: [name], MD;  Location: [facility] ORS;  Service: Gynecology;  Laterality: N/A;  . WISDOM TOOTH EXTRACTION     Family History  Problem Relation  Age of Onset  . Arthritis Mother   . Hyperlipidemia Mother   . Hypertension Mother   . Diabetes Mellitus I Father        DM I  . Breast cancer Maternal Grandmother 78  . Arthritis Paternal Grandmother   . Diabetes Mellitus II Paternal Grandmother   . Arthritis Paternal Grandfather   . Hyperlipidemia Paternal Grandfather   . Stroke Paternal Grandfather   . Hypertension Paternal Grandfather   . Diabetes Mellitus II Sister   . Diabetes Mellitus II Sister   . Hyperlipidemia Sister   . Hypertension Sister    Social History   Socioeconomic History  . Marital status: Married    Spouse name: Not on file  . Number of children: 2  . Years of education: Not on file  . Highest education level: Not on file  Occupational History  . Occupation: Therapist, sports  Social Needs  . Financial resource strain: Not on file  . Food insecurity:    Worry: Not on file    Inability: Not on file  . Transportation needs:    Medical: Not on file    Non-medical: Not on file  Tobacco Use  . Smoking status: Never Smoker  . Smokeless tobacco: Never Used  Substance and Sexual Activity  . Alcohol use: Yes    Alcohol/week: 2.0 standard drinks    Types: 2 Glasses of wine per week    Comment: OCC-WEEKENDS  .  Drug use: No  . Sexual activity: Yes    Partners: Male    Birth control/protection: Surgical    Comment: vasectomy  Lifestyle  . Physical activity:    Days per week: 0 days    Minutes per session: 0 min  . Stress: Not at all  Relationships  . Social connections:    Talks on phone: Not on file    Gets together: Not on file    Attends religious service: Not on file    Active member of club or organization: Not on file    Attends meetings of clubs or organizations: Not on file    Relationship status: Not on file  . Intimate partner violence:    Fear of current or ex partner: Not on file    Emotionally abused: Not on file    Physically abused: Not on file    Forced sexual activity: Not on file  Other  Topics Concern  . Not on file  Social History Narrative   Married    2 kids    Works occup[employer]    Current Meds  Medication Sig  . diazepam (VALIUM) 5 MG tablet 1 tablet once; 1 hour prior to flying.  . docusate sodium (COLACE) 100 MG capsule Take 100 mg by mouth daily.  . ferrous sulfate 325 (65 FE) MG tablet Take 325 mg by mouth daily with breakfast.  . hydrochlorothiazide (HYDRODIURIL) 25 MG tablet 1 tab daily by mouth  . ibuprofen (ADVIL,MOTRIN) 600 MG tablet Take 1 tablet (600 mg total) by mouth every 6 (six) hours as needed.  . Multiple Vitamins-Minerals (MULTIVITAMIN PO) Take 1 packet by mouth See admin instructions. [name] Supplement Multivitamin Packet - Take 6 tablets by mouth in the morning and take 7 tablets by mouth at bedtime  . venlafaxine XR ([name]) 37.5 MG 24 hr capsule Take 1 capsule (37.5 mg total) by mouth daily. (Patient taking differently: Take 37.5 mg by mouth at bedtime. )  . [DISCONTINUED] ranitidine (ZANTAC) 150 MG capsule Take 150 mg by mouth as needed for heartburn.   Allergies  Allergen Reactions  . Latex Itching    GLOVES   No results found for this or any previous visit (from the past 2160 hour(s)). Objective  Body mass index is 38.03 kg/m. Wt Readings from Last 3 Encounters:  12/01/18 235 lb 9.6 oz (106.9 kg)  07/12/18 233 lb (105.7 kg)  06/22/18 238 lb 6.4 oz (108.1 kg)   Temp Readings from Last 3 Encounters:  12/01/18 98.2 F (36.8 C) (Oral)  06/22/18 98.6 F (37 C) (Oral)  05/20/18 97.7 F (36.5 C) (Temporal)   BP Readings from Last 3 Encounters:  12/01/18 132/88  07/12/18 (!) 138/92  06/22/18 122/78   Pulse Readings from Last 3 Encounters:  12/01/18 (!) 111  07/12/18 (!) 103  06/22/18 (!) 102    Physical Exam Vitals signs and nursing note reviewed.  Constitutional:      Appearance: Normal appearance. She is well-developed and well-groomed. She is obese.  HENT:     Head: Normocephalic and atraumatic.     Nose:  Nose normal.     Mouth/Throat:     Mouth: Mucous membranes are moist.     Pharynx: Oropharynx is clear.  Eyes:     Conjunctiva/sclera: Conjunctivae normal.     Pupils: Pupils are equal, round, and reactive to light.  Cardiovascular:     Rate and Rhythm: Regular rhythm. Tachycardia present.     Heart sounds: Normal  heart sounds.  Pulmonary:     Effort: Pulmonary effort is normal.     Breath sounds: Normal breath sounds.  Musculoskeletal:     Right shoulder: She exhibits tenderness. She exhibits normal range of motion.     Right elbow: Tenderness found.       Arms:  Skin:    General: Skin is warm and dry.       Neurological:     General: No focal deficit present.     Mental Status: She is alert and oriented to person, place, and time. Mental status is at baseline.     Gait: Gait normal.  Psychiatric:        Attention and Perception: Attention and perception normal.        Mood and Affect: Mood and affect normal.        Speech: Speech normal.        Behavior: Behavior normal. Behavior is cooperative.        Thought Content: Thought content normal.        Cognition and Memory: Cognition and memory normal.        Judgment: Judgment normal.     Assessment   1. Left breast lesion likely EIC small 0.5 cm refer derm removal  2. Right shoulder pain r/o arthritis/bursitis no swelling no c/w DVT sx's  3. HTN  4. hypoK  5. Sinus tachycardia with h/o anxiety 6. Eczema  7. HM Plan   1. Refer dermatology asap for removal  2. Xray right shoulder today  3. On hctz 25 mg qd  Check cmet today if low K consider triamterine/hctz  Also with sinus tachy consider BB 4. Check cmet and mag today  5. Consider BB in future  6. hc and tmc creams  Mild skin care disc 7.  Will get flu at work  utd tdap due 11/26/2019  Immune hep B and MMR  Pap [facility] 03/23/18 neg no HPV testing done  Endometrial Bx 05/20/18 negative with IUD now  mammo 06/15/18 negative  Declines HIV testing rec take D3  2000 IUD daily  Needs fasting labs at f/u   Daughter has Alopecia areata so disc derm for her as well today recommendations Provider: Dr. Olivia Mackie [name]

## 2018-12-02 ENCOUNTER — Other Ambulatory Visit: Payer: Self-pay | Admitting: Internal Medicine

## 2018-12-02 DIAGNOSIS — M19011 Primary osteoarthritis, right shoulder: Secondary | ICD-10-CM

## 2018-12-02 DIAGNOSIS — R000 Tachycardia, unspecified: Secondary | ICD-10-CM

## 2018-12-02 DIAGNOSIS — I10 Essential (primary) hypertension: Secondary | ICD-10-CM

## 2018-12-02 LAB — COMPREHENSIVE METABOLIC PANEL
ALT: 16 U/L (ref 0–35)
AST: 18 U/L (ref 0–37)
Albumin: 4.6 g/dL (ref 3.5–5.2)
Alkaline Phosphatase: 88 U/L (ref 39–117)
BUN: 12 mg/dL (ref 6–23)
CO2: 31 mEq/L (ref 19–32)
Calcium: 9.5 mg/dL (ref 8.4–10.5)
Chloride: 100 mEq/L (ref 96–112)
Creatinine, Ser: 0.73 mg/dL (ref 0.40–1.20)
GFR: 104.47 mL/min (ref >60.00)
Glucose, Bld: 97 mg/dL (ref 70–99)
Potassium: 3.8 mEq/L (ref 3.5–5.1)
Sodium: 139 mEq/L (ref 135–145)
Total Bilirubin: 0.3 mg/dL (ref 0.2–1.2)
Total Protein: 7.5 g/dL (ref 6.0–8.3)

## 2018-12-02 LAB — MAGNESIUM: Magnesium: 1.9 mg/dL (ref 1.5–2.5)

## 2018-12-02 MED ORDER — DICLOFENAC SODIUM 1 % TD GEL: 2.0000 g | Freq: Four times a day (QID) | TRANSDERMAL | 11 refills | Status: DC

## 2018-12-02 MED ORDER — METOPROLOL SUCCINATE ER 25 MG PO TB24: 25.0000 mg | ORAL_TABLET | Freq: Every day | ORAL | 3 refills | Status: DC

## 2018-12-08 ENCOUNTER — Encounter: Payer: Self-pay | Admitting: Internal Medicine

## 2018-12-23 ENCOUNTER — Ambulatory Visit: Payer: BLUE CROSS/BLUE SHIELD | Admitting: Internal Medicine

## 2018-12-30 ENCOUNTER — Other Ambulatory Visit: Payer: Self-pay | Admitting: Internal Medicine

## 2018-12-30 DIAGNOSIS — F419 Anxiety disorder, unspecified: Secondary | ICD-10-CM

## 2018-12-30 DIAGNOSIS — F329 Major depressive disorder, single episode, unspecified: Secondary | ICD-10-CM

## 2018-12-30 MED ORDER — VENLAFAXINE HCL ER 37.5 MG PO CP24: 37.5000 mg | ORAL_CAPSULE | Freq: Every day | ORAL | 0 refills | Status: DC

## 2018-12-30 NOTE — Telephone Encounter (Signed)
Copied from CRM [id number]. Topic: Quick Communication - Rx Refill/Question >> Dec 30, 2018  1:57 PM Angela Nevin wrote: Medication: venlafaxine XR ([name]) 37.5 MG 24 hr capsule  Patient is requesting refill of this medication   Preferred Pharmacy (with phone number or street name):[id number] [place], [place] - [address]  [phone] (Phone) [phone] (Fax)

## 2018-12-30 NOTE — Telephone Encounter (Signed)
Patient has appointment 03/24/19. Requested Prescriptions  Pending Prescriptions Disp Refills  . venlafaxine XR ([name]) 37.5 MG 24 hr capsule 90 capsule 0    Sig: Take 1 capsule (37.5 mg total) by mouth daily.     Psychiatry: Antidepressants - SNRI - desvenlafaxine & venlafaxine Failed - 12/30/2018  2:09 PM      Failed - LDL in normal range and within 360 days    LDL Cholesterol (Calc)  Date Value Ref Range Status  03/17/2018 121 (H) mg/dL (calc) Final    Comment:    Reference range: <100 . Desirable range <100 mg/dL for primary prevention;   <70 mg/dL for patients with CHD or diabetic patients  with > or = 2 CHD risk factors. Marland Kitchen LDL-C is now calculated using the Martin-Hopkins  calculation, which is a validated novel method providing  better accuracy than the Friedewald equation in the  estimation of LDL-C.  Horald Pollen et al. Lenox Ahr. 3491;791(50): 2061-2068  (http://education.[employer].com/faq/FAQ164)          Failed - Total Cholesterol in normal range and within 360 days    Cholesterol  Date Value Ref Range Status  03/17/2018 202 (H) <200 mg/dL Final         Passed - Triglycerides in normal range and within 360 days    Triglycerides  Date Value Ref Range Status  03/17/2018 53 <150 mg/dL Final         Passed - Completed PHQ-2 or PHQ-9 in the last 360 days.      Passed - Last BP in normal range    BP Readings from Last 1 Encounters:  12/01/18 132/88         Passed - Valid encounter within last 6 months    Recent Outpatient Visits          4 weeks ago Epidermal inclusion cyst    [facility] [place] [name], Pasty Spillers, MD   6 months ago Essential hypertension    [facility] [place] [name], Pasty Spillers, MD   9 months ago Anxiety and depression    [facility] [place] [name], Pasty Spillers, MD   2 years ago Preventative health care   [facility] [place], Verdis Frederickson, [place]      Future Appointments            In 2 months [name], Pasty Spillers, MD [facility], [facility]

## 2019-01-07 ENCOUNTER — Other Ambulatory Visit: Payer: Self-pay | Admitting: Lab

## 2019-01-07 ENCOUNTER — Telehealth: Payer: Self-pay | Admitting: Internal Medicine

## 2019-01-07 DIAGNOSIS — L309 Dermatitis, unspecified: Secondary | ICD-10-CM

## 2019-01-07 DIAGNOSIS — I10 Essential (primary) hypertension: Secondary | ICD-10-CM

## 2019-01-07 MED ORDER — HYDROCHLOROTHIAZIDE 25 MG PO TABS: ORAL_TABLET | ORAL | 1 refills | Status: DC

## 2019-01-07 NOTE — Telephone Encounter (Signed)
Copied from CRM [id number]. Topic: Quick Communication - Rx Refill/Question >> Jan 07, 2019  8:46 AM Jens Som A wrote: Medication: hydrochlorothiazide (HYDRODIURIL) 25 MG tablet [[id number]]   Has the patient contacted their pharmacy? Yes  (Agent: If no, request that the patient contact the pharmacy for the refill.) (Agent: If yes, when and what did the pharmacy advise?)  Preferred Pharmacy (with phone number or street name): [id number] [place] [place] - [address] [phone] (Phone) [phone] (Fax)    Agent: Please be advised that RX refills may take up to 3 business days. We ask that you follow-up with your pharmacy.

## 2019-01-07 NOTE — Telephone Encounter (Signed)
I sent in Rx Refill, and I Will call Pt to tell her.

## 2019-02-14 ENCOUNTER — Telehealth: Payer: Self-pay

## 2019-02-14 NOTE — Telephone Encounter (Signed)
Called and scheduled pt for a telephone visit for tomorrow morning (02/15/19) @ 8:30 am.  Pt is unable to do DOXY Me appt since the camera on her smart phone is not working.

## 2019-02-14 NOTE — Telephone Encounter (Signed)
Copied from CRM [id number]. Topic: Appointment Scheduling - Scheduling Inquiry for Clinic >> Feb 14, 2019 10:30 AM Marylen Ponto wrote: Reason for CRM: Pt stated she had been taking an antibiotic from her dentist and now she has developed a yeast infection. Attempted to transfer pt to the office but there was no answer. Pt requests a call back. Cb# [phone]

## 2019-02-15 ENCOUNTER — Ambulatory Visit (INDEPENDENT_AMBULATORY_CARE_PROVIDER_SITE_OTHER): Payer: BLUE CROSS/BLUE SHIELD | Admitting: Internal Medicine

## 2019-02-15 DIAGNOSIS — B379 Candidiasis, unspecified: Secondary | ICD-10-CM | POA: Diagnosis not present

## 2019-02-15 MED ORDER — FLUCONAZOLE 150 MG PO TABS
150.0000 mg | ORAL_TABLET | Freq: Once | ORAL | 0 refills | Status: AC
Start: 2019-02-15 — End: 2019-02-15

## 2019-02-15 NOTE — Progress Notes (Signed)
Telephone Note  I connected with Kathleen Burke   on 02/15/19 at  8:43 AM EDT by telephone and verified that I am speaking with the correct person using two identifiers.  Location patient: home Location provider:work  Persons participating in the virtual visit: patient, provider  I discussed the limitations of evaluation and management by telemedicine and the availability of in person appointments. The patient expressed understanding and agreed to proceed.   HPI: Pt reports had root canal last Tuesday and had yeast infection sx's with itchy vaginal discharge on Amoxil 500 mg qid  x1 week called dentist but would not Rx diflucan   Stable left breast lesion appt dermatology 03/24/2019   Arthritis right shoulder noted on Xray   BP 120/80 on hctz 25 mg qd HR has been 83   Hand eczema cleared up since not washing hands as much   ROS: See pertinent positives and negatives per HPI.  Past Medical History:  Diagnosis Date  . Anemia   . Anxiety 2014  . Arthritis   . Chicken pox   . Depression   . Frequent headaches   . GERD (gastroesophageal reflux disease)   . Hypertension   . Obesity (BMI 30-39.9)   . Pre-diabetes   . Trigger finger    MIDDLE FINGER RIGHT HAND SURGERY ON JULY 31ST 2019    Past Surgical History:  Procedure Laterality Date  . HYSTEROSCOPY W/D&C N/A 05/20/2018   Procedure: DILATATION AND CURETTAGE /HYSTEROSCOPY;  Surgeon: [name], MD;  Location: [facility];  Service: Gynecology;  Laterality: N/A;  . INTRAUTERINE DEVICE (IUD) INSERTION N/A 05/20/2018   Procedure: INTRAUTERINE DEVICE (IUD) INSERTION;  Surgeon: [name], MD;  Location: [facility];  Service: Gynecology;  Laterality: N/A;  . WISDOM TOOTH EXTRACTION      Family History  Problem Relation Age of Onset  . Arthritis Mother   . Hyperlipidemia Mother   . Hypertension Mother   . Diabetes Mellitus I Father        DM I  . Breast cancer Maternal Grandmother 53  . Arthritis Paternal Grandmother    . Diabetes Mellitus II Paternal Grandmother   . Arthritis Paternal Grandfather   . Hyperlipidemia Paternal Grandfather   . Stroke Paternal Grandfather   . Hypertension Paternal Grandfather   . Diabetes Mellitus II Sister   . Diabetes Mellitus II Sister   . Hyperlipidemia Sister   . Hypertension Sister     SOCIAL HX: married with kids    Current Outpatient Medications:  .  diazepam (VALIUM) 5 MG tablet, 1 tablet once; 1 hour prior to flying., Disp: 5 tablet, Rfl: 0 .  diclofenac sodium (VOLTAREN) 1 % GEL, Apply 2 g topically 4 (four) times daily., Disp: 100 g, Rfl: 11 .  docusate sodium (COLACE) 100 MG capsule, Take 100 mg by mouth daily., Disp: , Rfl:  .  ferrous sulfate 325 (65 FE) MG tablet, Take 325 mg by mouth daily with breakfast., Disp: , Rfl:  .  fluconazole (DIFLUCAN) 150 MG tablet, Take 1 tablet (150 mg total) by mouth once for 1 dose. Repeat in 3 days, Disp: 2 tablet, Rfl: 0 .  hydrochlorothiazide (HYDRODIURIL) 25 MG tablet, 1 tab daily by mouth, Disp: 90 tablet, Rfl: 1 .  hydrocortisone 2.5 % cream, Apply topically 2 (two) times daily. As needed neck, face, Disp: 60 g, Rfl: 0 .  ibuprofen (ADVIL,MOTRIN) 600 MG tablet, Take 1 tablet (600 mg total) by mouth every 6 (six) hours as needed., Disp: 60 tablet,  Rfl: 3 .  metoprolol succinate (TOPROL-XL) 25 MG 24 hr tablet, Take 1 tablet (25 mg total) by mouth daily., Disp: 90 tablet, Rfl: 3 .  Multiple Vitamins-Minerals (MULTIVITAMIN PO), Take 1 packet by mouth See admin instructions. [name] Supplement Multivitamin Packet - Take 6 tablets by mouth in the morning and take 7 tablets by mouth at bedtime, Disp: , Rfl:  .  triamcinolone cream (KENALOG) 0.1 %, Apply 1 application topically 2 (two) times daily., Disp: 80 g, Rfl: 0 .  venlafaxine XR ([name]) 37.5 MG 24 hr capsule, Take 1 capsule (37.5 mg total) by mouth daily., Disp: 90 capsule, Rfl: 0  EXAM:  VITALS per patient if applicable:  GENERAL: alert, oriented, appears  well and in no acute distress  MS: moves all visible extremities without noticeable abnormality  PSYCH/NEURO: pleasant and cooperative, no obvious depression or anxiety, speech and thought processing grossly intact  ASSESSMENT AND PLAN:  Discussed the following assessment and plan:  Yeast infection - Plan: fluconazole (DIFLUCAN) 150 MG tablet x 2 doses repeat 2nd dose in 3 days call back if not better   Stable left breast lesion appt dermatology 03/24/2019   Arthritis right shoulder noted on Xray   BP 120/80 on hctz 25 mg qd HR has been 83   Hand eczema cleared up since not washing hands as much  Has HC for face/neck and [facility] for body   HM Will get flu at work utd tdapdue 11/26/2019  Immune hep B and MMR Pap [facility] 03/23/18 neg no HPV testing done  Endometrial Bx 05/20/18 negative with IUD now mammo8/20/19 negative Declines HIV testing rec take D3 2000 IUD daily Needs fasting labs at f/u    I discussed the assessment and treatment plan with the patient. The patient was provided an opportunity to ask questions and all were answered. The patient agreed with the plan and demonstrated an understanding of the instructions.   The patient was advised to call back or seek an in-person evaluation if the symptoms worsen or if the condition fails to improve as anticipated.  Time spent 15 minutes  Delorise Jackson, MD

## 2019-03-24 ENCOUNTER — Encounter: Payer: Self-pay | Admitting: Internal Medicine

## 2019-03-24 ENCOUNTER — Ambulatory Visit (INDEPENDENT_AMBULATORY_CARE_PROVIDER_SITE_OTHER): Payer: BLUE CROSS/BLUE SHIELD | Admitting: Internal Medicine

## 2019-03-24 ENCOUNTER — Other Ambulatory Visit: Payer: Self-pay

## 2019-03-24 DIAGNOSIS — Z1231 Encounter for screening mammogram for malignant neoplasm of breast: Secondary | ICD-10-CM

## 2019-03-24 DIAGNOSIS — F419 Anxiety disorder, unspecified: Secondary | ICD-10-CM

## 2019-03-24 DIAGNOSIS — Z1329 Encounter for screening for other suspected endocrine disorder: Secondary | ICD-10-CM

## 2019-03-24 DIAGNOSIS — E559 Vitamin D deficiency, unspecified: Secondary | ICD-10-CM

## 2019-03-24 DIAGNOSIS — I10 Essential (primary) hypertension: Secondary | ICD-10-CM | POA: Diagnosis not present

## 2019-03-24 DIAGNOSIS — Z0000 Encounter for general adult medical examination without abnormal findings: Secondary | ICD-10-CM | POA: Diagnosis not present

## 2019-03-24 DIAGNOSIS — R7303 Prediabetes: Secondary | ICD-10-CM

## 2019-03-24 DIAGNOSIS — R0683 Snoring: Secondary | ICD-10-CM

## 2019-03-24 DIAGNOSIS — L309 Dermatitis, unspecified: Secondary | ICD-10-CM

## 2019-03-24 DIAGNOSIS — F329 Major depressive disorder, single episode, unspecified: Secondary | ICD-10-CM

## 2019-03-24 MED ORDER — HYDROCHLOROTHIAZIDE 25 MG PO TABS: ORAL_TABLET | ORAL | 3 refills | Status: DC

## 2019-03-24 MED ORDER — VENLAFAXINE HCL ER 37.5 MG PO CP24: 37.5000 mg | ORAL_CAPSULE | Freq: Every day | ORAL | 3 refills | Status: DC

## 2019-03-24 NOTE — Progress Notes (Signed)
Telephone Note  I connected with Kathleen Burke   on 03/24/19 at  8:31 AM EDT by telephone and verified that I am speaking with the correct person using two identifiers.  Location patient: home Location provider:work  Persons participating in the virtual visit: patient, provider  I discussed the limitations of evaluation and management by telemedicine and the availability of in person appointments. The patient expressed understanding and agreed to proceed.   HPI: 1. HTN has not had meds yet today hctz 25 nor toprol 25 mg BP 163/114 prior to this with meds was 122/87 she will take meds and call back with reading today  2. Snoring with apnea and c/w sleep apnea refer pulmonary home sleep study  3. Annual   ROS: See pertinent positives and negatives per HPI. General: denies fever  HEENT: no sore throat  CV: no chest pain HR better  Lungs: no sob MSK: no shoulder pain resolved voltaren gel help  Ab: no abdominal pain Neuro/psych: anxiety controlled, no dizziness or h/a GU: no issues   Past Medical History:  Diagnosis Date  . Anemia   . Anxiety 2014  . Arthritis   . Chicken pox   . Depression   . Frequent headaches   . GERD (gastroesophageal reflux disease)   . Hypertension   . Obesity (BMI 30-39.9)   . Pre-diabetes   . Trigger finger    MIDDLE FINGER RIGHT HAND SURGERY ON JULY 31ST 2019    Past Surgical History:  Procedure Laterality Date  . HYSTEROSCOPY W/D&C N/A 05/20/2018   Procedure: DILATATION AND CURETTAGE /HYSTEROSCOPY;  Surgeon: [name], MD;  Location: [facility];  Service: Gynecology;  Laterality: N/A;  . INTRAUTERINE DEVICE (IUD) INSERTION N/A 05/20/2018   Procedure: INTRAUTERINE DEVICE (IUD) INSERTION;  Surgeon: [name], MD;  Location: [facility];  Service: Gynecology;  Laterality: N/A;  . WISDOM TOOTH EXTRACTION      Family History  Problem Relation Age of Onset  . Arthritis Mother   . Hyperlipidemia Mother   . Hypertension Mother   .  Diabetes Mellitus I Father        DM I  . Breast cancer Maternal Grandmother 5  . Arthritis Paternal Grandmother   . Diabetes Mellitus II Paternal Grandmother   . Arthritis Paternal Grandfather   . Hyperlipidemia Paternal Grandfather   . Stroke Paternal Grandfather   . Hypertension Paternal Grandfather   . Diabetes Mellitus II Sister   . Diabetes Mellitus II Sister   . Hyperlipidemia Sister   . Hypertension Sister     SOCIAL HX: married  2 kids 43 y.o son and daughter   Current Outpatient Medications:  .  diclofenac sodium (VOLTAREN) 1 % GEL, Apply 2 g topically 4 (four) times daily., Disp: 100 g, Rfl: 11 .  docusate sodium (COLACE) 100 MG capsule, Take 100 mg by mouth daily., Disp: , Rfl:  .  ferrous sulfate 325 (65 FE) MG tablet, Take 325 mg by mouth daily with breakfast., Disp: , Rfl:  .  hydrochlorothiazide (HYDRODIURIL) 25 MG tablet, 1 tab daily by mouth, Disp: 90 tablet, Rfl: 3 .  hydrocortisone 2.5 % cream, Apply topically 2 (two) times daily. As needed neck, face, Disp: 60 g, Rfl: 0 .  ibuprofen (ADVIL,MOTRIN) 600 MG tablet, Take 1 tablet (600 mg total) by mouth every 6 (six) hours as needed., Disp: 60 tablet, Rfl: 3 .  metoprolol succinate (TOPROL-XL) 25 MG 24 hr tablet, Take 1 tablet (25 mg total) by mouth daily., Disp: 90  tablet, Rfl: 3 .  Multiple Vitamins-Minerals (MULTIVITAMIN PO), Take 1 packet by mouth See admin instructions. [name] Supplement Multivitamin Packet - Take 6 tablets by mouth in the morning and take 7 tablets by mouth at bedtime, Disp: , Rfl:  .  triamcinolone cream (KENALOG) 0.1 %, Apply 1 application topically 2 (two) times daily., Disp: 80 g, Rfl: 0 .  venlafaxine XR ([name]) 37.5 MG 24 hr capsule, Take 1 capsule (37.5 mg total) by mouth daily., Disp: 90 capsule, Rfl: 3  EXAM:  VITALS per patient if applicable:  GENERAL: alert, oriented, appears well and in no acute distress  PSYCH/NEURO: pleasant and cooperative, no obvious depression or  anxiety, speech and thought processing grossly intact  ASSESSMENT AND PLAN:  Discussed the following assessment and plan:  Essential hypertension - Plan: hydrochlorothiazide (HYDRODIURIL) 25 MG tablet, Comprehensive metabolic panel, CBC w/Diff, Urinalysis, Routine w reflex microscopic, Lipid panel -she has not had meds today will call back with BP and HR reading today   Anxiety and depression - Plan: venlafaxine XR ([name]) 37.5 MG 24 hr capsule  Eczema, unspecified type-derm appt today with Dr. Kellie Moor at 2:15 pm   Prediabetes - Plan: Hemoglobin A1c  Snoring - Plan: Ambulatory referral to Pulmonology home sleep study   HM/annual  Will get flu at work utd tdapdue 11/26/2019 Immune hep B and MMR  Pap [facility] 03/23/18 neg no HPV testing done Endometrial Bx 05/20/18 negative with IUD now  mammo8/20/19 negativeordered for next yeat   Declines HIV testing  rec take D3 2000 IUD daily  Needs fasting labs sch 6/12 9:15 am   I discussed the assessment and treatment plan with the patient. The patient was provided an opportunity to ask questions and all were answered. The patient agreed with the plan and demonstrated an understanding of the instructions.   The patient was advised to call back or seek an in-person evaluation if the symptoms worsen or if the condition fails to improve as anticipated.  Time spent 15 minutes  Delorise Jackson, MD

## 2019-04-05 ENCOUNTER — Ambulatory Visit (INDEPENDENT_AMBULATORY_CARE_PROVIDER_SITE_OTHER): Payer: BC Managed Care – PPO | Admitting: Internal Medicine

## 2019-04-05 DIAGNOSIS — G4719 Other hypersomnia: Secondary | ICD-10-CM

## 2019-04-05 NOTE — Patient Instructions (Signed)

## 2019-04-05 NOTE — Progress Notes (Signed)
[facility]     Virtual Visit via Telephone Note I connected with patient on 04/05/19 at  4:00 PM EDT by telephone and verified that I am speaking with the correct person using two identifiers. This video visit was converted to a telephone visit due to technical issues.   I discussed the limitations, risks, security and privacy concerns of performing an evaluation and management service by telephone and the availability of in person appointments. I also discussed with the patient that there may be a patient responsible charge related to this service. The patient expressed understanding and agreed to proceed. I discussed the assessment and treatment plan with the patient. The patient was provided an opportunity to ask questions and all were answered. The patient agreed with the plan and demonstrated an understanding of the instructions. Please see note below for further detail.    The patient was advised to call back or seek an in-person evaluation if the symptoms worsen or if the condition fails to improve as anticipated.  Shane CrutchPradeep Perian Tedder, MD   Assessment and Plan:  Excessive daytime sleepiness. --Symptoms and signs of obstructive sleep apnea. - We will send for sleep study.  Essential Hypertension.  - Sleep apnea can contribute to above, therefore treatment of sleep apnea is important part of management.  Orders Placed This Encounter  Procedures  . Home sleep test   Return in about 3 months (around 07/06/2019).   Date: 04/05/2019  MRN# [id number] Kathleen KluverDenise J Burke 08/23/1976  Referring Physician: Dr. Judie GrieveMcLean-Scocuzza for snoring.   Kathleen KluverDenise J Burke is a 43 y.o. old female seen in consultation for chief complaint of:     HPI:  She has been having daytime sleepiness, snoring, possible witnessed apneas. She notes that she is still tired when waking. Goes to bed at varied times due to COVID, but will be switching back to normal hours soon.  Denies sleep  walking currenlty but did as a child, does have occasional sleep paralysis, denies cataplexy, denies jaw pain, denies TMJ, no dentures.   Has am headaches. Has gained 40 lbs in last few years. She has htn.    PMHX:   Past Medical History:  Diagnosis Date  . Anemia   . Anxiety 2014  . Arthritis   . Chicken pox   . Depression   . Frequent headaches   . GERD (gastroesophageal reflux disease)   . Hypertension   . Obesity (BMI 30-39.9)   . Pre-diabetes   . Trigger finger    MIDDLE FINGER RIGHT HAND SURGERY ON JULY 31ST 2019   Surgical Hx:  Past Surgical History:  Procedure Laterality Date  . HYSTEROSCOPY W/D&C N/A 05/20/2018   Procedure: DILATATION AND CURETTAGE /HYSTEROSCOPY;  Surgeon: Vena AustriaStaebler, Andreas, MD;  Location: [facility];  Service: Gynecology;  Laterality: N/A;  . INTRAUTERINE DEVICE (IUD) INSERTION N/A 05/20/2018   Procedure: INTRAUTERINE DEVICE (IUD) INSERTION;  Surgeon: Vena AustriaStaebler, Andreas, MD;  Location: [facility];  Service: Gynecology;  Laterality: N/A;  . WISDOM TOOTH EXTRACTION     Family Hx:  Family History  Problem Relation Age of Onset  . Arthritis Mother   . Hyperlipidemia Mother   . Hypertension Mother   . Diabetes Mellitus I Father        DM I  . Breast cancer Maternal Grandmother 50  . Arthritis Paternal Grandmother   . Diabetes Mellitus II Paternal Grandmother   . Arthritis Paternal Grandfather   . Hyperlipidemia Paternal Grandfather   . Stroke Paternal Grandfather   .  Hypertension Paternal Grandfather   . Diabetes Mellitus II Sister   . Diabetes Mellitus II Sister   . Hyperlipidemia Sister   . Hypertension Sister    Social Hx:   Social History   Tobacco Use  . Smoking status: Never Smoker  . Smokeless tobacco: Never Used  Substance Use Topics  . Alcohol use: Yes    Alcohol/week: 2.0 standard drinks    Types: 2 Glasses of wine per week    Comment: OCC-WEEKENDS  . Drug use: No   Medication:    Current Outpatient Medications:  .  diclofenac  sodium (VOLTAREN) 1 % GEL, Apply 2 g topically 4 (four) times daily., Disp: 100 g, Rfl: 11 .  docusate sodium (COLACE) 100 MG capsule, Take 100 mg by mouth daily., Disp: , Rfl:  .  ferrous sulfate 325 (65 FE) MG tablet, Take 325 mg by mouth daily with breakfast., Disp: , Rfl:  .  hydrochlorothiazide (HYDRODIURIL) 25 MG tablet, 1 tab daily by mouth, Disp: 90 tablet, Rfl: 3 .  hydrocortisone 2.5 % cream, Apply topically 2 (two) times daily. As needed neck, face, Disp: 60 g, Rfl: 0 .  ibuprofen (ADVIL,MOTRIN) 600 MG tablet, Take 1 tablet (600 mg total) by mouth every 6 (six) hours as needed., Disp: 60 tablet, Rfl: 3 .  metoprolol succinate (TOPROL-XL) 25 MG 24 hr tablet, Take 1 tablet (25 mg total) by mouth daily., Disp: 90 tablet, Rfl: 3 .  Multiple Vitamins-Minerals (MULTIVITAMIN PO), Take 1 packet by mouth See admin instructions. [name] Supplement Multivitamin Packet - Take 6 tablets by mouth in the morning and take 7 tablets by mouth at bedtime, Disp: , Rfl:  .  triamcinolone cream (KENALOG) 0.1 %, Apply 1 application topically 2 (two) times daily., Disp: 80 g, Rfl: 0 .  venlafaxine XR ([name]) 37.5 MG 24 hr capsule, Take 1 capsule (37.5 mg total) by mouth daily., Disp: 90 capsule, Rfl: 3   Allergies:  Latex  Review of Systems: Gen:  Denies  fever, sweats, chills HEENT: Denies blurred vision, double vision. bleeds, sore throat Cvc:  No dizziness, chest pain. Resp:   Denies cough or sputum production, shortness of breath Gi: Denies swallowing difficulty, stomach pain. Gu:  Denies bladder incontinence, burning urine Ext:   No Joint pain, stiffness. Skin: No skin rash,  hives  Endoc:  No polyuria, polydipsia. Psych: No depression, insomnia. Other:  All other systems were reviewed with the patient and were negative other that what is mentioned in the HPI.      LABORATORY PANEL:   CBC No results for input(s): WBC, HGB, HCT, PLT in the last 168 hours.  ------------------------------------------------------------------------------------------------------------------  Chemistries  No results for input(s): NA, K, CL, CO2, GLUCOSE, BUN, CREATININE, CALCIUM, MG, AST, ALT, ALKPHOS, BILITOT in the last 168 hours.  Invalid input(s): GFRCGP ------------------------------------------------------------------------------------------------------------------  Cardiac Enzymes No results for input(s): TROPONINI in the last 168 hours. ------------------------------------------------------------  RADIOLOGY:  No results found.     Thank  you for the consultation and for allowing [facility] to assist in the care of your patient. Our recommendations are noted above.  Please contact us if we can be of further service.   Marda Stalker, M.D., F.C.C.P.  Board Certified in Internal Medicine, Pulmonary Medicine, [place], and Sleep Medicine.  [facility] Office Number: [phone]   04/05/2019

## 2019-04-08 ENCOUNTER — Other Ambulatory Visit: Payer: Self-pay

## 2019-04-08 ENCOUNTER — Other Ambulatory Visit (INDEPENDENT_AMBULATORY_CARE_PROVIDER_SITE_OTHER): Payer: BC Managed Care – PPO

## 2019-04-08 DIAGNOSIS — E559 Vitamin D deficiency, unspecified: Secondary | ICD-10-CM | POA: Diagnosis not present

## 2019-04-08 DIAGNOSIS — Z1329 Encounter for screening for other suspected endocrine disorder: Secondary | ICD-10-CM | POA: Diagnosis not present

## 2019-04-08 DIAGNOSIS — I10 Essential (primary) hypertension: Secondary | ICD-10-CM | POA: Diagnosis not present

## 2019-04-08 DIAGNOSIS — R7303 Prediabetes: Secondary | ICD-10-CM | POA: Diagnosis not present

## 2019-04-08 LAB — COMPREHENSIVE METABOLIC PANEL
ALT: 25 U/L (ref 0–35)
AST: 21 U/L (ref 0–37)
Albumin: 4.3 g/dL (ref 3.5–5.2)
Alkaline Phosphatase: 74 U/L (ref 39–117)
BUN: 12 mg/dL (ref 6–23)
CO2: 27 mEq/L (ref 19–32)
Calcium: 9 mg/dL (ref 8.4–10.5)
Chloride: 101 mEq/L (ref 96–112)
Creatinine, Ser: 0.72 mg/dL (ref 0.40–1.20)
GFR: 105.98 mL/min (ref >60.00)
Glucose, Bld: 88 mg/dL (ref 70–99)
Potassium: 3.8 mEq/L (ref 3.5–5.1)
Sodium: 138 mEq/L (ref 135–145)
Total Bilirubin: 0.4 mg/dL (ref 0.2–1.2)
Total Protein: 6.8 g/dL (ref 6.0–8.3)

## 2019-04-08 LAB — VITAMIN D 25 HYDROXY (VIT D DEFICIENCY, FRACTURES): VITD: 31.31 ng/mL (ref 30.00–100.00)

## 2019-04-08 LAB — CBC WITH DIFFERENTIAL/PLATELET
Basophils Absolute: 0 10*3/uL (ref 0.0–0.1)
Basophils Relative: 0.6 % (ref 0.0–3.0)
Eosinophils Absolute: 0.1 10*3/uL (ref 0.0–0.7)
Eosinophils Relative: 2.4 % (ref 0.0–5.0)
HCT: 35 % — ABNORMAL LOW (ref 36.0–46.0)
Hemoglobin: 11.7 g/dL — ABNORMAL LOW (ref 12.0–15.0)
Lymphocytes Relative: 32.3 % (ref 12.0–46.0)
Lymphs Abs: 1.9 10*3/uL (ref 0.7–4.0)
MCHC: 33.3 g/dL (ref 30.0–36.0)
MCV: 87.8 fl (ref 78.0–100.0)
Monocytes Absolute: 0.4 10*3/uL (ref 0.1–1.0)
Monocytes Relative: 6.8 % (ref 3.0–12.0)
Neutro Abs: 3.4 10*3/uL (ref 1.4–7.7)
Neutrophils Relative %: 57.9 % (ref 43.0–77.0)
Platelets: 322 10*3/uL (ref 150.0–400.0)
RBC: 3.99 Mil/uL (ref 3.87–5.11)
RDW: 14.5 % (ref 11.5–15.5)
WBC: 5.9 10*3/uL (ref 4.0–10.5)

## 2019-04-08 LAB — LIPID PANEL
Cholesterol: 185 mg/dL (ref 0–200)
HDL: 63.2 mg/dL (ref >39.00)
LDL Cholesterol: 112 mg/dL — ABNORMAL HIGH (ref 0–99)
NonHDL: 121.73
Total CHOL/HDL Ratio: 3
Triglycerides: 50 mg/dL (ref 0.0–149.0)
VLDL: 10 mg/dL (ref 0.0–40.0)

## 2019-04-08 LAB — HEMOGLOBIN A1C: Hgb A1c MFr Bld: 6.5 % (ref 4.6–6.5)

## 2019-04-08 LAB — TSH: TSH: 1.21 u[IU]/mL (ref 0.35–4.50)

## 2019-04-09 LAB — URINALYSIS, ROUTINE W REFLEX MICROSCOPIC
Bilirubin Urine: NEGATIVE
Glucose, UA: NEGATIVE
Hgb urine dipstick: NEGATIVE
Ketones, ur: NEGATIVE
Leukocytes,Ua: NEGATIVE
Nitrite: NEGATIVE
Protein, ur: NEGATIVE
Specific Gravity, Urine: 1.008 (ref 1.001–1.03)
pH: 6 (ref 5.0–8.0)

## 2019-04-11 ENCOUNTER — Encounter: Payer: Self-pay | Admitting: Internal Medicine

## 2019-04-11 ENCOUNTER — Ambulatory Visit: Payer: BC Managed Care – PPO

## 2019-04-11 DIAGNOSIS — G4733 Obstructive sleep apnea (adult) (pediatric): Secondary | ICD-10-CM | POA: Diagnosis not present

## 2019-04-11 DIAGNOSIS — G4719 Other hypersomnia: Secondary | ICD-10-CM

## 2019-04-12 ENCOUNTER — Other Ambulatory Visit: Payer: Self-pay | Admitting: Internal Medicine

## 2019-04-12 DIAGNOSIS — E119 Type 2 diabetes mellitus without complications: Secondary | ICD-10-CM

## 2019-04-15 ENCOUNTER — Telehealth: Payer: Self-pay | Admitting: Internal Medicine

## 2019-04-15 DIAGNOSIS — G4733 Obstructive sleep apnea (adult) (pediatric): Secondary | ICD-10-CM

## 2019-04-15 NOTE — Telephone Encounter (Signed)
ONO performed on 04/11/2019 confirmed moderate OSA with AHI of 16. Recommend auto cpap 5-20cm h2O. Pt is aware of results and wished to proceed with cpap.  Order has been placed.  Pt has been scheduled for OV on 06/17/2019. Nothing further is needed.

## 2019-04-19 ENCOUNTER — Encounter: Payer: Self-pay | Admitting: Internal Medicine

## 2019-04-21 ENCOUNTER — Other Ambulatory Visit: Payer: Self-pay | Admitting: Internal Medicine

## 2019-04-21 DIAGNOSIS — E119 Type 2 diabetes mellitus without complications: Secondary | ICD-10-CM

## 2019-04-21 MED ORDER — BLOOD GLUCOSE METER KIT: PACK | 0 refills | Status: DC

## 2019-05-03 ENCOUNTER — Telehealth: Payer: Self-pay | Admitting: Internal Medicine

## 2019-05-03 ENCOUNTER — Encounter: Payer: Self-pay | Admitting: Dietician

## 2019-05-03 ENCOUNTER — Encounter: Payer: BC Managed Care – PPO | Attending: Internal Medicine | Admitting: Dietician

## 2019-05-03 ENCOUNTER — Other Ambulatory Visit: Payer: Self-pay

## 2019-05-03 VITALS — Ht 66.0 in | Wt 240.9 lb

## 2019-05-03 DIAGNOSIS — E119 Type 2 diabetes mellitus without complications: Secondary | ICD-10-CM

## 2019-05-03 MED ORDER — BLOOD GLUCOSE METER KIT: PACK | 0 refills | Status: DC

## 2019-05-03 NOTE — Telephone Encounter (Signed)
Pt stated that pharmacy did not have rx for her blood glucose meter kit and supplies  Requesting it be resent. Please advise.  [facility] [place] (Phone) [phone] (Fax)

## 2019-05-03 NOTE — Progress Notes (Signed)
Medical Nutrition Therapy: Visit start time: 0900  end time: 1000  Assessment:  Diagnosis: Type 2 diabetes Past medical history: HTN, HLD, sleep apnea Psychosocial issues/ stress concerns: none  Preferred learning method:  . Auditory . Visual . Hands-on  Current weight: 240.9lbs Height: 5'6" Medications, supplements: reconciled list in medical record  Progress and evaluation:   Recently progressed from pre-diabetes x several years to Type 2 diabetes.    Patient has been making dietary and lifestyle changes to improve BGs and overall health, and seeks help with appropriate balance of nutrients in meals and snacks.   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   Physical activity: walking 30-45 minutes 5x a week  Dietary Intake:  Usual eating pattern includes 3 meals and 2 snacks per day. Dining out frequency: 3-5 meals per week.  Breakfast: herbalife shake Snack: cheese and crackers or [place] yogurt with granola Lunch: salad with 1oz protein, 2 boiled eggs, dressing, few croutons and pasta in the past week Snack: cheese and crackers, greek yogurt with granola Supper: 7/6 2 tacos; protein + veg ie corn, kale Snack: none Beverages: water, sweet tea, crystal light (not enough fluid daily per patient)  Nutrition Care Education: Topics covered: diabetes Basic nutrition: basic food groups, appropriate nutrient balance, appropriate meal and snack schedule, general nutrition guidelines    Diabetes:  goals for BGs, appropriate meal and snack schedule, appropriate carb intake and balance, healthy carb choices, role of protein and fat, appropriate food portions; basic meal planning using plate method and food models, and tools for recording and/or planning meals.   Nutritional Diagnosis:  [place]-2.2 Altered nutrition-related laboratory As related to Type 2 diabetes.  As evidenced by patient with recent HbA1C of 6.5%. [place]-3.3 Overweight/obesity As  related to history of excess calories.  As evidenced by patient with current BMI of 38.6, making diet changes to reduce caloric intake.  Intervention:   Instruction and discussion as noted above.  Commended patient for changes she has made thus far.   Established goals for further improvement in nutrient balance.   No follow-up needed at this time; encouraged patient to call with any questions or concerns.  Education Materials given:  . General diet guidelines for Diabetes . Plate Planner with food lists . Sample meal pattern/ menus . Daily food record sheets . Goals/ instructions   Learner/ who was taught:  . Patient   Level of understanding: Marland Kitchen Verbalizes/ demonstrates competency   Demonstrated degree of understanding via:   Teach back Learning barriers: . None  Willingness to learn/ readiness for change: . Eager, change in progress   Monitoring and Evaluation:  Dietary intake, exercise, BG control, and body weight      follow up: prn

## 2019-05-03 NOTE — Patient Instructions (Signed)
   Great job making healthy food choices, keep it up!  Plan ahead for healthy balance of food groups in meals; use menus and food record sheets to help with planning. Remember to include protein + controlled amount of carbs + plenty of low-carb veggies.   Continue with regular exercise, which also significantly helps with BG control.

## 2019-06-16 ENCOUNTER — Telehealth: Payer: Self-pay | Admitting: Internal Medicine

## 2019-06-16 ENCOUNTER — Encounter: Payer: Self-pay | Admitting: Internal Medicine

## 2019-06-16 ENCOUNTER — Other Ambulatory Visit: Payer: Self-pay | Admitting: Internal Medicine

## 2019-06-16 ENCOUNTER — Other Ambulatory Visit: Payer: Self-pay

## 2019-06-16 ENCOUNTER — Telehealth (INDEPENDENT_AMBULATORY_CARE_PROVIDER_SITE_OTHER): Payer: BC Managed Care – PPO | Admitting: Internal Medicine

## 2019-06-16 VITALS — Ht 66.0 in | Wt 241.0 lb

## 2019-06-16 DIAGNOSIS — I10 Essential (primary) hypertension: Secondary | ICD-10-CM | POA: Diagnosis not present

## 2019-06-16 DIAGNOSIS — E538 Deficiency of other specified B group vitamins: Secondary | ICD-10-CM | POA: Diagnosis not present

## 2019-06-16 DIAGNOSIS — R202 Paresthesia of skin: Secondary | ICD-10-CM

## 2019-06-16 DIAGNOSIS — R200 Anesthesia of skin: Secondary | ICD-10-CM

## 2019-06-16 DIAGNOSIS — R519 Headache, unspecified: Secondary | ICD-10-CM

## 2019-06-16 DIAGNOSIS — R609 Edema, unspecified: Secondary | ICD-10-CM | POA: Insufficient documentation

## 2019-06-16 DIAGNOSIS — E119 Type 2 diabetes mellitus without complications: Secondary | ICD-10-CM | POA: Insufficient documentation

## 2019-06-16 DIAGNOSIS — R51 Headache: Secondary | ICD-10-CM | POA: Diagnosis not present

## 2019-06-16 DIAGNOSIS — R42 Dizziness and giddiness: Secondary | ICD-10-CM

## 2019-06-16 DIAGNOSIS — G4733 Obstructive sleep apnea (adult) (pediatric): Secondary | ICD-10-CM | POA: Insufficient documentation

## 2019-06-16 MED ORDER — AMLODIPINE BESYLATE 2.5 MG PO TABS: 2.5000 mg | ORAL_TABLET | Freq: Every day | ORAL | 3 refills | Status: DC

## 2019-06-16 NOTE — Progress Notes (Deleted)
Virtual Visit via Video Note  I connected with Kathleen Burke  on 06/16/19 at  by a video enabled telemedicine application and verified that I am speaking with the correct person using two identifiers.  Location patient: [facility] Location provider:work or home office Persons participating in the virtual visit: patient, provider  I discussed the limitations of evaluation and management by telemedicine and the availability of in person appointments. The patient expressed understanding and agreed to proceed.   HPI: 1. HTN on hctz 25 and toprol 25 BP 146/94 will driving and she had h/a in left eye and pulse was 101  2. C/o dizziness new since last visit this happened while driving recently but has happened to her before in the past years ago when picking kids up from school associated with LOC and h/a. She is not sure if this is associated with starting effexor 37.5 mg qd but reviewed side effect is dizziness 16% and numbness/tingling 2%  3. DM 2 A1c 6.5 last am cbgs 120s and exercising 30-45 min 7 days a week and intermittent fasting 7 days a week only eating 8 hrs a day and drinking enough water  4. OSA moderate on cpap  5. C/o tingling and numbness in 4 fingers not thumb of right hand and middle finger left hand she had trigger finger surgery on right hand and sleeps on her right side  Denies neck pain or elbow pain  6. Intermittent leg edema while standing at work wants Rx for compression stockings    ROS: See pertinent positives and negatives per HPI.  Past Medical History:  Diagnosis Date  . Anemia   . Anxiety 2014  . Arthritis   . Chicken pox   . Depression   . Frequent headaches   . GERD (gastroesophageal reflux disease)   . Hypertension   . Obesity (BMI 30-39.9)   . Pre-diabetes   . Trigger finger    MIDDLE FINGER RIGHT HAND SURGERY ON JULY 31ST 2019    Past Surgical History:  Procedure Laterality Date  . HYSTEROSCOPY W/D&C N/A 05/20/2018   Procedure: DILATATION AND CURETTAGE  /HYSTEROSCOPY;  Surgeon: Staebler, Andreas, MD;  Location: [facility] ORS;  Service: Gynecology;  Laterality: N/A;  . INTRAUTERINE DEVICE (IUD) INSERTION N/A 05/20/2018   Procedure: INTRAUTERINE DEVICE (IUD) INSERTION;  Surgeon: Staebler, Andreas, MD;  Location: [facility] OR[facility];  Service: Gynecology;  Laterality: N/A;  . WISDOM TOOTH EXTRACTION      Family History  Problem Relation Age of Onset  . Arthritis Mother   . Hyperlipidemia Mother   . Hypertension Mother   . Diabetes Mellitus I Father        DM I  . Breast cancer Maternal Grandmother 50  . Arthritis Paternal Grandmother   . Diabetes Mellitus II Paternal Grandmother   . Arthritis Paternal Grandfather   . Hyperlipidemia Paternal Grandfather   . Stroke Paternal Grandfather   . Hypertension Paternal Grandfather   . Diabetes Mellitus II Sister   . Diabetes Mellitus II Sister   . Hyperlipidemia Sister   . Hypertension Sister     SOCIAL HX: married with kids    Current Outpatient Medications:  .  amLODipine (NORVASC) 2.5 MG tablet, Take 1 tablet (2.5 mg total) by mouth daily., Disp: 90 tablet, Rfl: 3 .  blood glucose meter kit and supplies, Dispense based on patient and insurance preference. Use up to four times daily as directed. (FOR ICD-10 E10.9, E11.9)., Disp: 1 each, Rfl: 0 .  diclofenac sodium (VOLTAREN) 1 %   GEL, Apply 2 g topically 4 (four) times daily., Disp: 100 g, Rfl: 11 .  docusate sodium (COLACE) 100 MG capsule, Take 100 mg by mouth daily., Disp: , Rfl:  .  ferrous sulfate 325 (65 FE) MG tablet, Take 325 mg by mouth daily with breakfast., Disp: , Rfl:  .  hydrochlorothiazide (HYDRODIURIL) 25 MG tablet, 1 tab daily by mouth, Disp: 90 tablet, Rfl: 3 .  hydrocortisone 2.5 % cream, Apply topically 2 (two) times daily. As needed neck, face, Disp: 60 g, Rfl: 0 .  ketoconazole (NIZORAL) 2 % cream, , Disp: , Rfl:  .  metoprolol succinate (TOPROL-XL) 25 MG 24 hr tablet, Take 1 tablet (25 mg total) by mouth daily., Disp: 90 tablet, Rfl:  3 .  Multiple Vitamins-Minerals (MULTIVITAMIN PO), Take 1 packet by mouth See admin instructions. [name] Supplement Multivitamin Packet - Take 6 tablets by mouth in the morning and take 7 tablets by mouth at bedtime, Disp: , Rfl:  .  naproxen sodium (ALEVE) 220 MG tablet, Take 220 mg by mouth daily as needed., Disp: , Rfl:  .  triamcinolone cream (KENALOG) 0.1 %, Apply 1 application topically 2 (two) times daily., Disp: 80 g, Rfl: 0 .  venlafaxine XR ([name]) 37.5 MG 24 hr capsule, Take 1 capsule (37.5 mg total) by mouth daily., Disp: 90 capsule, Rfl: 3 .  VITAMIN D, ERGOCALCIFEROL, PO, Take 5,000 Units by mouth daily., Disp: , Rfl:   EXAM:  VITALS per patient if applicable:  GENERAL: alert, oriented, appears well and in no acute distress  HEENT: atraumatic, conjunttiva clear, no obvious abnormalities on inspection of external nose and ears  NECK: normal movements of the head and neck  LUNGS: on inspection no signs of respiratory distress, breathing rate appears normal, no obvious gross SOB, gasping or wheezing  CV: no obvious cyanosis  MS: moves all visible extremities without noticeable abnormality  PSYCH/NEURO: pleasant and cooperative, no obvious depression or anxiety, speech and thought processing grossly intact  ASSESSMENT AND PLAN:  Discussed the following assessment and plan:  Essential hypertension - Plan: amLODipine (NORVASC) 2.5 MG tablet qd to hctz 25 and toprol 25 mg qd   B12 deficiency - Plan: B12  Dizziness-? Etiology uncontrolled BP, vertigo orthostasis, effexor, r/o other I.e migraine vs other  -pt to check orthostatics today with her job  Stop effexor for now  -my chart in 1-2 weeks if still occurring will do MRI with and w/o contrast   Nonintractable headache, unspecified chronicity pattern, unspecified headache type -see above   Numbness and tingling in both hands -stop effexor  Consider EMG/NCS upper ext with neurology if continues and MRI brain  w/and w/o in fuure   Edema  Rx compression stockings 15-20 mmhg 8 pr  DM2  -exercise and healthy diet choices and repeat A1c in future   HM  Will get flu at work utd tdapdue 11/26/2019 Immune hep B and MMR  Pap [facility] 03/23/18 neg no HPV testing done Endometrial Bx 05/20/18 negative with IUD now Has IUD   mammo8/20/19 negativesch 06/17/19    Declines HIV testing  rec take D3 2000 IUD daily  Needs fasting labs 10/08/19   I discussed the assessment and treatment plan with the patient. The patient was provided an opportunity to ask questions and all were answered. The patient agreed with the plan and demonstrated an understanding of the instructions.   The patient was advised to call back or seek an in-person evaluation if the symptoms worsen or if   the condition fails to improve as anticipated.  Time spent 25 minutes Mckaylah Bettendorf N [name], MD        

## 2019-06-16 NOTE — Telephone Encounter (Signed)
Called patient for COVID-19 pre-screening for in office visit. ° °Have you recently traveled any where out of the local area in the last 2 weeks? No ° °Have you been in close contact with a person diagnosed with COVID-19 or someone awaiting results within the last 2 weeks? No ° °Do you currently have any of the following symptoms? If so, when did they start? °Cough     Diarrhea   Joint Pain °Fever      Muscle Pain   Red eyes °Shortness of breath   Abdominal pain  Vomiting °Loss of smell    Rash    Sore Throat °Headache    Weakness   Bruising or bleeding ° ° °Okay to proceed with visit 06/17/2019 ° ° °  ° ° °

## 2019-06-16 NOTE — Patient Instructions (Addendum)
Dizziness Dizziness is a common problem. It is a feeling of unsteadiness or light-headedness. You may feel like you are about to faint. Dizziness can lead to injury if you stumble or fall. Anyone can become dizzy, but dizziness is more common in older adults. This condition can be caused by a number of things, including medicines, dehydration, or illness. Follow these instructions at home: Eating and drinking  Drink enough fluid to keep your urine clear or pale yellow. This helps to keep you from becoming dehydrated. Try to drink more clear fluids, such as water.  Do not drink alcohol.  Limit your caffeine intake if told to do so by your health care provider. Check ingredients and nutrition facts to see if a food or beverage contains caffeine.  Limit your salt (sodium) intake if told to do so by your health care provider. Check ingredients and nutrition facts to see if a food or beverage contains sodium. Activity  Avoid making quick movements. ? Rise slowly from chairs and steady yourself until you feel okay. ? In the morning, first sit up on the side of the bed. When you feel okay, stand slowly while you hold onto something until you know that your balance is fine.  If you need to stand in one place for a long time, move your legs often. Tighten and relax the muscles in your legs while you are standing.  Do not drive or use heavy machinery if you feel dizzy.  Avoid bending down if you feel dizzy. Place items in your home so that they are easy for you to reach without leaning over. Lifestyle  Do not use any products that contain nicotine or tobacco, such as cigarettes and e-cigarettes. If you need help quitting, ask your health care provider.  Try to reduce your stress level by using methods such as yoga or meditation. Talk with your health care provider if you need help to manage your stress. General instructions  Watch your dizziness for any changes.  Take over-the-counter and  prescription medicines only as told by your health care provider. Talk with your health care provider if you think that your dizziness is caused by a medicine that you are taking.  Tell a friend or a family member that you are feeling dizzy. If he or she notices any changes in your behavior, have this person call your health care provider.  Keep all follow-up visits as told by your health care provider. This is important. Contact a health care provider if:  Your dizziness does not go away.  Your dizziness or light-headedness gets worse.  You feel nauseous.  You have reduced hearing.  You have new symptoms.  You are unsteady on your feet or you feel like the room is spinning. Get help right away if:  You vomit or have diarrhea and are unable to eat or drink anything.  You have problems talking, walking, swallowing, or using your arms, hands, or legs.  You feel generally weak.  You are not thinking clearly or you have trouble forming sentences. It may take a friend or family member to notice this.  You have chest pain, abdominal pain, shortness of breath, or sweating.  Your vision changes.  You have any bleeding.  You have a severe headache.  You have neck pain or a stiff neck.  You have a fever. These symptoms may represent a serious problem that is an emergency. Do not wait to see if the symptoms will go away. Get medical help   right away. Call your local emergency services (911 in the [place] Do not drive yourself to the hospital. Summary  Dizziness is a feeling of unsteadiness or light-headedness. This condition can be caused by a number of things, including medicines, dehydration, or illness.  Anyone can become dizzy, but dizziness is more common in older adults.  Drink enough fluid to keep your urine clear or pale yellow. Do not drink alcohol.  Avoid making quick movements if you feel dizzy. Monitor your dizziness for any changes. This information is not intended to  replace advice given to you by your health care provider. Make sure you discuss any questions you have with your health care provider. Document Released: 04/08/2001 Document Revised: 10/16/2017 Document Reviewed: 11/15/2016 Elsevier Patient Education  2020 [employer]  Orthostatic Hypotension Blood pressure is a measurement of how strongly, or weakly, your blood is pressing against the walls of your arteries. Orthostatic hypotension is a sudden drop in blood pressure that happens when you quickly change positions, such as when you get up from sitting or lying down. Arteries are blood vessels that carry blood from your heart throughout your body. When blood pressure is too low, you may not get enough blood to your brain or to the rest of your organs. This can cause weakness, light-headedness, rapid heartbeat, and fainting. This can last for just a few seconds or for up to a few minutes. Orthostatic hypotension is usually not a serious problem. However, if it happens frequently or gets worse, it may be a sign of something more serious. What are the causes? This condition may be caused by:  Sudden changes in posture, such as standing up quickly after you have been sitting or lying down.  Blood loss.  Loss of body fluids (dehydration).  Heart problems.  Hormone (endocrine) problems.  Pregnancy.  Severe infection.  Lack of certain nutrients.  Severe allergic reactions (anaphylaxis).  Certain medicines, such as blood pressure medicine or medicines that make the body lose excess fluids (diuretics). Sometimes, this condition can be caused by not taking medicine as directed, such as taking too much of a certain medicine. What increases the risk? The following factors may make you more likely to develop this condition:  Age. Risk increases as you get older.  Conditions that affect the heart or the central nervous system.  Taking certain medicines, such as blood pressure medicine or  diuretics.  Being pregnant. What are the signs or symptoms? Symptoms of this condition may include:  Weakness.  Light-headedness.  Dizziness.  Blurred vision.  Fatigue.  Rapid heartbeat.  Fainting, in severe cases. How is this diagnosed? This condition is diagnosed based on:  Your medical history.  Your symptoms.  Your blood pressure measurement. Your health care provider will check your blood pressure when you are: ? Lying down. ? Sitting. ? Standing. A blood pressure reading is recorded as two numbers, such as "120 over 80" (or 120/80). The first ("top") number is called the systolic pressure. It is a measure of the pressure in your arteries as your heart beats. The second ("bottom") number is called the diastolic pressure. It is a measure of the pressure in your arteries when your heart relaxes between beats. Blood pressure is measured in a unit called mm Hg. Healthy blood pressure for most adults is 120/80. If your blood pressure is below 90/60, you may be diagnosed with hypotension. Other information or tests that may be used to diagnose orthostatic hypotension include:  Your other vital signs,   signs, such as your heart rate and temperature.  Blood tests.  Tilt table test. For this test, you will be safely secured to a table that moves you from a lying position to an upright position. Your heart rhythm and blood pressure will be monitored during the test. How is this treated? This condition may be treated by:  Changing your diet. This may involve eating more salt (sodium) or drinking more water.  Taking medicines to raise your blood pressure.  Changing the dosage of certain medicines you are taking that might be lowering your blood pressure.  Wearing compression stockings. These stockings help to prevent blood clots and reduce swelling in your legs. In some cases, you may need to go to the hospital for:  Fluid replacement. This means you will receive fluids through an  IV.  Blood replacement. This means you will receive donated blood through an IV (transfusion).  Treating an infection or heart problems, if this applies.  Monitoring. You may need to be monitored while medicines that you are taking wear off. Follow these instructions at home: Eating and drinking   Drink enough fluid to keep your urine pale yellow.  Eat a healthy diet, and follow instructions from your health care provider about eating or drinking restrictions. A healthy diet includes: ? Fresh fruits and vegetables. ? Whole grains. ? Lean meats. ? Low-fat dairy products.  Eat extra salt only as directed. Do not add extra salt to your diet unless your health care provider told you to do that.  Eat frequent, small meals.  Avoid standing up suddenly after eating. Medicines  Take over-the-counter and prescription medicines only as told by your health care provider. ? Follow instructions from your health care provider about changing the dosage of your current medicines, if this applies. ? Do not stop or adjust any of your medicines on your own. General instructions   Wear compression stockings as told by your health care provider.  Get up slowly from lying down or sitting positions. This gives your blood pressure a chance to adjust.  Avoid hot showers and excessive heat as directed by your health care provider.  Return to your normal activities as told by your health care provider. Ask your health care provider what activities are safe for you.  Do not use any products that contain nicotine or tobacco, such as cigarettes, e-cigarettes, and chewing tobacco. If you need help quitting, ask your health care provider.  Keep all follow-up visits as told by your health care provider. This is important. Contact a health care provider if you:  Vomit.  Have diarrhea.  Have a fever for more than 2-3 days.  Feel more thirsty than usual.  Feel weak and tired. Get help right away if  you:  Have chest pain.  Have a fast or irregular heartbeat.  Develop numbness in any part of your body.  Cannot move your arms or your legs.  Have trouble speaking.  Become sweaty or feel light-headed.  Faint.  Feel short of breath.  Have trouble staying awake.  Feel confused. Summary  Orthostatic hypotension is a sudden drop in blood pressure that happens when you quickly change positions.  Orthostatic hypotension is usually not a serious problem.  It is diagnosed by having your blood pressure taken lying down, sitting, and then standing.  It may be treated by changing your diet or adjusting your medicines. This information is not intended to replace advice given to you by your health care provider. Make  you discuss any questions you have with your health care provider. Document Released: 10/03/2002 Document Revised: 04/08/2018 Document Reviewed: 04/08/2018 Elsevier Patient Education  2020 [employer].  
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
## 2019-06-17 ENCOUNTER — Ambulatory Visit: Payer: BC Managed Care – PPO | Admitting: Internal Medicine

## 2019-06-17 ENCOUNTER — Other Ambulatory Visit: Payer: Self-pay

## 2019-06-17 ENCOUNTER — Telehealth: Payer: Self-pay | Admitting: Internal Medicine

## 2019-06-17 ENCOUNTER — Ambulatory Visit
Admission: RE | Admit: 2019-06-17 | Discharge: 2019-06-17 | Disposition: A | Discharge status: Home / Self Care | Payer: BC Managed Care – PPO | Source: Ambulatory Visit | Attending: Internal Medicine | Admitting: Internal Medicine

## 2019-06-17 ENCOUNTER — Encounter: Payer: Self-pay | Admitting: Internal Medicine

## 2019-06-17 VITALS — BP 128/80 | HR 101 | Temp 98.1°F | Ht 66.0 in | Wt 241.6 lb

## 2019-06-17 DIAGNOSIS — Z1231 Encounter for screening mammogram for malignant neoplasm of breast: Secondary | ICD-10-CM | POA: Insufficient documentation

## 2019-06-17 DIAGNOSIS — G4733 Obstructive sleep apnea (adult) (pediatric): Secondary | ICD-10-CM | POA: Diagnosis not present

## 2019-06-17 LAB — COMPREHENSIVE METABOLIC PANEL
ALT: 27 IU/L (ref 0–32)
AST: 29 IU/L (ref 0–40)
Albumin/Globulin Ratio: 1.8 (ref 1.2–2.2)
Albumin: 4.9 g/dL — ABNORMAL HIGH (ref 3.8–4.8)
Alkaline Phosphatase: 94 IU/L (ref 39–117)
BUN/Creatinine Ratio: 18 (ref 9–23)
BUN: 12 mg/dL (ref 6–24)
Bilirubin Total: 0.2 mg/dL (ref 0.0–1.2)
CO2: 26 mmol/L (ref 20–29)
Calcium: 9.4 mg/dL (ref 8.7–10.2)
Chloride: 96 mmol/L (ref 96–106)
Creatinine, Ser: 0.68 mg/dL (ref 0.57–1.00)
GFR calc Af Amer: 122 mL/min/{1.73_m2} (ref >59)
GFR calc non Af Amer: 106 mL/min/{1.73_m2} (ref >59)
Globulin, Total: 2.8 g/dL (ref 1.5–4.5)
Glucose: 104 mg/dL — ABNORMAL HIGH (ref 65–99)
Potassium: 3.7 mmol/L (ref 3.5–5.2)
Sodium: 138 mmol/L (ref 134–144)
Total Protein: 7.7 g/dL (ref 6.0–8.5)

## 2019-06-17 LAB — B12 AND FOLATE PANEL
Folate: 14.2 ng/mL (ref >3.0)
Vitamin B-12: 277 pg/mL (ref 232–1245)

## 2019-06-17 LAB — T4, FREE: Free T4: 0.9 ng/dL (ref 0.82–1.77)

## 2019-06-17 LAB — TSH: TSH: 1.3 u[IU]/mL (ref 0.450–4.500)

## 2019-06-17 LAB — HGB A1C W/O EAG: Hgb A1c MFr Bld: 6.3 % — ABNORMAL HIGH (ref 4.8–5.6)

## 2019-06-17 LAB — LIPID PANEL W/O CHOL/HDL RATIO
Cholesterol, Total: 215 mg/dL — ABNORMAL HIGH (ref 100–199)
HDL: 72 mg/dL (ref >39)
LDL Calculated: 134 mg/dL — ABNORMAL HIGH (ref 0–99)
Triglycerides: 45 mg/dL (ref 0–149)
VLDL Cholesterol Cal: 9 mg/dL (ref 5–40)

## 2019-06-17 NOTE — Patient Instructions (Signed)
Will change auto pressure range to 9-20 cm H2O.  You can decrease the humidity on your machine.  Continue to use cpap every night.

## 2019-06-17 NOTE — Progress Notes (Signed)
[facility]      Assessment and Plan:  Obstructive sleep apnea. - Moderate OSA with AHI of 16. --He is doing better during the day, feeling more awake during the day. -Occasional snores, will adjust pressure range to new range of 9-16 cm of H2O.  Essential hypertension. - Sleep apnea can contribute to above, therefore treatment of sleep apnea is important part of management.  No orders of the defined types were placed in this encounter.   Return in about 1 year (around 06/16/2020).   Date: 06/17/2019  MRN# [id number] Kathleen Burke 1976/04/11  Referring Physician: Dr. Terese Door for snoring.   Kathleen Burke is a 43 y.o. old female seen in consultation for chief complaint of:     HPI:  She has been doing well with CPAP, she sometimes takes if off in the morning because there is too much water and drips on her face. Her husband states that she still occasionally snores.  She is less tired during the day, she is not naping during the day.  She cleans her device supplies regularly.  Denies sleep walking currenlty but did as a child, does have occasional sleep paralysis, denies cataplexy, denies jaw pain, denies TMJ, no dentures.   Has am headaches. Has gained 40 lbs in last few years. She has htn.   **CPAP download 05/16/2019-06/14/2019>> raw data personally reviewed.  Uses greater than 4 hours is 30/30 days.  Average usage on days used is 7 hours.  Pressure ranges 5-20.  Leaks are within normal limits.  Median pressure is 10, 95th percentile pressure is 15, maximum pressure of 16.6.  Residual AHI is 0.6.  Overall this shows excellent compliance with CPAP with excellent control obstructive sleep apnea. **HST 04/11/2019>> moderate OSA with AHI of 16. Recommend auto cpap 5-20cm h2O.  Social Hx:   Social History   Tobacco Use  . Smoking status: Never Smoker  . Smokeless tobacco: Never Used  Substance Use Topics  . Alcohol use: Yes    Alcohol/week: 2.0  standard drinks    Types: 2 Glasses of wine per week    Comment: OCC-WEEKENDS  . Drug use: No   Medication:    Current Outpatient Medications:  .  amLODipine (NORVASC) 2.5 MG tablet, Take 1 tablet (2.5 mg total) by mouth daily., Disp: 90 tablet, Rfl: 3 .  blood glucose meter kit and supplies, Dispense based on patient and insurance preference. Use up to four times daily as directed. (FOR ICD-10 E10.9, E11.9)., Disp: 1 each, Rfl: 0 .  diclofenac sodium (VOLTAREN) 1 % GEL, Apply 2 g topically 4 (four) times daily., Disp: 100 g, Rfl: 11 .  docusate sodium (COLACE) 100 MG capsule, Take 100 mg by mouth daily., Disp: , Rfl:  .  ferrous sulfate 325 (65 FE) MG tablet, Take 325 mg by mouth daily with breakfast., Disp: , Rfl:  .  hydrochlorothiazide (HYDRODIURIL) 25 MG tablet, 1 tab daily by mouth, Disp: 90 tablet, Rfl: 3 .  hydrocortisone 2.5 % cream, Apply topically 2 (two) times daily. As needed neck, face, Disp: 60 g, Rfl: 0 .  ketoconazole (NIZORAL) 2 % cream, , Disp: , Rfl:  .  metoprolol succinate (TOPROL-XL) 25 MG 24 hr tablet, Take 1 tablet (25 mg total) by mouth daily., Disp: 90 tablet, Rfl: 3 .  Multiple Vitamins-Minerals (MULTIVITAMIN PO), Take 1 packet by mouth See admin instructions. [name] Supplement Multivitamin Packet - Take 6 tablets by mouth in the morning and take 7 tablets  by mouth at bedtime, Disp: , Rfl:  .  naproxen sodium (ALEVE) 220 MG tablet, Take 220 mg by mouth daily as needed., Disp: , Rfl:  .  triamcinolone cream (KENALOG) 0.1 %, Apply 1 application topically 2 (two) times daily., Disp: 80 g, Rfl: 0 .  venlafaxine XR ([name]) 37.5 MG 24 hr capsule, Take 1 capsule (37.5 mg total) by mouth daily., Disp: 90 capsule, Rfl: 3 .  VITAMIN D, ERGOCALCIFEROL, PO, Take 5,000 Units by mouth daily., Disp: , Rfl:    Allergies:  Latex  Review of Systems:  Constitutional: Feels well. Cardiovascular: Denies chest pain, exertional chest pain.  Pulmonary: Denies hemoptysis,  pleuritic chest pain.   The remainder of systems were reviewed and were found to be negative other than what is documented in the HPI.   Physical Examination:   VS: BP 128/80 (BP Location: Left Arm, Cuff Size: Normal)   Pulse (!) 101   Temp 98.1 F (36.7 C) (Temporal)   Ht '5\' 6"'  (1.676 m)   Wt 241 lb 9.6 oz (109.6 kg)   SpO2 100%   BMI 39.00 kg/m   General Appearance: No distress  Neuro:without focal findings, mental status, speech normal, alert and oriented HEENT: PERRLA, EOM intact Pulmonary: No wheezing, No rales  CardiovascularNormal S1,S2.  No m/r/g.  Abdomen: Benign, Soft, non-tender, No masses Renal:  No costovertebral tenderness  GU:  No performed at this time. Endoc: No evident thyromegaly, no signs of acromegaly or Cushing features Skin:   warm, no rashes, no ecchymosis  Extremities: normal, no cyanosis, clubbing.      LABORATORY PANEL:   CBC No results for input(s): WBC, HGB, HCT, PLT in the last 168 hours. ------------------------------------------------------------------------------------------------------------------  Chemistries  No results for input(s): NA, K, CL, CO2, GLUCOSE, BUN, CREATININE, CALCIUM, MG, AST, ALT, ALKPHOS, BILITOT in the last 168 hours.  Invalid input(s): GFRCGP ------------------------------------------------------------------------------------------------------------------  Cardiac Enzymes No results for input(s): TROPONINI in the last 168 hours. ------------------------------------------------------------  RADIOLOGY:  Mm 3d Screen Breast Bilateral  Result Date: 06/17/2019 CLINICAL DATA:  Screening. EXAM: DIGITAL SCREENING BILATERAL MAMMOGRAM WITH TOMO AND CAD COMPARISON:  Previous exam(s). ACR Breast Density Category b: There are scattered areas of fibroglandular density. FINDINGS: There are no findings suspicious for malignancy. Images were processed with CAD. IMPRESSION: No mammographic evidence of malignancy. A result letter of  this screening mammogram will be mailed directly to the patient. RECOMMENDATION: Screening mammogram in one year. (Code:SM-B-01Y) BI-RADS CATEGORY  1: Negative. Electronically Signed   By: Fidela Salisbury M.D.   On: 06/17/2019 10:01       Thank  you for the consultation and for allowing [facility] to assist in the care of your patient. Our recommendations are noted above.  Please contact us if we can be of further service.   Marda Stalker, M.D., F.C.C.P.  Board Certified in Internal Medicine, Pulmonary Medicine, [place], and Sleep Medicine.  [facility] Office Number: [phone]   06/17/2019

## 2019-06-17 NOTE — Telephone Encounter (Signed)
Paperwork given to pcp on desk

## 2019-06-17 NOTE — Telephone Encounter (Signed)
Pt came in and dropped off her lab results from [place]. Its in color folder up front.

## 2019-06-20 ENCOUNTER — Other Ambulatory Visit: Payer: Self-pay | Admitting: Internal Medicine

## 2019-06-20 ENCOUNTER — Encounter: Payer: Self-pay | Admitting: Internal Medicine

## 2019-06-20 DIAGNOSIS — R200 Anesthesia of skin: Secondary | ICD-10-CM

## 2019-07-08 ENCOUNTER — Telehealth: Payer: Self-pay

## 2019-07-08 ENCOUNTER — Other Ambulatory Visit: Payer: Self-pay

## 2019-07-08 ENCOUNTER — Ambulatory Visit (INDEPENDENT_AMBULATORY_CARE_PROVIDER_SITE_OTHER): Payer: BC Managed Care – PPO | Admitting: Obstetrics and Gynecology

## 2019-07-08 ENCOUNTER — Encounter: Payer: Self-pay | Admitting: Obstetrics and Gynecology

## 2019-07-08 VITALS — BP 130/80 | HR 92 | Ht 66.0 in | Wt 245.0 lb

## 2019-07-08 DIAGNOSIS — Z1211 Encounter for screening for malignant neoplasm of colon: Secondary | ICD-10-CM

## 2019-07-08 DIAGNOSIS — Z01419 Encounter for gynecological examination (general) (routine) without abnormal findings: Secondary | ICD-10-CM

## 2019-07-08 DIAGNOSIS — Z1239 Encounter for other screening for malignant neoplasm of breast: Secondary | ICD-10-CM

## 2019-07-08 MED ORDER — NA SULFATE-K SULFATE-MG SULF 17.5-3.13-1.6 GM/177ML PO SOLN: 1.0000 | Freq: Once | ORAL | 0 refills | Status: AC

## 2019-07-08 NOTE — Telephone Encounter (Signed)
Gastroenterology Pre-Procedure Review  Request Date: 07/18/19 Requesting Physician: Dr. Allen Norris  PATIENT REVIEW QUESTIONS: The patient responded to the following health history questions as indicated:    1. Are you having any GI issues? no 2. Do you have a personal history of Polyps? no 3. Do you have a family history of Colon Cancer or Polyps? yes (mom polyps) 4. Diabetes Mellitus? yes (controlled with diet and exercise) 5. Joint replacements in the past 12 months?no 6. Major health problems in the past 3 months?no 7. Any artificial heart valves, MVP, or defibrillator?no    MEDICATIONS & ALLERGIES:    Patient reports the following regarding taking any anticoagulation/antiplatelet therapy:   Plavix, Coumadin, Eliquis, Xarelto, Lovenox, Pradaxa, Brilinta, or Effient? no Aspirin? no  Patient confirms/reports the following medications:  Current Outpatient Medications  Medication Sig Dispense Refill  . amLODipine (NORVASC) 2.5 MG tablet Take 1 tablet (2.5 mg total) by mouth daily. 90 tablet 3  . blood glucose meter kit and supplies Dispense based on patient and insurance preference. Use up to four times daily as directed. (FOR ICD-10 E10.9, E11.9). 1 each 0  . diclofenac sodium (VOLTAREN) 1 % GEL Apply 2 g topically 4 (four) times daily. 100 g 11  . docusate sodium (COLACE) 100 MG capsule Take 100 mg by mouth daily.    . ferrous sulfate 325 (65 FE) MG tablet Take 325 mg by mouth daily with breakfast.    . hydrochlorothiazide (HYDRODIURIL) 25 MG tablet 1 tab daily by mouth 90 tablet 3  . hydrocortisone 2.5 % cream Apply topically 2 (two) times daily. As needed neck, face 60 g 0  . ketoconazole (NIZORAL) 2 % cream     . metoprolol succinate (TOPROL-XL) 25 MG 24 hr tablet Take 1 tablet (25 mg total) by mouth daily. 90 tablet 3  . Multiple Vitamins-Minerals (MULTIVITAMIN PO) Take 1 packet by mouth See admin instructions. [name] Supplement Multivitamin Packet - Take 6 tablets by mouth in the  morning and take 7 tablets by mouth at bedtime    . naproxen sodium (ALEVE) 220 MG tablet Take 220 mg by mouth daily as needed.    . triamcinolone cream (KENALOG) 0.1 % Apply 1 application topically 2 (two) times daily. 80 g 0  . venlafaxine XR ([name]) 37.5 MG 24 hr capsule Take 1 capsule (37.5 mg total) by mouth daily. 90 capsule 3  . VITAMIN D, ERGOCALCIFEROL, PO Take 5,000 Units by mouth daily.     No current facility-administered medications for this visit.     Patient confirms/reports the following allergies:  Allergies  Allergen Reactions  . Latex Itching    GLOVES    No orders of the defined types were placed in this encounter.   AUTHORIZATION INFORMATION Primary Insurance: 1D#: Group #:  Secondary Insurance: 1D#: Group #:  SCHEDULE INFORMATION: Date: 07/18/19 Time: Location:[facility]

## 2019-07-08 NOTE — Progress Notes (Signed)
Gynecology Annual Exam  PCP: [name], [name], MD  Chief Complaint:  Chief Complaint  Patient presents with  . Gynecologic Exam    History of Present Illness: Patient is a 43 y.o. [id number] presents for annual exam. The patient has no complaints today.   LMP: No LMP recorded. (Menstrual status: IUD). Absent on IUD  The patient is sexually active. She currently uses IUD for contraception. She denies dyspareunia.  The patient does perform self breast exams.  There is no notable family history of breast or ovarian cancer in her family.  The patient wears seatbelts: yes.   The patient has regular exercise: not asked.    The patient denies current symptoms of depression.    Review of Systems: Review of Systems  Constitutional: Negative for chills and fever.  HENT: Negative for congestion.   Respiratory: Negative for cough and shortness of breath.   Cardiovascular: Negative for chest pain and palpitations.  Gastrointestinal: Negative for abdominal pain, constipation, diarrhea, heartburn, nausea and vomiting.  Genitourinary: Negative for dysuria, frequency and urgency.  Skin: Negative for itching and rash.  Neurological: Negative for dizziness and headaches.  Endo/Heme/Allergies: Negative for polydipsia.  Psychiatric/Behavioral: Negative for depression.    Past Medical History:  Past Medical History:  Diagnosis Date  . Anemia   . Anxiety 2014  . Arthritis   . Chicken pox   . Depression   . Frequent headaches   . GERD (gastroesophageal reflux disease)   . Hypertension   . Obesity (BMI 30-39.9)   . Pre-diabetes   . Trigger finger    MIDDLE FINGER RIGHT HAND SURGERY ON JULY 31ST 2019    Past Surgical History:  Past Surgical History:  Procedure Laterality Date  . HYSTEROSCOPY W/D&C N/A 05/20/2018   Procedure: DILATATION AND CURETTAGE /HYSTEROSCOPY;  Surgeon: [name], MD;  Location: [facility] ORS;  Service: Gynecology;  Laterality: N/A;  . INTRAUTERINE  DEVICE (IUD) INSERTION N/A 05/20/2018   Procedure: INTRAUTERINE DEVICE (IUD) INSERTION;  Surgeon: [name], MD;  Location: [facility] ORS;  Service: Gynecology;  Laterality: N/A;  . WISDOM TOOTH EXTRACTION      Gynecologic History:  No LMP recorded. (Menstrual status: IUD). Contraception:05/20/2018 Mirena IUD Last Pap: Results were: 03/23/2018 NIL and HR HPV negative  Last mammogram: 06/17/2019 Results were: BI-RAD I  Obstetric History: [id number]  Family History:  Family History  Problem Relation Age of Onset  . Arthritis Mother   . Hyperlipidemia Mother   . Hypertension Mother   . Diabetes Mellitus I Father        DM I  . Breast cancer Maternal Grandmother 21  . Arthritis Paternal Grandmother   . Diabetes Mellitus II Paternal Grandmother   . Arthritis Paternal Grandfather   . Hyperlipidemia Paternal Grandfather   . Stroke Paternal Grandfather   . Hypertension Paternal Grandfather   . Diabetes Mellitus II Sister   . Diabetes Mellitus II Sister   . Hyperlipidemia Sister   . Hypertension Sister     Social History:  Social History   Socioeconomic History  . Marital status: Married    Spouse name: Not on file  . Number of children: 2  . Years of education: Not on file  . Highest education level: Not on file  Occupational History  . Occupation: Therapist, sports  Social Needs  . Financial resource strain: Not on file  . Food insecurity    Worry: Not on file    Inability: Not on file  . Transportation needs  Medical: Not on file    Non-medical: Not on file  Tobacco Use  . Smoking status: Never Smoker  . Smokeless tobacco: Never Used  Substance and Sexual Activity  . Alcohol use: Yes    Alcohol/week: 2.0 standard drinks    Types: 2 Glasses of wine per week    Comment: OCC-WEEKENDS  . Drug use: No  . Sexual activity: Yes    Partners: Male    Birth control/protection: Surgical    Comment: vasectomy  Lifestyle  . Physical activity    Days per week: 0 days    Minutes per  session: 0 min  . Stress: Not at all  Relationships  . Social Herbalist on phone: Not on file    Gets together: Not on file    Attends religious service: Not on file    Active member of club or organization: Not on file    Attends meetings of clubs or organizations: Not on file    Relationship status: Not on file  . Intimate partner violence    Fear of current or ex partner: Not on file    Emotionally abused: Not on file    Physically abused: Not on file    Forced sexual activity: Not on file  Other Topics Concern  . Not on file  Social History Narrative   Married    2 kids    Works occup. [employer]     Allergies:  Allergies  Allergen Reactions  . Latex Itching    GLOVES    Medications: Prior to Admission medications   Medication Sig Start Date End Date Taking? Authorizing Provider  amLODipine (NORVASC) 2.5 MG tablet Take 1 tablet (2.5 mg total) by mouth daily. 06/16/19  Yes [name], [name], MD  blood glucose meter kit and supplies Dispense based on patient and insurance preference. Use up to four times daily as directed. (FOR ICD-10 E10.9, E11.9). 05/03/19  Yes [name], [name], MD  diclofenac sodium (VOLTAREN) 1 % GEL Apply 2 g topically 4 (four) times daily. 12/02/18  Yes [name], [name], MD  docusate sodium (COLACE) 100 MG capsule Take 100 mg by mouth daily.   Yes [provider]  ferrous sulfate 325 (65 FE) MG tablet Take 325 mg by mouth daily with breakfast.   Yes [provider]  hydrochlorothiazide (HYDRODIURIL) 25 MG tablet 1 tab daily by mouth 03/24/19  Yes [name], [name], MD  hydrocortisone 2.5 % cream Apply topically 2 (two) times daily. As needed neck, face 12/01/18  Yes [name], [name], MD  metoprolol succinate (TOPROL-XL) 25 MG 24 hr tablet Take 1 tablet (25 mg total) by mouth daily. 12/02/18  Yes [name], [name], MD  Multiple Vitamins-Minerals (MULTIVITAMIN PO) Take 1 packet by mouth  See admin instructions. [name] Supplement Multivitamin Packet - Take 6 tablets by mouth in the morning and take 7 tablets by mouth at bedtime   Yes [provider]  naproxen sodium (ALEVE) 220 MG tablet Take 220 mg by mouth daily as needed.   Yes [provider]  triamcinolone cream (KENALOG) 0.1 % Apply 1 application topically 2 (two) times daily. 12/01/18  Yes [name], [name], MD  venlafaxine XR ([name]) 37.5 MG 24 hr capsule Take 1 capsule (37.5 mg total) by mouth daily. 03/24/19  Yes [name], [name], MD  VITAMIN D, ERGOCALCIFEROL, PO Take 5,000 Units by mouth daily.   Yes [provider]  ketoconazole (NIZORAL) 2 % cream  04/20/19  [provider]    Physical Exam Vitals: Blood pressure 130/80, pulse 92, height _0  (1.676 m), weight 245 lb (111.1 kg).  General: NAD HEENT: normocephalic, anicteric Thyroid: no enlargement, no palpable nodules Pulmonary: No increased work of breathing, CTAB Cardiovascular: RRR, distal pulses 2+ Breast: Breast symmetrical, no tenderness, no palpable nodules or masses, no skin or nipple retraction present, no nipple discharge.  No axillary or supraclavicular lymphadenopathy. Abdomen: NABS, soft, non-tender, non-distended.  Umbilicus without lesions.  No hepatomegaly, splenomegaly or masses palpable. No evidence of hernia  Genitourinary:  External: Normal external female genitalia.  Normal urethral meatus, normal Bartholin's and Skene's glands.    Vagina: Normal vaginal mucosa, no evidence of prolapse.    Cervix: Grossly normal in appearance, no bleeding  Uterus: Non-enlarged, mobile, normal contour.  No CMT, IUD strings 2cm  Adnexa: ovaries non-enlarged, no adnexal masses  Rectal: deferred  Lymphatic: no evidence of inguinal lymphadenopathy Extremities: no edema, erythema, or tenderness Neurologic: Grossly intact Psychiatric: mood appropriate, affect full  Female chaperone present for pelvic and  breast  portions of the physical exam    Assessment: 44 y.o. [id number] routine annual exam  Plan: Problem List Items Addressed This Visit    None    Visit Diagnoses    Encounter for gynecological examination without abnormal finding    -  Primary   Breast screening       Colon cancer screening       Relevant Orders   Ambulatory referral to Gastroenterology      1) Mammogram - recommend yearly screening mammogram.  Mammogram Is up to date   2) STI screening  was notoffered and therefore not obtained  3) ASCCP guidelines and rational discussed.  Patient opts for every 3 years screening interval  4) Contraception - the patient is currently using  IUD.  She is happy with her current form of contraception and plans to continue  5) Colonoscopy -- Set up for screening colonoscopy  6) Routine healthcare maintenance including cholesterol, diabetes screening discussed managed by PCP  7) Return in about 1 year (around 07/07/2020) for annual.   [name], MD, [place], [facility] 07/08/2019, 9:05 AM

## 2019-07-11 ENCOUNTER — Other Ambulatory Visit: Payer: Self-pay

## 2019-07-11 ENCOUNTER — Encounter: Payer: Self-pay | Admitting: *Deleted

## 2019-07-11 ENCOUNTER — Telehealth: Payer: Self-pay | Admitting: Gastroenterology

## 2019-07-11 NOTE — Telephone Encounter (Signed)
Pt left vm for Sharyn Lull she states she contacted her insurance and  Wanted to have a screening colonoscopy since she is 30 of age but he told her no and she is not sure what to do please call pt

## 2019-07-12 NOTE — Telephone Encounter (Signed)
Returned patients call.  She informed me that her insurance does not cover her to have Colonoscopy until the age of 1.  She discussed with her husband and has decided to keep colonoscopy as scheduled and pay out of pocket.  Thanks  [employer]

## 2019-07-12 NOTE — Anesthesia Preprocedure Evaluation (Addendum)
Anesthesia Evaluation  Patient identified by MRN, date of birth, ID band Patient awake    Reviewed: Allergy & Precautions, NPO status , Patient's Chart, lab work & pertinent test results  History of Anesthesia Complications Negative for: history of anesthetic complications  Airway Mallampati: II  TM Distance: >3 FB Neck ROM: Full    Dental   Pulmonary sleep apnea ,    breath sounds clear to auscultation       Cardiovascular hypertension, (-) angina(-) DOE  Rhythm:Regular Rate:Normal     Neuro/Psych  Headaches, PSYCHIATRIC DISORDERS Anxiety Depression    GI/Hepatic GERD  Controlled,  Endo/Other  diabetes (Pre-DM)  Renal/GU      Musculoskeletal  (+) Arthritis ,   Abdominal (+) + obese (BMI 40),   Peds  Hematology  (+) Blood dyscrasia, anemia ,   Anesthesia Other Findings   Reproductive/Obstetrics                            Anesthesia Physical Anesthesia Plan  ASA: III  Anesthesia Plan: General   Post-op Pain Management:    Induction: Intravenous  PONV Risk Score and Plan: 3 and Propofol infusion, TIVA and Treatment may vary due to age or medical condition  Airway Management Planned: Natural Airway and Nasal Cannula  Additional Equipment:   Intra-op Plan:   Post-operative Plan:   Informed Consent: I have reviewed the patients History and Physical, chart, labs and discussed the procedure including the risks, benefits and alternatives for the proposed anesthesia with the patient or authorized representative who has indicated his/her understanding and acceptance.       Plan Discussed with: CRNA and Anesthesiologist  Anesthesia Plan Comments:         Anesthesia Quick Evaluation

## 2019-07-14 ENCOUNTER — Other Ambulatory Visit: Payer: Self-pay

## 2019-07-14 ENCOUNTER — Other Ambulatory Visit
Admission: RE | Admit: 2019-07-14 | Discharge: 2019-07-14 | Disposition: A | Discharge status: Home / Self Care | Payer: BC Managed Care – PPO | Source: Ambulatory Visit | Attending: Gastroenterology | Admitting: Gastroenterology

## 2019-07-14 DIAGNOSIS — Z20828 Contact with and (suspected) exposure to other viral communicable diseases: Secondary | ICD-10-CM | POA: Insufficient documentation

## 2019-07-14 DIAGNOSIS — Z01812 Encounter for preprocedural laboratory examination: Secondary | ICD-10-CM | POA: Insufficient documentation

## 2019-07-14 LAB — SARS CORONAVIRUS 2 (TAT 6-24 HRS): SARS Coronavirus 2: NEGATIVE

## 2019-07-15 NOTE — Discharge Instructions (Signed)

## 2019-07-18 ENCOUNTER — Other Ambulatory Visit: Payer: Self-pay

## 2019-07-18 ENCOUNTER — Encounter
Admission: RE | Disposition: A | Discharge status: Home / Self Care | Payer: Self-pay | Source: Home / Self Care | Attending: Gastroenterology

## 2019-07-18 ENCOUNTER — Ambulatory Visit
Admission: RE | Admit: 2019-07-18 | Discharge: 2019-07-18 | Disposition: A | Discharge status: Home / Self Care | Payer: BC Managed Care – PPO | Attending: Gastroenterology | Admitting: Gastroenterology

## 2019-07-18 ENCOUNTER — Ambulatory Visit: Payer: BC Managed Care – PPO | Admitting: Anesthesiology

## 2019-07-18 DIAGNOSIS — F419 Anxiety disorder, unspecified: Secondary | ICD-10-CM | POA: Diagnosis not present

## 2019-07-18 DIAGNOSIS — Z8249 Family history of ischemic heart disease and other diseases of the circulatory system: Secondary | ICD-10-CM | POA: Insufficient documentation

## 2019-07-18 DIAGNOSIS — D649 Anemia, unspecified: Secondary | ICD-10-CM | POA: Diagnosis not present

## 2019-07-18 DIAGNOSIS — Z803 Family history of malignant neoplasm of breast: Secondary | ICD-10-CM | POA: Diagnosis not present

## 2019-07-18 DIAGNOSIS — Z6838 Body mass index (BMI) 38.0-38.9, adult: Secondary | ICD-10-CM | POA: Insufficient documentation

## 2019-07-18 DIAGNOSIS — F329 Major depressive disorder, single episode, unspecified: Secondary | ICD-10-CM | POA: Diagnosis not present

## 2019-07-18 DIAGNOSIS — G4730 Sleep apnea, unspecified: Secondary | ICD-10-CM | POA: Diagnosis not present

## 2019-07-18 DIAGNOSIS — Z1211 Encounter for screening for malignant neoplasm of colon: Secondary | ICD-10-CM | POA: Insufficient documentation

## 2019-07-18 DIAGNOSIS — M1711 Unilateral primary osteoarthritis, right knee: Secondary | ICD-10-CM | POA: Insufficient documentation

## 2019-07-18 DIAGNOSIS — R7303 Prediabetes: Secondary | ICD-10-CM | POA: Insufficient documentation

## 2019-07-18 DIAGNOSIS — E669 Obesity, unspecified: Secondary | ICD-10-CM | POA: Diagnosis not present

## 2019-07-18 DIAGNOSIS — K219 Gastro-esophageal reflux disease without esophagitis: Secondary | ICD-10-CM | POA: Diagnosis not present

## 2019-07-18 DIAGNOSIS — Z8261 Family history of arthritis: Secondary | ICD-10-CM | POA: Diagnosis not present

## 2019-07-18 DIAGNOSIS — Z79899 Other long term (current) drug therapy: Secondary | ICD-10-CM | POA: Diagnosis not present

## 2019-07-18 DIAGNOSIS — Z791 Long term (current) use of non-steroidal anti-inflammatories (NSAID): Secondary | ICD-10-CM | POA: Insufficient documentation

## 2019-07-18 DIAGNOSIS — R51 Headache: Secondary | ICD-10-CM | POA: Insufficient documentation

## 2019-07-18 DIAGNOSIS — Z91040 Latex allergy status: Secondary | ICD-10-CM | POA: Diagnosis not present

## 2019-07-18 DIAGNOSIS — I10 Essential (primary) hypertension: Secondary | ICD-10-CM | POA: Insufficient documentation

## 2019-07-18 DIAGNOSIS — Z833 Family history of diabetes mellitus: Secondary | ICD-10-CM | POA: Insufficient documentation

## 2019-07-18 DIAGNOSIS — M1990 Unspecified osteoarthritis, unspecified site: Secondary | ICD-10-CM | POA: Insufficient documentation

## 2019-07-18 DIAGNOSIS — Z823 Family history of stroke: Secondary | ICD-10-CM | POA: Diagnosis not present

## 2019-07-18 HISTORY — PX: COLONOSCOPY WITH PROPOFOL: SHX5780

## 2019-07-18 HISTORY — DX: Sleep apnea, unspecified: G47.30

## 2019-07-18 LAB — GLUCOSE, CAPILLARY: Glucose-Capillary: 109 mg/dL — ABNORMAL HIGH (ref 70–99)

## 2019-07-18 LAB — POCT PREGNANCY, URINE: Preg Test, Ur: NEGATIVE

## 2019-07-18 SURGERY — COLONOSCOPY WITH PROPOFOL
Anesthesia: General

## 2019-07-18 MED ORDER — STERILE WATER FOR IRRIGATION IR SOLN
Status: DC | PRN
  Administered 2019-07-18: 100 mL

## 2019-07-18 MED ORDER — LIDOCAINE HCL (CARDIAC) PF 100 MG/5ML IV SOSY
PREFILLED_SYRINGE | INTRAVENOUS | Status: DC | PRN
  Administered 2019-07-18: 30 mg via INTRAVENOUS

## 2019-07-18 MED ORDER — PROPOFOL 10 MG/ML IV BOLUS
INTRAVENOUS | Status: DC | PRN
  Administered 2019-07-18 (×2): 20 mg via INTRAVENOUS
  Administered 2019-07-18: 40 mg via INTRAVENOUS
  Administered 2019-07-18: 140 mg via INTRAVENOUS
  Administered 2019-07-18 (×3): 20 mg via INTRAVENOUS
  Administered 2019-07-18: 40 mg via INTRAVENOUS
  Administered 2019-07-18 (×2): 20 mg via INTRAVENOUS

## 2019-07-18 MED ORDER — LACTATED RINGERS IV SOLN
100.0000 mL/h | INTRAVENOUS | Status: DC
  Administered 2019-07-18: 09:00:00 via INTRAVENOUS

## 2019-07-18 SURGICAL SUPPLY — 24 items
CANISTER SUCT 1200ML W/VALVE (MISCELLANEOUS) ×2 IMPLANT
CLIP HMST 235XBRD CATH ROT (MISCELLANEOUS) IMPLANT
CLIP RESOLUTION 360 11X235 (MISCELLANEOUS)
ELECT REM PT RETURN 9FT ADLT (ELECTROSURGICAL)
ELECTRODE REM PT RTRN 9FT ADLT (ELECTROSURGICAL) IMPLANT
FCP ESCP3.2XJMB 240X2.8X (MISCELLANEOUS)
FORCEPS BIOP RAD 4 LRG CAP 4 (CUTTING FORCEPS) IMPLANT
FORCEPS BIOP RJ4 240 W/NDL (MISCELLANEOUS)
FORCEPS ESCP3.2XJMB 240X2.8X (MISCELLANEOUS) IMPLANT
GOWN CVR UNV OPN BCK APRN NK (MISCELLANEOUS) ×2 IMPLANT
GOWN ISOL THUMB LOOP REG UNIV (MISCELLANEOUS) ×4
INJECTOR VARIJECT VIN23 (MISCELLANEOUS) IMPLANT
KIT DEFENDO VALVE AND CONN (KITS) IMPLANT
KIT ENDO PROCEDURE OLY (KITS) ×2 IMPLANT
MARKER SPOT ENDO TATTOO 5ML (MISCELLANEOUS) IMPLANT
PROBE APC STR FIRE (PROBE) IMPLANT
RETRIEVER NET ROTH 2.5X230 LF (MISCELLANEOUS) IMPLANT
SNARE SHORT THROW 13M SML OVAL (MISCELLANEOUS) IMPLANT
SNARE SHORT THROW 30M LRG OVAL (MISCELLANEOUS) IMPLANT
SNARE SNG USE RND 15MM (INSTRUMENTS) IMPLANT
SPOT EX ENDOSCOPIC TATTOO (MISCELLANEOUS)
TRAP ETRAP POLY (MISCELLANEOUS) IMPLANT
VARIJECT INJECTOR VIN23 (MISCELLANEOUS)
WATER STERILE IRR 250ML POUR (IV SOLUTION) ×2 IMPLANT

## 2019-07-18 NOTE — H&P (Signed)
Kathleen Lame, MD [address] [name], [name] [address] Phone: [phone] Fax : [phone]  Primary Care Physician:  [name], [name], MD Primary Gastroenterologist:  Dr. Allen Norris  Pre-Procedure History & Physical: HPI:  Kathleen Burke is a 43 y.o. female is here for a screening colonoscopy.   Past Medical History:  Diagnosis Date  . Anemia   . Anxiety 2014  . Arthritis    right arm, right knee  . Chicken pox   . Depression   . Frequent headaches   . GERD (gastroesophageal reflux disease)   . Hypertension   . Obesity (BMI 30-39.9)   . Pre-diabetes   . Sleep apnea    CPAP  . Trigger finger    MIDDLE FINGER RIGHT HAND SURGERY ON JULY 31ST 2019    Past Surgical History:  Procedure Laterality Date  . HYSTEROSCOPY W/D&C N/A 05/20/2018   Procedure: DILATATION AND CURETTAGE /HYSTEROSCOPY;  Surgeon: [name], MD;  Location: [facility] ORS;  Service: Gynecology;  Laterality: N/A;  . INTRAUTERINE DEVICE (IUD) INSERTION N/A 05/20/2018   Procedure: INTRAUTERINE DEVICE (IUD) INSERTION;  Surgeon: [name], MD;  Location: [facility] ORS;  Service: Gynecology;  Laterality: N/A;  . WISDOM TOOTH EXTRACTION      Prior to Admission medications   Medication Sig Start Date End Date Taking? Authorizing Provider  amLODipine (NORVASC) 2.5 MG tablet Take 1 tablet (2.5 mg total) by mouth daily. 06/16/19  Yes [name], [name], MD  CRANBERRY PO Take by mouth daily.   Yes [provider]  diclofenac sodium (VOLTAREN) 1 % GEL Apply 2 g topically 4 (four) times daily. 12/02/18  Yes [name], [name], MD  docusate sodium (COLACE) 100 MG capsule Take 100 mg by mouth daily.   Yes [provider]  ferrous sulfate 325 (65 FE) MG tablet Take 325 mg by mouth daily with breakfast.   Yes [provider]  hydrochlorothiazide (HYDRODIURIL) 25 MG tablet 1 tab daily by mouth 03/24/19  Yes [name], [name], MD  hydrocortisone 2.5 % cream  Apply topically 2 (two) times daily. As needed neck, face 12/01/18  Yes [name], [name], MD  ketoconazole (NIZORAL) 2 % cream  04/20/19  Yes [provider]  metoprolol succinate (TOPROL-XL) 25 MG 24 hr tablet Take 1 tablet (25 mg total) by mouth daily. 12/02/18  Yes [name], [name], MD  Multiple Vitamins-Minerals (MULTIVITAMIN PO) Take 1 packet by mouth See admin instructions. [name] Supplement Multivitamin Packet - Take 6 tablets by mouth in the morning and take 7 tablets by mouth at bedtime   Yes [provider]  naproxen sodium (ALEVE) 220 MG tablet Take 220 mg by mouth daily as needed.   Yes [provider]  triamcinolone cream (KENALOG) 0.1 % Apply 1 application topically 2 (two) times daily. 12/01/18  Yes [name], [name], MD  venlafaxine XR ([name]) 37.5 MG 24 hr capsule Take 1 capsule (37.5 mg total) by mouth daily. 03/24/19  Yes [name], [name], MD  VITAMIN D, ERGOCALCIFEROL, PO Take 5,000 Units by mouth daily.   Yes [provider]  blood glucose meter kit and supplies Dispense based on patient and insurance preference. Use up to four times daily as directed. (FOR ICD-10 E10.9, E11.9). 05/03/19   [name], [name], MD    Allergies as of 07/08/2019 - Review Complete 07/08/2019  Allergen Reaction Noted  . Latex Itching 05/13/2018    Family History  Problem Relation Age of Onset  . Arthritis Mother   .  Hyperlipidemia Mother   . Hypertension Mother   . Diabetes Mellitus I Father        DM I  . Breast cancer Maternal Grandmother 32  . Arthritis Paternal Grandmother   . Diabetes Mellitus II Paternal Grandmother   . Arthritis Paternal Grandfather   . Hyperlipidemia Paternal Grandfather   . Stroke Paternal Grandfather   . Hypertension Paternal Grandfather   . Diabetes Mellitus II Sister   . Diabetes Mellitus II Sister   . Hyperlipidemia Sister   . Hypertension Sister     Social History   Socioeconomic  History  . Marital status: Married    Spouse name: Not on file  . Number of children: 2  . Years of education: Not on file  . Highest education level: Not on file  Occupational History  . Occupation: Therapist, sports  Social Needs  . Financial resource strain: Not on file  . Food insecurity    Worry: Not on file    Inability: Not on file  . Transportation needs    Medical: Not on file    Non-medical: Not on file  Tobacco Use  . Smoking status: Never Smoker  . Smokeless tobacco: Never Used  Substance and Sexual Activity  . Alcohol use: Yes    Alcohol/week: 2.0 standard drinks    Types: 2 Glasses of wine per week    Comment: OCC-WEEKENDS  . Drug use: No  . Sexual activity: Yes    Partners: Male    Birth control/protection: Surgical    Comment: vasectomy  Lifestyle  . Physical activity    Days per week: 0 days    Minutes per session: 0 min  . Stress: Not at all  Relationships  . Social Herbalist on phone: Not on file    Gets together: Not on file    Attends religious service: Not on file    Active member of club or organization: Not on file    Attends meetings of clubs or organizations: Not on file    Relationship status: Not on file  . Intimate partner violence    Fear of current or ex partner: Not on file    Emotionally abused: Not on file    Physically abused: Not on file    Forced sexual activity: Not on file  Other Topics Concern  . Not on file  Social History Narrative   Married    2 kids    Works occup. [employer]     Review of Systems: See HPI, otherwise negative ROS  Physical Exam: Ht '5\' 6"'  (1.676 m)   Wt 111.1 kg   BMI 39.54 kg/m  General:   Alert,  pleasant and cooperative in NAD Head:  Normocephalic and atraumatic. Neck:  Supple; no masses or thyromegaly. Lungs:  Clear throughout to auscultation.    Heart:  Regular rate and rhythm. Abdomen:  Soft, nontender and nondistended. Normal bowel sounds, without guarding, and without rebound.    Neurologic:  Alert and  oriented x4;  grossly normal neurologically.  Impression/Plan: LOVETTA CONDIE is now here to undergo a screening colonoscopy.  Risks, benefits, and alternatives regarding colonoscopy have been reviewed with the patient.  Questions have been answered.  All parties agreeable.

## 2019-07-18 NOTE — Transfer of Care (Signed)
Immediate Anesthesia Transfer of Care Note  Patient: Kathleen Burke  Procedure(s) Performed: COLONOSCOPY WITH PROPOFOL (N/A )  Patient Location: PACU  Anesthesia Type: General  Level of Consciousness: awake, alert  and patient cooperative  Airway and Oxygen Therapy: Patient Spontanous Breathing and Patient connected to supplemental oxygen  Post-op Assessment: Post-op Vital signs reviewed, Patient's Cardiovascular Status Stable, Respiratory Function Stable, Patent Airway and No signs of Nausea or vomiting  Post-op Vital Signs: Reviewed and stable  Complications: No apparent anesthesia complications

## 2019-07-18 NOTE — Anesthesia Procedure Notes (Signed)
Date/Time: 07/18/2019 8:44 AM Performed by: Cameron Ali, CRNA Pre-anesthesia Checklist: Patient identified, Emergency Drugs available, Suction available, Timeout performed and Patient being monitored Patient Re-evaluated:Patient Re-evaluated prior to induction Oxygen Delivery Method: Nasal cannula Placement Confirmation: positive ETCO2

## 2019-07-18 NOTE — Anesthesia Postprocedure Evaluation (Signed)
Anesthesia Post Note  Patient: Kathleen Burke  Procedure(s) Performed: COLONOSCOPY WITH PROPOFOL (N/A )  Patient location during evaluation: PACU Anesthesia Type: General Level of consciousness: awake and alert Pain management: pain level controlled Vital Signs Assessment: post-procedure vital signs reviewed and stable Respiratory status: spontaneous breathing, nonlabored ventilation, respiratory function stable and patient connected to nasal cannula oxygen Cardiovascular status: blood pressure returned to baseline and stable Postop Assessment: no apparent nausea or vomiting Anesthetic complications: no    [name]

## 2019-07-18 NOTE — Op Note (Signed)
[facility] Patient Name: Jaiyana Canale Procedure Date: 07/18/2019 8:41 AM MRN: [id number] Account #: [id number] Date of Birth: May 29, 1976 Admit Type: Outpatient Age: 43 Room: [facility] OR ROOM 01 Gender: Female Note Status: Finalized Procedure:            Colonoscopy Indications:          Screening for colorectal malignant neoplasm Providers:            Midge Minium MD, MD Referring MD:         Pasty Spillers [name] MD, MD (Referring MD) Medicines:            Propofol per Anesthesia Complications:        No immediate complications. Procedure:            Pre-Anesthesia Assessment:                       - Prior to the procedure, a History and Physical was                        performed, and patient medications and allergies were                        reviewed. The patient's tolerance of previous                        anesthesia was also reviewed. The risks and benefits of                        the procedure and the sedation options and risks were                        discussed with the patient. All questions were                        answered, and informed consent was obtained. Prior                        Anticoagulants: The patient has taken no previous                        anticoagulant or antiplatelet agents. ASA Grade                        Assessment: II - A patient with mild systemic disease.                        After reviewing the risks and benefits, the patient was                        deemed in satisfactory condition to undergo the                        procedure.                       After obtaining informed consent, the colonoscope was                        passed under direct vision. Throughout the procedure,  the patient's blood pressure, pulse, and oxygen                        saturations were monitored continuously. The was                        introduced through the anus and advanced to the  the                        cecum, identified by appendiceal orifice and ileocecal                        valve. The colonoscopy was performed without                        difficulty. The patient tolerated the procedure well.                        The quality of the bowel preparation was excellent. Findings:      The perianal and digital rectal examinations were normal.      The colon (entire examined portion) appeared normal.      The terminal ileum appeared normal. Impression:           - The entire examined colon is normal.                       - The examined portion of the ileum was normal.                       - No specimens collected. Recommendation:       - Discharge patient to home.                       - Resume previous diet.                       - Continue present medications. Procedure Code(s):    --- Professional ---                       [phone], Colonoscopy, flexible; diagnostic, including                        collection of specimen(s) by brushing or washing, when                        performed (separate procedure) Diagnosis Code(s):    --- Professional ---                       Z12.11, Encounter for screening for malignant neoplasm                        of colon CPT copyright 2019 American Medical Association. All rights reserved. The codes documented in this report are preliminary and upon coder review may  be revised to meet current compliance requirements. [name] MD, MD 07/18/2019 9:05:03 AM This report has been signed electronically. Number of Addenda: 0 Note Initiated On: 07/18/2019 8:41 AM Scope Withdrawal Time: 0 hours 11 minutes 7 seconds  Total Procedure Duration: 0 hours 14 minutes 42 seconds  Estimated Blood Loss: Estimated blood loss: none.  [facility]

## 2019-07-19 ENCOUNTER — Encounter: Payer: Self-pay | Admitting: Gastroenterology

## 2019-07-27 ENCOUNTER — Other Ambulatory Visit: Payer: Self-pay

## 2019-07-27 ENCOUNTER — Encounter: Payer: Self-pay | Admitting: Neurology

## 2019-07-27 ENCOUNTER — Ambulatory Visit: Payer: BC Managed Care – PPO | Admitting: Neurology

## 2019-07-27 VITALS — BP 154/102 | HR 99 | Temp 96.8°F | Ht 66.0 in | Wt 245.0 lb

## 2019-07-27 DIAGNOSIS — R7303 Prediabetes: Secondary | ICD-10-CM | POA: Diagnosis not present

## 2019-07-27 DIAGNOSIS — R200 Anesthesia of skin: Secondary | ICD-10-CM | POA: Diagnosis not present

## 2019-07-27 DIAGNOSIS — R202 Paresthesia of skin: Secondary | ICD-10-CM | POA: Diagnosis not present

## 2019-07-27 DIAGNOSIS — E669 Obesity, unspecified: Secondary | ICD-10-CM

## 2019-07-27 NOTE — Patient Instructions (Addendum)
You may have a condition called peripheral neuropathy, i. e. nerve damage. Unfortunately, there is no specific treatment available for most neuropathies.  Her findings are mild, exam is in fact normal today.  Nevertheless, prediabetes and obesity can predispose people for neuropathy. The most common cause for neuropathy is diabetes in this country, in which case, tight glucose control is key. You are working on prediabetes control and weight reduction.  Please continue with your follow-up with your primary care physician.  You have had appropriate blood work recently. I will add a Few more tests today.  Your B12 level was on the lower end of the spectrum, certainly worth monitoring with your primary care physician.  Other causes include thyroid disease, and some vitamin deficiencies. Certain medications such as chemotherapy agents and other chemicals or toxins including alcohol can cause neuropathy. There are some genetic conditions or hereditary neuropathies. Typically patients will report a family history of neuropathy in those conditions. There are cases associated with cancers and autoimmune conditions. Most neuropathies are progressive unless a root cause can be found and treated. For most neuropathies there is no actual cure or reversing of symptoms. Painful neuropathy can be difficult to treat symptomatically, but there are some medications available to ease the symptoms. Electrophysiologic testing with nerve conduction velocity studies and EMG (muscle testing) do not always pick up neuropathies that affect the smallest fibers. Other common tests include different type of blood work, and rarely, spinal fluid testing, and sometimes we resort to asking for a nerve and muscle biopsy.  As discussed, we will proceed with blood work today and schedule your for an EMG and nerve conduction velocity test, which is an electrical nerve and muscle test, which we will schedule. We will call you with the results and  take it from there.

## 2019-07-27 NOTE — Progress Notes (Signed)
Subjective:    Patient ID: Kathleen Burke is a 43 y.o. female.  HPI    Star Age, MD, PhD [facility] [address], [address] [place], [place] [address]  Dear Dr. Terese Door,   I saw your patient, Kathleen Burke, upon your kind request in my neurologic clinic today for initial consultation of her bilateral upper extremity numbness and tingling.  The patient is unaccompanied today.  As you know, [name] is a 43 year old right-handed woman with an underlying medical history of pre-diabetes, hypertension, reflux disease, depression, anxiety, anemia, arthritis, obstructive sleep apnea and obesity, who reports numbness and tingling affecting both upper extremities for the past 6+ months, less than 1 year, mildly progressive, started with the right hand in the fingertips.  She has primarily symptoms in her fingertips, not so much the entire hand, left hand is less affected than right, no symptoms in her feet, no radiating pain from the shoulders or neck area.  She has not fallen.  She feels weak sometimes and stiff in her hands especially first thing in the morning and symptoms seem to be worse first thing in the morning, not so much at night, not telltale for carpal tunnel symptoms.  I reviewed your tele medicine note from 06/16/2019.  She has had blood work recently in August, A1c came down from 6.5-6.3.  She has been prediabetic for years.  She has not been on medication for diabetes.  She has been working on weight loss.  She is on CPAP at night.  She has no hereditary neuropathy in her family history but her dad has diabetes and has associated neuropathy.  She works as a Marine scientist.  She lives with her family, including husband and 2 children.  She is not on anything for diabetes, is working on weight loss and diet control.  I reviewed her blood work, she had TSH and B12, B12 was on the lower end of the spectrum.  She had a mildly elevated lipid profile.  She is a  non-smoker, drinks alcohol occasionally on the weekends, caffeine occasionally.  Her Past Medical History Is Significant For: Past Medical History:  Diagnosis Date  . Anemia   . Anxiety 2014  . Arthritis    right arm, right knee  . Chicken pox   . Depression   . Frequent headaches   . GERD (gastroesophageal reflux disease)   . Hyperlipemia   . Hypertension   . Obesity (BMI 30-39.9)   . Pre-diabetes   . Sleep apnea    CPAP  . Trigger finger    MIDDLE FINGER RIGHT HAND SURGERY ON JULY 31ST 2019    Her Past Surgical History Is Significant For: Past Surgical History:  Procedure Laterality Date  . COLONOSCOPY WITH PROPOFOL N/A 07/18/2019   Procedure: COLONOSCOPY WITH PROPOFOL;  Surgeon: [name], MD;  Location: [facility];  Service: Endoscopy;  Laterality: N/A;  Latex Sleep apnea  . HYSTEROSCOPY W/D&C N/A 05/20/2018   Procedure: DILATATION AND CURETTAGE /HYSTEROSCOPY;  Surgeon: [name], MD;  Location: [facility];  Service: Gynecology;  Laterality: N/A;  . INTRAUTERINE DEVICE (IUD) INSERTION N/A 05/20/2018   Procedure: INTRAUTERINE DEVICE (IUD) INSERTION;  Surgeon: [name], MD;  Location: [facility];  Service: Gynecology;  Laterality: N/A;  . WISDOM TOOTH EXTRACTION      Her Family History Is Significant For: Family History  Problem Relation Age of Onset  . Arthritis Mother   . Hyperlipidemia Mother   . Hypertension Mother   .  Diabetes Mellitus I Father        DM I  . Breast cancer Maternal Grandmother 70  . Arthritis Paternal Grandmother   . Diabetes Mellitus II Paternal Grandmother   . Arthritis Paternal Grandfather   . Hyperlipidemia Paternal Grandfather   . Stroke Paternal Grandfather   . Hypertension Paternal Grandfather   . Diabetes Mellitus II Sister   . Diabetes Mellitus II Sister   . Hyperlipidemia Sister   . Hypertension Sister     Her Social History Is Significant For: Social History   Socioeconomic History  . Marital status:  Married    Spouse name: Not on file  . Number of children: 2  . Years of education: Not on file  . Highest education level: Not on file  Occupational History  . Occupation: Therapist, sports  Social Needs  . Financial resource strain: Not on file  . Food insecurity    Worry: Not on file    Inability: Not on file  . Transportation needs    Medical: Not on file    Non-medical: Not on file  Tobacco Use  . Smoking status: Never Smoker  . Smokeless tobacco: Never Used  Substance and Sexual Activity  . Alcohol use: Yes    Alcohol/week: 2.0 standard drinks    Types: 2 Glasses of wine per week    Comment: OCC-WEEKENDS  . Drug use: No  . Sexual activity: Yes    Partners: Male    Birth control/protection: Surgical    Comment: vasectomy  Lifestyle  . Physical activity    Days per week: 0 days    Minutes per session: 0 min  . Stress: Not at all  Relationships  . Social Herbalist on phone: Not on file    Gets together: Not on file    Attends religious service: Not on file    Active member of club or organization: Not on file    Attends meetings of clubs or organizations: Not on file    Relationship status: Not on file  Other Topics Concern  . Not on file  Social History Narrative   Married    2 kids    Works occup. [employer]     Her Allergies Are:  Allergies  Allergen Reactions  . Latex Itching    GLOVES (extended exposure)  :   Her Current Medications Are:  Outpatient Encounter Medications as of 07/27/2019  Medication Sig  . amLODipine (NORVASC) 2.5 MG tablet Take 1 tablet (2.5 mg total) by mouth daily.  . blood glucose meter kit and supplies Dispense based on patient and insurance preference. Use up to four times daily as directed. (FOR ICD-10 E10.9, E11.9).  Marland Kitchen CRANBERRY PO Take by mouth daily.  . diclofenac sodium (VOLTAREN) 1 % GEL Apply 2 g topically 4 (four) times daily.  Marland Kitchen docusate sodium (COLACE) 100 MG capsule Take 100 mg by mouth daily.  . ferrous sulfate 325  (65 FE) MG tablet Take 325 mg by mouth daily with breakfast.  . hydrochlorothiazide (HYDRODIURIL) 25 MG tablet 1 tab daily by mouth  . hydrocortisone 2.5 % cream Apply topically 2 (two) times daily. As needed neck, face  . ketoconazole (NIZORAL) 2 % cream   . metoprolol succinate (TOPROL-XL) 25 MG 24 hr tablet Take 1 tablet (25 mg total) by mouth daily.  . Multiple Vitamins-Minerals (MULTIVITAMIN PO) Take 1 packet by mouth See admin instructions. [name] Supplement Multivitamin Packet - Take 6 tablets by mouth in the  morning and take 7 tablets by mouth at bedtime  . naproxen sodium (ALEVE) 220 MG tablet Take 220 mg by mouth daily as needed.  . triamcinolone cream (KENALOG) 0.1 % Apply 1 application topically 2 (two) times daily.  Marland Kitchen venlafaxine XR ([name]) 37.5 MG 24 hr capsule Take 1 capsule (37.5 mg total) by mouth daily.  Marland Kitchen VITAMIN D, ERGOCALCIFEROL, PO Take 5,000 Units by mouth daily.   No facility-administered encounter medications on file as of 07/27/2019.   :   Review of Systems:  Out of a complete 14 point review of systems, all are reviewed and negative with the exception of these symptoms as listed below:  Review of Systems  Respiratory:       Snoring  Cardiovascular: Positive for leg swelling.  Musculoskeletal:       Joint Pain Aching muscles  Neurological: Positive for numbness.  Hematological:       Anemia     Objective:  Neurological Exam  Physical Exam Physical Examination:   Vitals:   07/27/19 1413  BP: (!) 154/102  Pulse: 99  Temp: (!) 96.8 F (36 C)    General Examination: The patient is a very pleasant 43 y.o. female in no acute distress. She appears well-developed and well-nourished and well groomed.   HEENT: Normocephalic, atraumatic, pupils are equal, round and reactive to light and accommodation. Corrective eyeglasses in place.  Extraocular tracking is well preserved in all directions, no nystagmus.  Hearing is grossly intact.  Face is symmetric  with normal facial animation and normal facial sensation. Speech is clear with no dysarthria noted. There is no hypophonia. There is no lip, neck/head, jaw or voice tremor. Neck is supple with full range of passive and active motion. There are no carotid bruits on auscultation. Oropharynx exam reveals: Benign findings, tongue protrudes centrally in palate elevates symmetrically.   Chest: Clear to auscultation without wheezing, rhonchi or crackles noted.  Heart: S1+S2+0, regular and normal without murmurs, rubs or gallops noted.   Abdomen: Soft, non-tender and non-distended with normal bowel sounds appreciated on auscultation.  Extremities: There is no pitting edema in the distal lower extremities bilaterally. Pedal pulses are intact.  Skin: Warm and dry without trophic changes noted.  Musculoskeletal: exam reveals no obvious joint deformities, tenderness or joint swelling or erythema.   Neurologically:  Mental status: The patient is awake, alert and oriented in all 4 spheres. Her immediate and remote memory, attention, language skills and fund of knowledge are appropriate. There is no evidence of aphasia, agnosia, apraxia or anomia. Speech is clear with normal prosody and enunciation. Thought process is linear. Mood is normal and affect is normal.  Cranial nerves II - XII are as described above under HEENT exam. In addition: shoulder shrug is normal with equal shoulder height noted. Motor exam: Normal bulk, strength and tone is noted. There is no drift, tremor or rebound. Romberg is negative. Reflexes are 2+ throughout. Babinski: Toes are flexor bilaterally. Fine motor skills and coordination: intact with normal finger taps, normal hand movements, normal rapid alternating patting, normal foot taps and normal foot agility.  Cerebellar testing: No dysmetria or intention tremor on finger to nose testing. Heel to shin is unremarkable bilaterally. There is no truncal or gait ataxia.  Sensory exam:  intact to light touch, pinprick, vibration, temperature sense in the upper and lower extremities. Negative Phalen's, negative Tinel's b/l.  No thenar atrophy.  Gait, station and balance: She stands easily. No veering to one side is noted. No  leaning to one side is noted. Posture is age-appropriate and stance is narrow based. Gait shows normal stride length and normal pace. No problems turning are noted. Tandem walk is unremarkable.             Assessment and Plan:   Assessment and Plan:  In summary, DEIRDRA HEUMANN is a very pleasant 43 y.o.-year old female with an underlying medical history of pre-diabetes, hypertension, reflux disease, depression, anxiety, anemia, arthritis, obstructive sleep apnea and obesity, who Presents for evaluation of her numbness and tingling affecting primarily her fingertips.  Sometimes she has a pins and needle type sensation which comes and goes and sometimes she feels weak in her hands.  Her neurological exam reveals no obvious abnormalities, good strength, no obvious sensory deficits, no focal atrophy.  History and findings are not classic for carpal tunnel and symptoms are not in keeping with radiculopathy.  She has been prediabetic and obese for years.  She has had recent blood work.  I would like to order a little more blood work today including inflammatory markers and autoimmune markers.  We talked about the possibility of underlying neuropathy.  She is encouraged to continue to work on diet modification and weight loss.I would like to proceed with EMG and nerve conduction testing.  We will call her with her blood work results and EMG results and take it from there.Of note, she did tried to use a over-the-counter splint which she felt was marginally helpful. May be there is a component of median neuropathy.  We will keep her posted as to her test results and follow-up if needed.  I answered all her questions today and she was in agreement.  Thank you very much for  allowing me to participate in the care of this nice patient. If I can be of any further assistance to you please do not hesitate to call me at [phone].  Sincerely,   Star Age, MD, PhD

## 2019-07-28 ENCOUNTER — Telehealth: Payer: Self-pay | Admitting: Neurology

## 2019-07-28 ENCOUNTER — Encounter: Payer: BC Managed Care – PPO | Admitting: Diagnostic Neuroimaging

## 2019-07-28 NOTE — Telephone Encounter (Signed)
I called patient and LVM to schedule NCV/EMG per Dr. Rexene Alberts.

## 2019-08-01 NOTE — Progress Notes (Signed)
Lab results are fine with the exception of a slight increase in her sedimentation rate which is a nonspecific finding and can be higher in inflammation or infection such as urinary tract infection or Arthritis for example.  Her vitamin B6 level is pending, we will update as necessary, will call back if abnormal. Proceed with EMG/NCV. Michel Bickers

## 2019-08-02 ENCOUNTER — Telehealth: Payer: Self-pay

## 2019-08-02 NOTE — Telephone Encounter (Signed)
I did not have any immediate treatment options for her which is correct but I did encourage her to proceed with additional testing in order to see if there is a treatable cause of her symptoms.  Thank you for talking to her and encouraging her to proceed with testing as recommended.

## 2019-08-02 NOTE — Telephone Encounter (Signed)
-----   Message from Star Age, MD sent at 08/01/2019  5:25 PM EDT ----- Lab results are fine with the exception of a slight increase in her sedimentation rate which is a nonspecific finding and can be higher in inflammation or infection such as urinary tract infection or Arthritis for example.  Her vitamin B6 level is pending, we will update as necessary, will call back if abnormal. Proceed with EMG/NCV. Michel Bickers

## 2019-08-02 NOTE — Telephone Encounter (Signed)
Pt returned my call and I discussed her lab results and recommendations with her. Pt reports that she does not want to proceed with the EMG/NCV because it is painful and since Dr. Rexene Alberts told her that she doesn't have any treatments for her she doesn't feel the test is necessary. She will call us back if she changes her mind. Pt verbalized understanding of results. Pt had no questions at this time but was encouraged to call back if questions arise.

## 2019-08-02 NOTE — Telephone Encounter (Signed)
I called pt to discuss her results. No answer, left a message asking her to call me back. 

## 2019-08-04 LAB — SEDIMENTATION RATE: Sed Rate: 38 mm/hr — ABNORMAL HIGH (ref 0–32)

## 2019-08-04 LAB — VITAMIN B1: Thiamine: 127.8 nmol/L (ref 66.5–200.0)

## 2019-08-04 LAB — RPR: RPR Ser Ql: NONREACTIVE

## 2019-08-04 LAB — RHEUMATOID FACTOR: Rheumatoid fact SerPl-aCnc: <10 IU/mL (ref 0.0–13.9)

## 2019-08-04 LAB — ANA W/REFLEX: Anti Nuclear Antibody (ANA): NEGATIVE

## 2019-08-04 LAB — VITAMIN B6: Vitamin B6: 5.6 ug/L (ref 2.0–32.8)

## 2019-08-20 IMAGING — MG MM DIGITAL SCREENING BILAT W/ TOMO W/ CAD
6 of 10 series · 6 of 30 positions shown · non-contrast
Comparison: Previous exam(s).

CLINICAL DATA: Screening.

EXAM:
DIGITAL SCREENING BILATERAL MAMMOGRAM WITH TOMO AND CAD

[R MLO synth-2D]
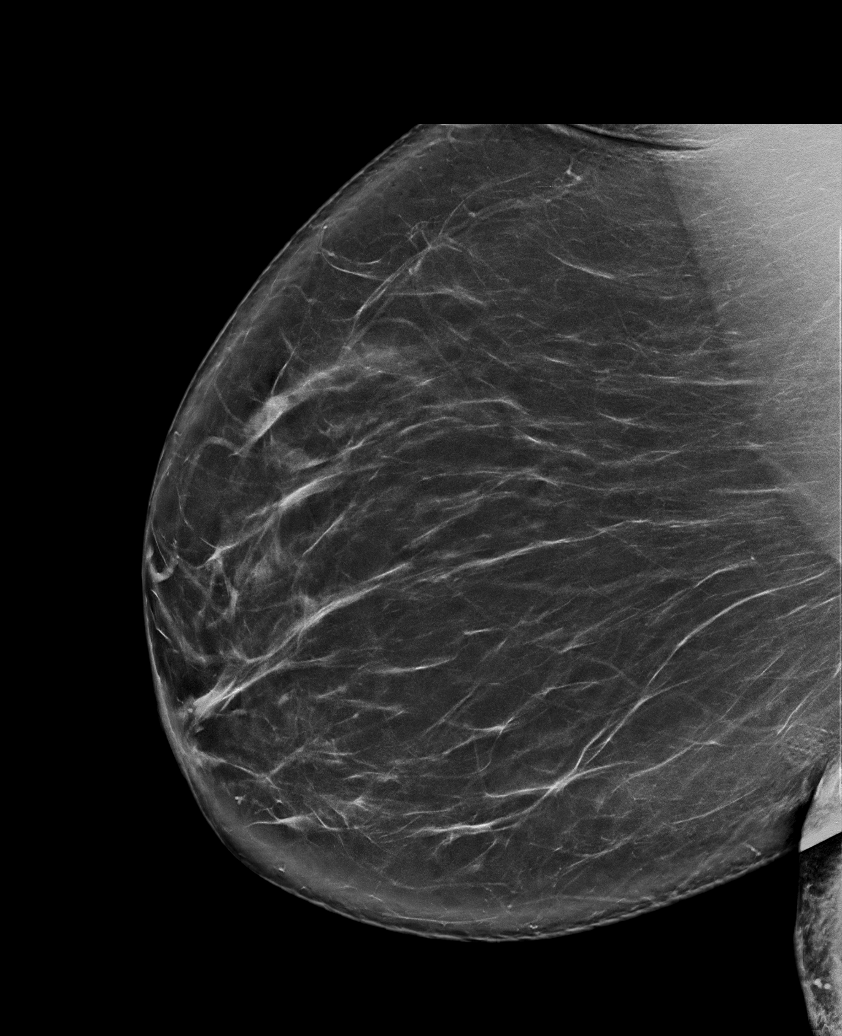

[L MLO synth-2D]
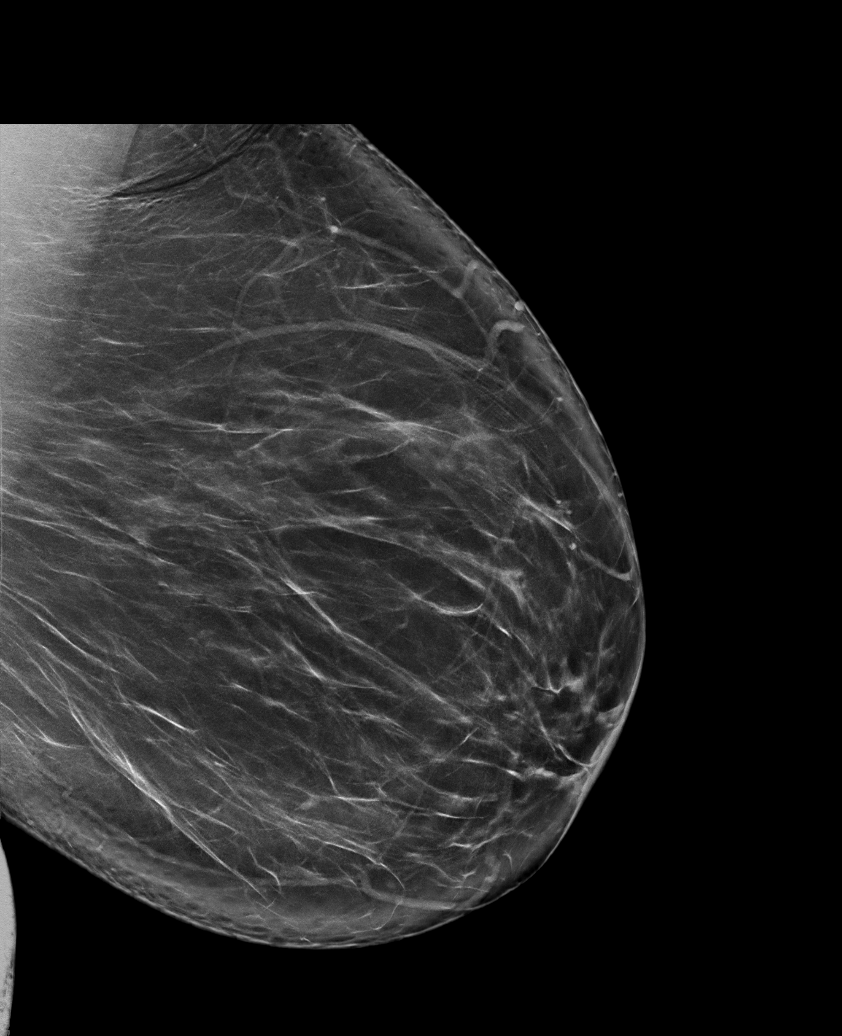

[R CC synth-2D]
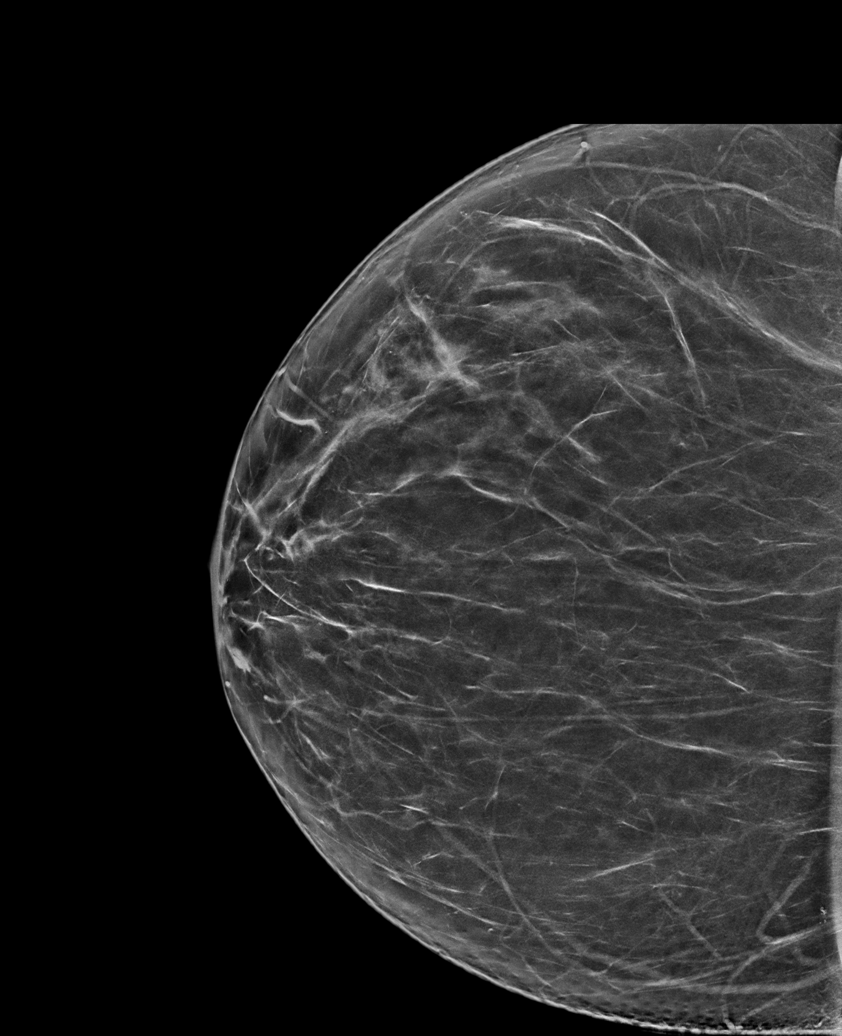

[R CV synth-2D]
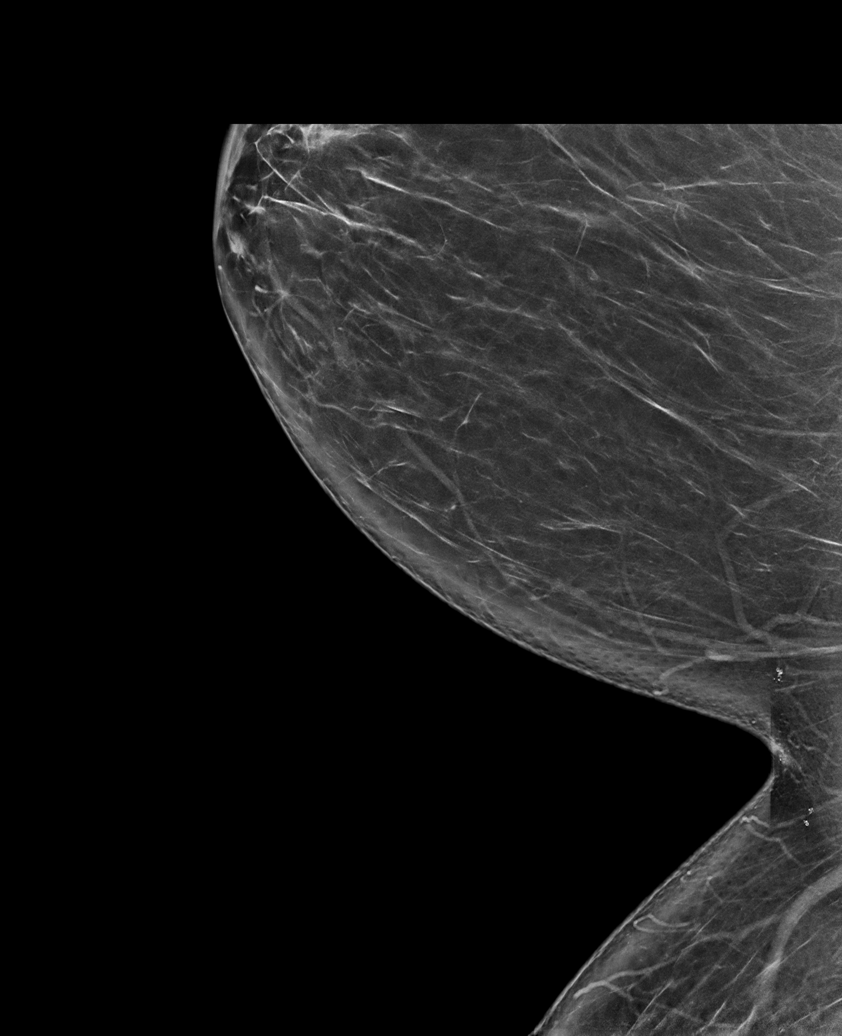

[L CC synth-2D]
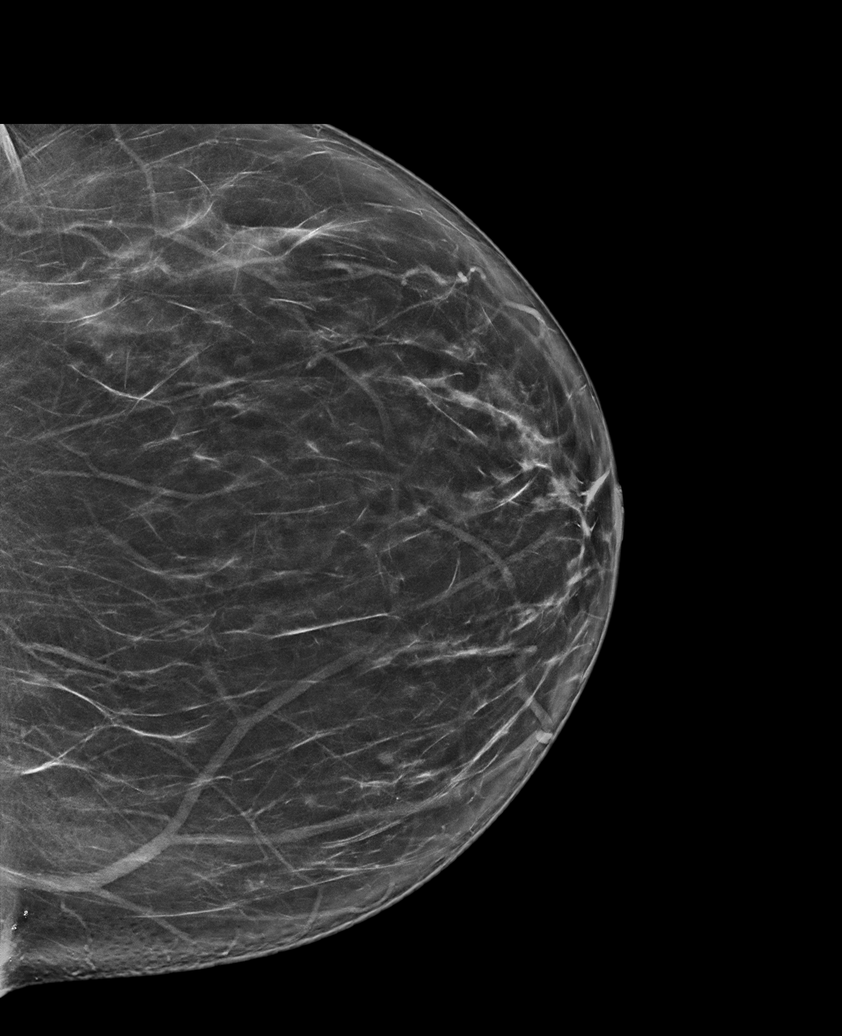

[R MLO tomo · tomo slice 49/98.0]
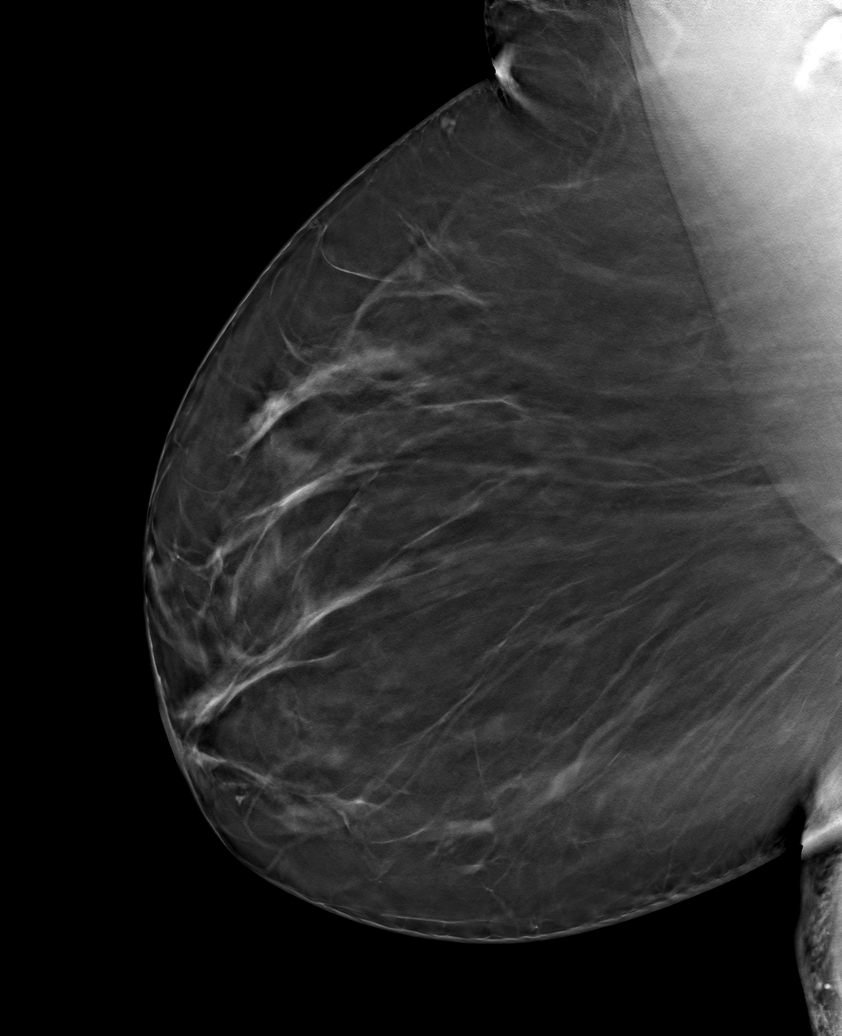

[6 of 30 positions shown; findings below may reference images not displayed]

ACR Breast Density Category b: There are scattered areas of
fibroglandular density.
FINDINGS: There are no findings suspicious for malignancy. Images were
processed with CAD.
IMPRESSION: No mammographic evidence of malignancy. A result letter of this
screening mammogram will be mailed directly to the patient.

RECOMMENDATION:
Screening mammogram in one year. (Code:[id number])

BI-RADS CATEGORY  1: Negative.

## 2019-09-15 ENCOUNTER — Other Ambulatory Visit: Payer: Self-pay

## 2019-09-19 ENCOUNTER — Other Ambulatory Visit (INDEPENDENT_AMBULATORY_CARE_PROVIDER_SITE_OTHER): Payer: BC Managed Care – PPO

## 2019-09-19 ENCOUNTER — Other Ambulatory Visit: Payer: Self-pay

## 2019-09-19 DIAGNOSIS — E119 Type 2 diabetes mellitus without complications: Secondary | ICD-10-CM

## 2019-09-19 DIAGNOSIS — E538 Deficiency of other specified B group vitamins: Secondary | ICD-10-CM

## 2019-09-19 DIAGNOSIS — I10 Essential (primary) hypertension: Secondary | ICD-10-CM | POA: Diagnosis not present

## 2019-09-19 LAB — COMPREHENSIVE METABOLIC PANEL
ALT: 25 U/L (ref 0–35)
AST: 24 U/L (ref 0–37)
Albumin: 4.3 g/dL (ref 3.5–5.2)
Alkaline Phosphatase: 86 U/L (ref 39–117)
BUN: 7 mg/dL (ref 6–23)
CO2: 30 mEq/L (ref 19–32)
Calcium: 9.3 mg/dL (ref 8.4–10.5)
Chloride: 99 mEq/L (ref 96–112)
Creatinine, Ser: 0.59 mg/dL (ref 0.40–1.20)
GFR: 133.09 mL/min (ref >60.00)
Glucose, Bld: 99 mg/dL (ref 70–99)
Potassium: 3.6 mEq/L (ref 3.5–5.1)
Sodium: 138 mEq/L (ref 135–145)
Total Bilirubin: 0.5 mg/dL (ref 0.2–1.2)
Total Protein: 7.4 g/dL (ref 6.0–8.3)

## 2019-09-19 LAB — LIPID PANEL
Cholesterol: 186 mg/dL (ref 0–200)
HDL: 64.2 mg/dL (ref >39.00)
LDL Cholesterol: 111 mg/dL — ABNORMAL HIGH (ref 0–99)
NonHDL: 121.79
Total CHOL/HDL Ratio: 3
Triglycerides: 52 mg/dL (ref 0.0–149.0)
VLDL: 10.4 mg/dL (ref 0.0–40.0)

## 2019-09-19 LAB — HEMOGLOBIN A1C: Hgb A1c MFr Bld: 6.5 % (ref 4.6–6.5)

## 2019-09-19 LAB — CBC WITH DIFFERENTIAL/PLATELET
Basophils Absolute: 0 10*3/uL (ref 0.0–0.1)
Basophils Relative: 0.5 % (ref 0.0–3.0)
Eosinophils Absolute: 0.2 10*3/uL (ref 0.0–0.7)
Eosinophils Relative: 2.6 % (ref 0.0–5.0)
HCT: 35.2 % — ABNORMAL LOW (ref 36.0–46.0)
Hemoglobin: 11.6 g/dL — ABNORMAL LOW (ref 12.0–15.0)
Lymphocytes Relative: 29.9 % (ref 12.0–46.0)
Lymphs Abs: 1.8 10*3/uL (ref 0.7–4.0)
MCHC: 32.8 g/dL (ref 30.0–36.0)
MCV: 87.2 fl (ref 78.0–100.0)
Monocytes Absolute: 0.4 10*3/uL (ref 0.1–1.0)
Monocytes Relative: 6.5 % (ref 3.0–12.0)
Neutro Abs: 3.7 10*3/uL (ref 1.4–7.7)
Neutrophils Relative %: 60.5 % (ref 43.0–77.0)
Platelets: 344 10*3/uL (ref 150.0–400.0)
RBC: 4.04 Mil/uL (ref 3.87–5.11)
RDW: 14.6 % (ref 11.5–15.5)
WBC: 6.1 10*3/uL (ref 4.0–10.5)

## 2019-09-19 LAB — MICROALBUMIN / CREATININE URINE RATIO
Creatinine,U: 31.1 mg/dL
Microalb Creat Ratio: 7.1 mg/g (ref 0.0–30.0)
Microalb, Ur: 2.2 mg/dL — ABNORMAL HIGH (ref 0.0–1.9)

## 2019-09-20 LAB — VITAMIN B12: Vitamin B-12: 659 pg/mL (ref 232–1245)

## 2019-09-20 LAB — SPECIMEN STATUS REPORT

## 2019-09-21 ENCOUNTER — Other Ambulatory Visit: Payer: BC Managed Care – PPO

## 2019-09-28 ENCOUNTER — Encounter: Payer: Self-pay | Admitting: Internal Medicine

## 2019-09-28 ENCOUNTER — Ambulatory Visit (INDEPENDENT_AMBULATORY_CARE_PROVIDER_SITE_OTHER): Payer: BC Managed Care – PPO | Admitting: Internal Medicine

## 2019-09-28 ENCOUNTER — Other Ambulatory Visit: Payer: Self-pay

## 2019-09-28 VITALS — BP 125/85 | HR 90 | Wt 243.9 lb

## 2019-09-28 DIAGNOSIS — R0609 Other forms of dyspnea: Secondary | ICD-10-CM

## 2019-09-28 DIAGNOSIS — E119 Type 2 diabetes mellitus without complications: Secondary | ICD-10-CM | POA: Insufficient documentation

## 2019-09-28 DIAGNOSIS — F329 Major depressive disorder, single episode, unspecified: Secondary | ICD-10-CM

## 2019-09-28 DIAGNOSIS — R0781 Pleurodynia: Secondary | ICD-10-CM | POA: Diagnosis not present

## 2019-09-28 DIAGNOSIS — F419 Anxiety disorder, unspecified: Secondary | ICD-10-CM | POA: Diagnosis not present

## 2019-09-28 DIAGNOSIS — I10 Essential (primary) hypertension: Secondary | ICD-10-CM | POA: Diagnosis not present

## 2019-09-28 DIAGNOSIS — G5603 Carpal tunnel syndrome, bilateral upper limbs: Secondary | ICD-10-CM

## 2019-09-28 DIAGNOSIS — R0600 Dyspnea, unspecified: Secondary | ICD-10-CM | POA: Diagnosis not present

## 2019-09-28 DIAGNOSIS — G8929 Other chronic pain: Secondary | ICD-10-CM

## 2019-09-28 DIAGNOSIS — M545 Low back pain, unspecified: Secondary | ICD-10-CM | POA: Insufficient documentation

## 2019-09-28 DIAGNOSIS — R519 Headache, unspecified: Secondary | ICD-10-CM

## 2019-09-28 MED ORDER — OLMESARTAN MEDOXOMIL 20 MG PO TABS: 20.0000 mg | ORAL_TABLET | Freq: Every day | ORAL | 3 refills | Status: DC

## 2019-09-28 NOTE — Progress Notes (Addendum)
Virtual Visit via Video Note  I connected with Charm Rings  on 09/28/19 at  8:30 AM EST by a video enabled telemedicine application and verified that I am speaking with the correct person using two identifiers.  Location patient: home Location provider:work or home office Persons participating in the virtual visit: patient, provider  I discussed the limitations of evaluation and management by telemedicine and the availability of in person appointments. The patient expressed understanding and agreed to proceed.   HPI: 1. BP elevated 132/87 in am befor emeds 125/85 HR 90 today norvasc 2.5 mg qd and hctz 25 mg qd  2. DM 2 A1C 6.5 09/19/19 she saw nutritionist 1st 1 x. On sundays she does eat dessert and has wine occasionally  3. Hand tingling/numbness b/l improved  4. Sob with exertion and chest pain at times with deep breathing checked d dimer, pro bnp and trop today normal no etiology no h/o asthma  5. HTN BP elevated at times on hctz 25 mg qd and norvasc 2.5 mg qd but hands feel tight and hard to move c/w edema hands and legs will stop and change medication  6. C/o left lower back pain no radiations intermittently mild to moderate nothing worse or better 7. Rash resolved neck after taking B complex with B12 per pt and finger tingling stopped  8. Mood and hot flashes better on effexor 37.5 and she does not want to stop 37.5 mg qd for now she denies side effects and thinks this is helping  9. C/o daily frontal pressure at least x 4 months 3/10 nagging pain nothing worse its there all the time cpap settings just increased per pt and she is compliant with CPAP, BP intermittently elevated on current meds . Denies dizziness   ROS: See pertinent positives and negatives per HPI.  Past Medical History:  Diagnosis Date  . Anemia   . Anxiety 2014  . Arthritis    right arm, right knee  . Chicken pox   . Depression   . Frequent headaches   . GERD (gastroesophageal reflux disease)   .  Hyperlipemia   . Hypertension   . Obesity (BMI 30-39.9)   . Pre-diabetes   . Sleep apnea    CPAP  . Trigger finger    MIDDLE FINGER RIGHT HAND SURGERY ON JULY 31ST 2019    Past Surgical History:  Procedure Laterality Date  . COLONOSCOPY WITH PROPOFOL N/A 07/18/2019   Procedure: COLONOSCOPY WITH PROPOFOL;  Surgeon: [name], MD;  Location: [facility];  Service: Endoscopy;  Laterality: N/A;  Latex Sleep apnea  . HYSTEROSCOPY W/D&C N/A 05/20/2018   Procedure: DILATATION AND CURETTAGE /HYSTEROSCOPY;  Surgeon: [name], MD;  Location: [facility];  Service: Gynecology;  Laterality: N/A;  . INTRAUTERINE DEVICE (IUD) INSERTION N/A 05/20/2018   Procedure: INTRAUTERINE DEVICE (IUD) INSERTION;  Surgeon: [name], MD;  Location: [facility];  Service: Gynecology;  Laterality: N/A;  . WISDOM TOOTH EXTRACTION      Family History  Problem Relation Age of Onset  . Arthritis Mother   . Hyperlipidemia Mother   . Hypertension Mother   . Diabetes Mellitus I Father        DM I  . Breast cancer Maternal Grandmother 39  . Arthritis Paternal Grandmother   . Diabetes Mellitus II Paternal Grandmother   . Arthritis Paternal Grandfather   . Hyperlipidemia Paternal Grandfather   . Stroke Paternal Grandfather   . Hypertension Paternal Grandfather   . Diabetes Mellitus II  Sister   . Diabetes Mellitus II Sister   . Hyperlipidemia Sister   . Hypertension Sister     SOCIAL HX:  Married  2 kids  Works occup[employer]    Current Outpatient Medications:  .  ACCU-CHEK GUIDE test strip, , Disp: , Rfl:  .  blood glucose meter kit and supplies, Dispense based on patient and insurance preference. Use up to four times daily as directed. (FOR ICD-10 E10.9, E11.9)., Disp: 1 each, Rfl: 0 .  CRANBERRY PO, Take by mouth daily., Disp: , Rfl:  .  Cyanocobalamin (B-12) 1000 MCG TABS, , Disp: , Rfl:  .  diclofenac sodium (VOLTAREN) 1 % GEL, Apply 2 g topically 4 (four) times daily., Disp:  100 g, Rfl: 11 .  docusate sodium (COLACE) 100 MG capsule, Take 100 mg by mouth daily., Disp: , Rfl:  .  ELDERBERRY PO, Take by mouth 2 (two) times daily. Gummies, Disp: , Rfl:  .  ferrous sulfate 325 (65 FE) MG tablet, Take 325 mg by mouth daily with breakfast., Disp: , Rfl:  .  hydrochlorothiazide (HYDRODIURIL) 25 MG tablet, 1 tab daily by mouth, Disp: 90 tablet, Rfl: 3 .  hydrocortisone 2.5 % cream, Apply topically 2 (two) times daily. As needed neck, face, Disp: 60 g, Rfl: 0 .  ketoconazole (NIZORAL) 2 % cream, , Disp: , Rfl:  .  metoprolol succinate (TOPROL-XL) 25 MG 24 hr tablet, Take 1 tablet (25 mg total) by mouth daily., Disp: 90 tablet, Rfl: 3 .  Multiple Vitamins-Minerals (MULTIVITAMIN PO), Take 1 packet by mouth See admin instructions. [name] Supplement Multivitamin Packet - Take 6 tablets by mouth in the morning and take 7 tablets by mouth at bedtime, Disp: , Rfl:  .  naproxen sodium (ALEVE) 220 MG tablet, Take 220 mg by mouth daily as needed., Disp: , Rfl:  .  SUPER B COMPLEX/C PO, , Disp: , Rfl:  .  triamcinolone cream (KENALOG) 0.1 %, Apply 1 application topically 2 (two) times daily., Disp: 80 g, Rfl: 0 .  venlafaxine XR ([name]) 37.5 MG 24 hr capsule, Take 1 capsule (37.5 mg total) by mouth daily., Disp: 90 capsule, Rfl: 3 .  VITAMIN D, ERGOCALCIFEROL, PO, Take 5,000 Units by mouth daily., Disp: , Rfl:  .  olmesartan (BENICAR) 20 MG tablet, Take 1 tablet (20 mg total) by mouth daily., Disp: 90 tablet, Rfl: 3  EXAM:  VITALS per patient if applicable:  GENERAL: alert, oriented, appears well and in no acute distress  HEENT: atraumatic, conjunttiva clear, no obvious abnormalities on inspection of external nose and ears  NECK: normal movements of the head and neck  LUNGS: on inspection no signs of respiratory distress, breathing rate appears normal, no obvious gross SOB, gasping or wheezing  CV: no obvious cyanosis  MS: moves all visible extremities without noticeable  abnormality  PSYCH/NEURO: pleasant and cooperative, no obvious depression or anxiety, speech and thought processing grossly intact  ASSESSMENT AND PLAN:  Discussed the following assessment and plan:  Essential hypertension - Plan: olmesartan (BENICAR) 20 MG tablet qd to hctz 25 mg qd  Stop norvasc 2.5 mg qd due to possibly hand and leg swelling for now   Dyspnea on exertion - Plan: D-Dimer, Quantitative, Pro b natriuretic peptide, Troponin I Pleuritic chest pain - Plan: D-Dimer, Quantitative, Pro b natriuretic peptide, Troponin I Labs normal likely not PE, cardiac etiology  Could be deconditioning  Pt does not have asthma  If continues consider pulmonary vs cards w/u in  future   Anxiety and depression Cont effexor 37.5 mg qd for now denies adverse effects   Acute left-sided low back pain, unspecified whether sciatica present Consider Xray L spine Back stretches  Nonintractable headache, unspecified chronicity pattern, unspecified headache type -MRI brain -disc with lung MD about CPAP ? If new settings ok   Prediabetes now DM 2 A1C 6.5  Healthy diet and exercise saw nutrition 1 x  Given info to change lifestyle for now  B/l CTS  Moderate to severe right mild on left per EMG/NCS 12/2019  Referred [facility]   HM Will get flu at work utd tdapdue 11/26/2019 Immune hep B and MMR  Pap [facility] 03/23/18 neg no HPV testing done Endometrial Bx 05/20/18 negative with IUD now Has IUD   mammo8/21/20 negative  Declines HIV testing  rec take D3 2000 IUD daily   -we discussed possible serious and likely etiologies, options for evaluation and workup, limitations of telemedicine visit vs in person visit, treatment, treatment risks and precautions. Pt prefers to treat via telemedicine empirically rather then risking or undertaking an in person visit at this moment. Patient agrees to seek prompt in person care if worsening, new symptoms arise, or if is not improving  with treatment.   I discussed the assessment and treatment plan with the patient. The patient was provided an opportunity to ask questions and all were answered. The patient agreed with the plan and demonstrated an understanding of the instructions.   The patient was advised to call back or seek an in-person evaluation if the symptoms worsen or if the condition fails to improve as anticipated.  Time spent 25 minutes  Delorise Jackson, MD

## 2019-09-29 LAB — PRO B NATRIURETIC PEPTIDE: NT-Pro BNP: 6 pg/mL (ref 0–249)

## 2019-09-29 LAB — TROPONIN I: Troponin I: <0.01 ng/mL (ref 0.00–0.04)

## 2019-09-29 LAB — D-DIMER, QUANTITATIVE: D-DIMER: 0.23 mg/L FEU (ref 0.00–0.49)

## 2019-10-05 ENCOUNTER — Encounter: Payer: Self-pay | Admitting: Internal Medicine

## 2019-10-05 NOTE — Patient Instructions (Addendum)
Left me know if you want an Xray of your lower back for back pain please  Someone will call to schedule MRI of your brain   Budget-Friendly Healthy Eating There are many ways to save money at the grocery store and continue to eat healthy. You can be successful if you:  Plan meals according to your budget.  Make a grocery list and only purchase food according to your grocery list.  Prepare food yourself. What are tips for following this plan?  Reading food labels  Compare food labels between brand name foods and the store brand. Often the nutritional value is the same, but the store brand is lower cost.  Look for products that do not have added sugar, fat, or salt (sodium). These often cost the same but are healthier for you. Products may be labeled as: ? Sugar-free. ? Nonfat. ? Low-fat. ? Sodium-free. ? Low-sodium.  Look for lean ground beef labeled as at least 92% lean and 8% fat. Shopping  Buy only the items on your grocery list and go only to the areas of the store that have the items on your list.  Use coupons only for foods and brands you normally buy. Avoid buying items you wouldn't normally buy simply because they are on sale.  Check online and in newspapers for weekly deals.  Buy healthy items from the bulk bins when available, such as herbs, spices, flour, pasta, nuts, and dried fruit.  Buy fruits and vegetables that are in season. Prices are usually lower on in-season produce.  Look at the unit price on the price tag. Use it to compare different brands and sizes to find out which item is the best deal.  Choose healthy items that are often low-cost, such as carrots, potatoes, apples, bananas, and oranges. Dried or canned beans are a low-cost protein source.  Buy in bulk and freeze extra food. Items you can buy in bulk include meats, fish, poultry, frozen fruits, and frozen vegetables.  Avoid buying "ready-to-eat" foods, such as pre-cut fruits and vegetables and  pre-made salads.  If possible, shop around to discover where you can find the best prices. Consider other retailers such as dollar stores, larger [employer], local fruit and vegetable stands, and farmers markets.  Do not shop when you are hungry. If you shop while hungry, it may be hard to stick to your list and budget.  Resist impulse buying. Use your grocery list as your official plan for the week.  Buy a variety of vegetables and fruits by purchasing fresh, frozen, and canned items.  Look at the top and bottom shelves for deals. Foods at eye level (eye level of an adult or child) are usually more expensive.  Be efficient with your time when shopping. The more time you spend at the store, the more money you are likely to spend.  To save money when choosing more expensive foods like meats and dairy: ? Choose cheaper cuts of meat, such as bone-in chicken thighs and drumsticks instead of skinless and boneless chicken. When you are ready to prepare the chicken, you can remove the skin yourself to make it healthier. ? Choose lean meats like chicken or [place] instead of beef. ? Choose canned seafood, such as tuna, salmon, or sardines. ? Buy eggs as a low-cost source of protein. ? Buy dried beans and peas, such as lentils, split peas, or kidney beans instead of meats. Dried beans and peas are a good alternative source of protein. ? Buy the  larger tubs of yogurt instead of individual-sized containers.  Choose water instead of sodas and other sweetened beverages.  Avoid buying chips, cookies, and other "junk food." These items are usually expensive and not healthy. Cooking  Make extra food and freeze the extras in meal-sized containers or in individual portions for fast meals and snacks.  Pre-cook on days when you have extra time to prepare meals in advance. You can keep these meals in the fridge or freezer and reheat for a quick meal.  When you come home from the grocery store, wash,  peel, and cut fruits and vegetables so they are ready to use and eat. This will help reduce food waste. Meal planning  Do not eat out or get fast food. Prepare food at home.  Make a grocery list and make sure to bring it with you to the store. If you have a smart phone, you could use your phone to create your shopping list.  Plan meals and snacks according to a grocery list and budget you create.  Use leftovers in your meal plan for the week.  Look for recipes where you can cook once and make enough food for two meals.  Include budget-friendly meals like stews, casseroles, and stir-fry dishes.  Try some meatless meals or try "no cook" meals like salads.  Make sure that half your plate is filled with fruits or vegetables. Choose from fresh, frozen, or canned fruits and vegetables. If eating canned, remember to rinse them before eating. This will remove any excess salt added for packaging. Summary  Eating healthy on a budget is possible if you plan your meals according to your budget, purchase according to your budget and grocery list, and prepare food yourself.  Tips for buying more food on a limited budget include buying generic brands, using coupons only for foods you normally buy, and buying healthy items from the bulk bins when available.  Tips for buying cheaper food to replace expensive food include choosing cheaper, lean cuts of meat, and buying dried beans and peas. This information is not intended to replace advice given to you by your health care provider. Make sure you discuss any questions you have with your health care provider. Document Released: 06/16/2014 Document Revised: 10/14/2017 Document Reviewed: 10/14/2017 Elsevier Patient Education  2020 [place] Following a healthy eating pattern may help you to achieve and maintain a healthy body weight, reduce the risk of chronic disease, and live a long and productive life. It is important to follow a  healthy eating pattern at an appropriate calorie level for your body. Your nutritional needs should be met primarily through food by choosing a variety of nutrient-rich foods. What are tips for following this plan? Reading food labels  Read labels and choose the following: ? Reduced or low sodium. ? Juices with 100% fruit juice. ? Foods with low saturated fats and high polyunsaturated and monounsaturated fats. ? Foods with whole grains, such as whole wheat, cracked wheat, brown rice, and wild rice. ? Whole grains that are fortified with folic acid. This is recommended for women who are pregnant or who want to become pregnant.  Read labels and avoid the following: ? Foods with a lot of added sugars. These include foods that contain brown sugar, corn sweetener, corn syrup, dextrose, fructose, glucose, high-fructose corn syrup, honey, invert sugar, lactose, malt syrup, maltose, molasses, raw sugar, sucrose, trehalose, or turbinado sugar.  Do not eat more than the following amounts of added sugar  per day:  6 teaspoons (25 g) for women.  9 teaspoons (38 g) for men. ? Foods that contain processed or refined starches and grains. ? Refined grain products, such as white flour, degermed cornmeal, white bread, and white rice. Shopping  Choose nutrient-rich snacks, such as vegetables, whole fruits, and nuts. Avoid high-calorie and high-sugar snacks, such as potato chips, fruit snacks, and candy.  Use oil-based dressings and spreads on foods instead of solid fats such as butter, stick margarine, or cream cheese.  Limit pre-made sauces, mixes, and "instant" products such as flavored rice, instant noodles, and ready-made pasta.  Try more plant-protein sources, such as tofu, tempeh, black beans, edamame, lentils, nuts, and seeds.  Explore eating plans such as the Mediterranean diet or vegetarian diet. Cooking  Use oil to saut or stir-fry foods instead of solid fats such as butter, stick margarine,  or lard.  Try baking, boiling, grilling, or broiling instead of frying.  Remove the fatty part of meats before cooking.  Steam vegetables in water or broth. Meal planning   At meals, imagine dividing your plate into fourths: ? One-half of your plate is fruits and vegetables. ? One-fourth of your plate is whole grains. ? One-fourth of your plate is protein, especially lean meats, poultry, eggs, tofu, beans, or nuts.  Include low-fat dairy as part of your daily diet. Lifestyle  Choose healthy options in all settings, including home, work, school, restaurants, or stores.  Prepare your food safely: ? Wash your hands after handling raw meats. ? Keep food preparation surfaces clean by regularly washing with hot, soapy water. ? Keep raw meats separate from ready-to-eat foods, such as fruits and vegetables. ? Cook seafood, meat, poultry, and eggs to the recommended internal temperature. ? Store foods at safe temperatures. In general:  Keep cold foods at 55F (4.4C) or below.  Keep hot foods at 155F (60C) or above.  Keep your freezer at [facility] (-17.8C) or below.  Foods are no longer safe to eat when they have been between the temperatures of 40-155F (4.4-60C) for more than 2 hours. What foods should I eat? Fruits Aim to eat 2 cup-equivalents of fresh, canned (in natural juice), or frozen fruits each day. Examples of 1 cup-equivalent of fruit include 1 small apple, 8 large strawberries, 1 cup canned fruit,  cup dried fruit, or 1 cup 100% juice. Vegetables Aim to eat 2-3 cup-equivalents of fresh and frozen vegetables each day, including different varieties and colors. Examples of 1 cup-equivalent of vegetables include 2 medium carrots, 2 cups raw, leafy greens, 1 cup chopped vegetable (raw or cooked), or 1 medium baked potato. Grains Aim to eat 6 ounce-equivalents of whole grains each day. Examples of 1 ounce-equivalent of grains include 1 slice of bread, 1 cup ready-to-eat cereal,  3 cups popcorn, or  cup cooked rice, pasta, or cereal. Meats and other proteins Aim to eat 5-6 ounce-equivalents of protein each day. Examples of 1 ounce-equivalent of protein include 1 egg, 1/2 cup nuts or seeds, or 1 tablespoon (16 g) peanut butter. A cut of meat or fish that is the size of a deck of cards is about 3-4 ounce-equivalents.  Of the protein you eat each week, try to have at least 8 ounces come from seafood. This includes salmon, trout, herring, and anchovies. Dairy Aim to eat 3 cup-equivalents of fat-free or low-fat dairy each day. Examples of 1 cup-equivalent of dairy include 1 cup (240 mL) milk, 8 ounces (250 g) yogurt, 1 ounces (44 g)  natural cheese, or 1 cup (240 mL) fortified soy milk. Fats and oils  Aim for about 5 teaspoons (21 g) per day. Choose monounsaturated fats, such as canola and olive oils, avocados, peanut butter, and most nuts, or polyunsaturated fats, such as sunflower, corn, and soybean oils, walnuts, pine nuts, sesame seeds, sunflower seeds, and flaxseed. Beverages  Aim for six 8-oz glasses of water per day. Limit coffee to three to five 8-oz cups per day.  Limit caffeinated beverages that have added calories, such as soda and energy drinks.  Limit alcohol intake to no more than 1 drink a day for nonpregnant women and 2 drinks a day for men. One drink equals 12 oz of beer (355 mL), 5 oz of wine (148 mL), or 1 oz of hard liquor (44 mL). Seasoning and other foods  Avoid adding excess amounts of salt to your foods. Try flavoring foods with herbs and spices instead of salt.  Avoid adding sugar to foods.  Try using oil-based dressings, sauces, and spreads instead of solid fats. This information is based on general [place] nutrition guidelines. For more information, visit [employer].es. Exact amounts may vary based on your nutrition needs. Summary  A healthy eating plan may help you to maintain a healthy weight, reduce the risk of chronic diseases, and stay  active throughout your life.  Plan your meals. Make sure you eat the right portions of a variety of nutrient-rich foods.  Try baking, boiling, grilling, or broiling instead of frying.  Choose healthy options in all settings, including home, work, school, restaurants, or stores. This information is not intended to replace advice given to you by your health care provider. Make sure you discuss any questions you have with your health care provider. Document Released: 01/25/2018 Document Revised: 01/25/2018 Document Reviewed: 01/25/2018 Elsevier Patient Education  2020 [employer].  Diabetes Mellitus and Exercise Exercising regularly is important for your overall health, especially when you have diabetes (diabetes mellitus). Exercising is not only about losing weight. It has many other health benefits, such as increasing muscle strength and bone density and reducing body fat and stress. This leads to improved fitness, flexibility, and endurance, all of which result in better overall health. Exercise has additional benefits for people with diabetes, including:  Reducing appetite.  Helping to lower and control blood glucose.  Lowering blood pressure.  Helping to control amounts of fatty substances (lipids) in the blood, such as cholesterol and triglycerides.  Helping the body to respond better to insulin (improving insulin sensitivity).  Reducing how much insulin the body needs.  Decreasing the risk for heart disease by: ? Lowering cholesterol and triglyceride levels. ? Increasing the levels of good cholesterol. ? Lowering blood glucose levels. What is my activity plan? Your health care provider or certified diabetes educator can help you make a plan for the type and frequency of exercise (activity plan) that works for you. Make sure that you:  Do at least 150 minutes of moderate-intensity or vigorous-intensity exercise each week. This could be brisk walking, biking, or water  aerobics. ? Do stretching and strength exercises, such as yoga or weightlifting, at least 2 times a week. ? Spread out your activity over at least 3 days of the week.  Get some form of physical activity every day. ? Do not go more than 2 days in a row without some kind of physical activity. ? Avoid being inactive for more than 30 minutes at a time. Take frequent breaks to walk or stretch.  Choose a type of exercise or activity that you enjoy, and set realistic goals.  Start slowly, and gradually increase the intensity of your exercise over time. What do I need to know about managing my diabetes?   Check your blood glucose before and after exercising. ? If your blood glucose is 240 mg/dL (13.3 mmol/L) or higher before you exercise, check your urine for ketones. If you have ketones in your urine, do not exercise until your blood glucose returns to normal. ? If your blood glucose is 100 mg/dL (5.6 mmol/L) or lower, eat a snack containing 15-20 grams of carbohydrate. Check your blood glucose 15 minutes after the snack to make sure that your level is above 100 mg/dL (5.6 mmol/L) before you start your exercise.  Know the symptoms of low blood glucose (hypoglycemia) and how to treat it. Your risk for hypoglycemia increases during and after exercise. Common symptoms of hypoglycemia can include: ? Hunger. ? Anxiety. ? Sweating and feeling clammy. ? Confusion. ? Dizziness or feeling light-headed. ? Increased heart rate or palpitations. ? Blurry vision. ? Tingling or numbness around the mouth, lips, or tongue. ? Tremors or shakes. ? Irritability.  Keep a rapid-acting carbohydrate snack available before, during, and after exercise to help prevent or treat hypoglycemia.  Avoid injecting insulin into areas of the body that are going to be exercised. For example, avoid injecting insulin into: ? The arms, when playing tennis. ? The legs, when jogging.  Keep records of your exercise habits. Doing  this can help you and your health care provider adjust your diabetes management plan as needed. Write down: ? Food that you eat before and after you exercise. ? Blood glucose levels before and after you exercise. ? The type and amount of exercise you have done. ? When your insulin is expected to peak, if you use insulin. Avoid exercising at times when your insulin is peaking.  When you start a new exercise or activity, work with your health care provider to make sure the activity is safe for you, and to adjust your insulin, medicines, or food intake as needed.  Drink plenty of water while you exercise to prevent dehydration or heat stroke. Drink enough fluid to keep your urine clear or pale yellow. Summary  Exercising regularly is important for your overall health, especially when you have diabetes (diabetes mellitus).  Exercising has many health benefits, such as increasing muscle strength and bone density and reducing body fat and stress.  Your health care provider or certified diabetes educator can help you make a plan for the type and frequency of exercise (activity plan) that works for you.  When you start a new exercise or activity, work with your health care provider to make sure the activity is safe for you, and to adjust your insulin, medicines, or food intake as needed. This information is not intended to replace advice given to you by your health care provider. Make sure you discuss any questions you have with your health care provider. Document Released: 01/03/2004 Document Revised: 05/07/2017 Document Reviewed: 03/24/2016 Elsevier Patient Education  [place].  Diabetes Mellitus and Nutrition, Adult When you have diabetes (diabetes mellitus), it is very important to have healthy eating habits because your blood sugar (glucose) levels are greatly affected by what you eat and drink. Eating healthy foods in the appropriate amounts, at about the same times every day, can help  you:  Control your blood glucose.  Lower your risk of heart disease.  Improve  your blood pressure.  Reach or maintain a healthy weight. Every person with diabetes is different, and each person has different needs for a meal plan. Your health care provider may recommend that you work with a diet and nutrition specialist (dietitian) to make a meal plan that is best for you. Your meal plan may vary depending on factors such as:  The calories you need.  The medicines you take.  Your weight.  Your blood glucose, blood pressure, and cholesterol levels.  Your activity level.  Other health conditions you have, such as heart or kidney disease. How do carbohydrates affect me? Carbohydrates, also called carbs, affect your blood glucose level more than any other type of food. Eating carbs naturally raises the amount of glucose in your blood. Carb counting is a method for keeping track of how many carbs you eat. Counting carbs is important to keep your blood glucose at a healthy level, especially if you use insulin or take certain oral diabetes medicines. It is important to know how many carbs you can safely have in each meal. This is different for every person. Your dietitian can help you calculate how many carbs you should have at each meal and for each snack. Foods that contain carbs include:  Bread, cereal, rice, pasta, and crackers.  Potatoes and corn.  Peas, beans, and lentils.  Milk and yogurt.  Fruit and juice.  Desserts, such as cakes, cookies, ice cream, and candy. How does alcohol affect me? Alcohol can cause a sudden decrease in blood glucose (hypoglycemia), especially if you use insulin or take certain oral diabetes medicines. Hypoglycemia can be a life-threatening condition. Symptoms of hypoglycemia (sleepiness, dizziness, and confusion) are similar to symptoms of having too much alcohol. If your health care provider says that alcohol is safe for you, follow these  guidelines:  Limit alcohol intake to no more than 1 drink per day for nonpregnant women and 2 drinks per day for men. One drink equals 12 oz of beer, 5 oz of wine, or 1 oz of hard liquor.  Do not drink on an empty stomach.  Keep yourself hydrated with water, diet soda, or unsweetened iced tea.  Keep in mind that regular soda, juice, and other mixers may contain a lot of sugar and must be counted as carbs. What are tips for following this plan?  Reading food labels  Start by checking the serving size on the "Nutrition Facts" label of packaged foods and drinks. The amount of calories, carbs, fats, and other nutrients listed on the label is based on one serving of the item. Many items contain more than one serving per package.  Check the total grams (g) of carbs in one serving. You can calculate the number of servings of carbs in one serving by dividing the total carbs by 15. For example, if a food has 30 g of total carbs, it would be equal to 2 servings of carbs.  Check the number of grams (g) of saturated and trans fats in one serving. Choose foods that have low or no amount of these fats.  Check the number of milligrams (mg) of salt (sodium) in one serving. Most people should limit total sodium intake to less than 2,300 mg per day.  Always check the nutrition information of foods labeled as "low-fat" or "nonfat". These foods may be higher in added sugar or refined carbs and should be avoided.  Talk to your dietitian to identify your daily goals for nutrients listed on the label.  Shopping  Avoid buying canned, premade, or processed foods. These foods tend to be high in fat, sodium, and added sugar.  Shop around the outside edge of the grocery store. This includes fresh fruits and vegetables, bulk grains, fresh meats, and fresh dairy. Cooking  Use low-heat cooking methods, such as baking, instead of high-heat cooking methods like deep frying.  Cook using healthy oils, such as olive,  canola, or sunflower oil.  Avoid cooking with butter, cream, or high-fat meats. Meal planning  Eat meals and snacks regularly, preferably at the same times every day. Avoid going long periods of time without eating.  Eat foods high in fiber, such as fresh fruits, vegetables, beans, and whole grains. Talk to your dietitian about how many servings of carbs you can eat at each meal.  Eat 4-6 ounces (oz) of lean protein each day, such as lean meat, chicken, fish, eggs, or tofu. One oz of lean protein is equal to: ? 1 oz of meat, chicken, or fish. ? 1 egg. ?  cup of tofu.  Eat some foods each day that contain healthy fats, such as avocado, nuts, seeds, and fish. Lifestyle  Check your blood glucose regularly.  Exercise regularly as told by your health care provider. This may include: ? 150 minutes of moderate-intensity or vigorous-intensity exercise each week. This could be brisk walking, biking, or water aerobics. ? Stretching and doing strength exercises, such as yoga or weightlifting, at least 2 times a week.  Take medicines as told by your health care provider.  Do not use any products that contain nicotine or tobacco, such as cigarettes and e-cigarettes. If you need help quitting, ask your health care provider.  Work with a Social worker or diabetes educator to identify strategies to manage stress and any emotional and social challenges. Questions to ask a health care provider  Do I need to meet with a diabetes educator?  Do I need to meet with a dietitian?  What number can I call if I have questions?  When are the best times to check my blood glucose? Where to find more information:  American Diabetes Association: diabetes.org  Academy of Nutrition and Dietetics: www.eatright.[employer] of Diabetes and Digestive and Kidney Diseases (NIH): [employer] Summary  A healthy meal plan will help you control your blood glucose and maintain a healthy  lifestyle.  Working with a diet and nutrition specialist (dietitian) can help you make a meal plan that is best for you.  Keep in mind that carbohydrates (carbs) and alcohol have immediate effects on your blood glucose levels. It is important to count carbs and to use alcohol carefully. This information is not intended to replace advice given to you by your health care provider. Make sure you discuss any questions you have with your health care provider. Document Released: 07/10/2005 Document Revised: 09/25/2017 Document Reviewed: 11/17/2016 Elsevier Patient Education  2020 [place].    Back Exercises The following exercises strengthen the muscles that help to support the trunk and back. They also help to keep the lower back flexible. Doing these exercises can help to prevent back pain or lessen existing pain.  If you have back pain or discomfort, try doing these exercises 2-3 times each day or as told by your health care provider.  As your pain improves, do them once each day, but increase the number of times that you repeat the steps for each exercise (do more repetitions).  To prevent the recurrence of back pain, continue to  do these exercises once each day or as told by your health care provider. Do exercises exactly as told by your health care provider and adjust them as directed. It is normal to feel mild stretching, pulling, tightness, or discomfort as you do these exercises, but you should stop right away if you feel sudden pain or your pain gets worse. Exercises Single knee to chest Repeat these steps 3-5 times for each leg: 1. Lie on your back on a firm bed or the floor with your legs extended. 2. Bring one knee to your chest. Your other leg should stay extended and in contact with the floor. 3. Hold your knee in place by grabbing your knee or thigh with both hands and hold. 4. Pull on your knee until you feel a gentle stretch in your lower back or buttocks. 5. Hold the  stretch for 10-30 seconds. 6. Slowly release and straighten your leg. Pelvic tilt Repeat these steps 5-10 times: 1. Lie on your back on a firm bed or the floor with your legs extended. 2. Bend your knees so they are pointing toward the ceiling and your feet are flat on the floor. 3. Tighten your lower abdominal muscles to press your lower back against the floor. This motion will tilt your pelvis so your tailbone points up toward the ceiling instead of pointing to your feet or the floor. 4. With gentle tension and even breathing, hold this position for 5-10 seconds. Cat-cow Repeat these steps until your lower back becomes more flexible: 1. Get into a hands-and-knees position on a firm surface. Keep your hands under your shoulders, and keep your knees under your hips. You may place padding under your knees for comfort. 2. Let your head hang down toward your chest. Contract your abdominal muscles and point your tailbone toward the floor so your lower back becomes rounded like the back of a cat. 3. Hold this position for 5 seconds. 4. Slowly lift your head, let your abdominal muscles relax and point your tailbone up toward the ceiling so your back forms a sagging arch like the back of a cow. 5. Hold this position for 5 seconds.  Press-ups Repeat these steps 5-10 times: 1. Lie on your abdomen (face-down) on the floor. 2. Place your palms near your head, about shoulder-width apart. 3. Keeping your back as relaxed as possible and keeping your hips on the floor, slowly straighten your arms to raise the top half of your body and lift your shoulders. Do not use your back muscles to raise your upper torso. You may adjust the placement of your hands to make yourself more comfortable. 4. Hold this position for 5 seconds while you keep your back relaxed. 5. Slowly return to lying flat on the floor.  Bridges Repeat these steps 10 times: 1. Lie on your back on a firm surface. 2. Bend your knees so they  are pointing toward the ceiling and your feet are flat on the floor. Your arms should be flat at your sides, next to your body. 3. Tighten your buttocks muscles and lift your buttocks off the floor until your waist is at almost the same height as your knees. You should feel the muscles working in your buttocks and the back of your thighs. If you do not feel these muscles, slide your feet 1-2 inches farther away from your buttocks. 4. Hold this position for 3-5 seconds. 5. Slowly lower your hips to the starting position, and allow your buttocks muscles to relax  completely. If this exercise is too easy, try doing it with your arms crossed over your chest. Abdominal crunches Repeat these steps 5-10 times: 1. Lie on your back on a firm bed or the floor with your legs extended. 2. Bend your knees so they are pointing toward the ceiling and your feet are flat on the floor. 3. Cross your arms over your chest. 4. Tip your chin slightly toward your chest without bending your neck. 5. Tighten your abdominal muscles and slowly raise your trunk (torso) high enough to lift your shoulder blades a tiny bit off the floor. Avoid raising your torso higher than that because it can put too much stress on your low back and does not help to strengthen your abdominal muscles. 6. Slowly return to your starting position. Back lifts Repeat these steps 5-10 times: 1. Lie on your abdomen (face-down) with your arms at your sides, and rest your forehead on the floor. 2. Tighten the muscles in your legs and your buttocks. 3. Slowly lift your chest off the floor while you keep your hips pressed to the floor. Keep the back of your head in line with the curve in your back. Your eyes should be looking at the floor. 4. Hold this position for 3-5 seconds. 5. Slowly return to your starting position. Contact a health care provider if:  Your back pain or discomfort gets much worse when you do an exercise.  Your worsening back pain  or discomfort does not lessen within 2 hours after you exercise. If you have any of these problems, stop doing these exercises right away. Do not do them again unless your health care provider says that you can. Get help right away if:  You develop sudden, severe back pain. If this happens, stop doing the exercises right away. Do not do them again unless your health care provider says that you can. This information is not intended to replace advice given to you by your health care provider. Make sure you discuss any questions you have with your health care provider. Document Released: 11/20/2004 Document Revised: 02/17/2019 Document Reviewed: 07/15/2018 Elsevier Patient Education  Belle Headache Without Cause A headache is pain or discomfort felt around the head or neck area. The specific cause of a headache may not be found. There are many causes and types of headaches. A few common ones are:  Tension headaches.  Migraine headaches.  Cluster headaches.  Chronic daily headaches. Follow these instructions at home: Watch your condition for any changes. Let your health care provider know about them. Take these steps to help with your condition: Managing pain      Take over-the-counter and prescription medicines only as told by your health care provider.  Lie down in a dark, quiet room when you have a headache.  If directed, put ice on your head and neck area: ? Put ice in a plastic bag. ? Place a towel between your skin and the bag. ? Leave the ice on for 20 minutes, 2-3 times per day.  If directed, apply heat to the affected area. Use the heat source that your health care provider recommends, such as a moist heat pack or a heating pad. ? Place a towel between your skin and the heat source. ? Leave the heat on for 20-30 minutes. ? Remove the heat if your skin turns bright red. This is especially important if you are unable to feel pain, heat, or cold. You may  have a  greater risk of getting burned.  Keep lights dim if bright lights bother you or make your headaches worse. Eating and drinking  Eat meals on a regular schedule.  If you drink alcohol: ? Limit how much you use to:  0-1 drink a day for women.  0-2 drinks a day for men. ? Be aware of how much alcohol is in your drink. In the [place] one drink equals one 12 oz bottle of beer (355 mL), one 5 oz glass of wine (148 mL), or one 1 oz glass of hard liquor (44 mL).  Stop drinking caffeine, or decrease the amount of caffeine you drink. General instructions   Keep a headache journal to help find out what may trigger your headaches. For example, write down: ? What you eat and drink. ? How much sleep you get. ? Any change to your diet or medicines.  Try massage or other relaxation techniques.  Limit stress.  Sit up straight, and do not tense your muscles.  Do not use any products that contain nicotine or tobacco, such as cigarettes, e-cigarettes, and chewing tobacco. If you need help quitting, ask your health care provider.  Exercise regularly as told by your health care provider.  Sleep on a regular schedule. Get 7-9 hours of sleep each night, or the amount recommended by your health care provider.  Keep all follow-up visits as told by your health care provider. This is important. Contact a health care provider if:  Your symptoms are not helped by medicine.  You have a headache that is different from the usual headache.  You have nausea or you vomit.  You have a fever. Get help right away if:  Your headache becomes severe quickly.  Your headache gets worse after moderate to intense physical activity.  You have repeated vomiting.  You have a stiff neck.  You have a loss of vision.  You have problems with speech.  You have pain in the eye or ear.  You have muscular weakness or loss of muscle control.  You lose your balance or have trouble walking.  You feel  faint or pass out.  You have confusion.  You have a seizure. Summary  A headache is pain or discomfort felt around the head or neck area.  There are many causes and types of headaches. In some cases, the cause may not be found.  Keep a headache journal to help find out what may trigger your headaches. Watch your condition for any changes. Let your health care provider know about them.  Contact a health care provider if you have a headache that is different from the usual headache, or if your symptoms are not helped by medicine.  Get help right away if your headache becomes severe, you vomit, you have a loss of vision, you lose your balance, or you have a seizure. This information is not intended to replace advice given to you by your health care provider. Make sure you discuss any questions you have with your health care provider. Document Released: 10/13/2005 Document Revised: 05/03/2018 Document Reviewed: 05/03/2018 Elsevier Patient Education  2020 [employer].

## 2019-10-06 ENCOUNTER — Other Ambulatory Visit: Payer: Self-pay | Admitting: Internal Medicine

## 2019-10-06 ENCOUNTER — Encounter: Payer: Self-pay | Admitting: Internal Medicine

## 2019-10-06 ENCOUNTER — Ambulatory Visit
Admission: RE | Admit: 2019-10-06 | Discharge: 2019-10-06 | Disposition: A | Discharge status: Home / Self Care | Payer: BC Managed Care – PPO | Source: Ambulatory Visit | Attending: Internal Medicine | Admitting: Internal Medicine

## 2019-10-06 ENCOUNTER — Other Ambulatory Visit: Payer: Self-pay

## 2019-10-06 DIAGNOSIS — G8929 Other chronic pain: Secondary | ICD-10-CM

## 2019-10-06 DIAGNOSIS — M545 Low back pain, unspecified: Secondary | ICD-10-CM

## 2019-10-07 ENCOUNTER — Encounter: Payer: Self-pay | Admitting: Internal Medicine

## 2019-10-10 NOTE — Progress Notes (Signed)
Kathleen Burke, thank you however I do not handle sleep apnea as a general rule but we will get her set up with  Dr. Halford Chessman f who is coming from [place] to do sleep clinic and is more of an expert on this than I am.  Will copy to [facility] so that she can be scheduled.

## 2019-10-11 ENCOUNTER — Telehealth: Payer: Self-pay

## 2019-10-11 NOTE — Telephone Encounter (Signed)
-----   Message from Tyler Pita, MD sent at 10/10/2019 12:16 PM EST -----   ----- Message ----- From: [name], [name], MD Sent: 10/05/2019   7:01 PM EST To: Tyler Pita, MD  Hi This pt was pt of Dr. Juanell Fairly on CPAP with just adjustment in settings having daily headache x months Do you think this could be related to CPAP changes? If so can you try to get this patient appt with you please nice patient I am doing an MRI [employer]

## 2019-10-11 NOTE — Telephone Encounter (Signed)
Pt has been scheduled for 10/18/2019 with Dr. Halford Chessman. Nothing further is needed.

## 2019-10-18 ENCOUNTER — Ambulatory Visit (INDEPENDENT_AMBULATORY_CARE_PROVIDER_SITE_OTHER): Payer: BC Managed Care – PPO | Admitting: Pulmonary Disease

## 2019-10-18 ENCOUNTER — Encounter: Payer: Self-pay | Admitting: Pulmonary Disease

## 2019-10-18 DIAGNOSIS — G4733 Obstructive sleep apnea (adult) (pediatric): Secondary | ICD-10-CM | POA: Diagnosis not present

## 2019-10-18 DIAGNOSIS — Z9989 Dependence on other enabling machines and devices: Secondary | ICD-10-CM | POA: Diagnosis not present

## 2019-10-18 DIAGNOSIS — R519 Headache, unspecified: Secondary | ICD-10-CM

## 2019-10-18 DIAGNOSIS — R0609 Other forms of dyspnea: Secondary | ICD-10-CM

## 2019-10-18 DIAGNOSIS — R0600 Dyspnea, unspecified: Secondary | ICD-10-CM

## 2019-10-18 NOTE — Patient Instructions (Signed)
Make sure you use your CPAP whenever you are sleeping  Follow up in 1 year

## 2019-10-18 NOTE — Progress Notes (Signed)
[facility]  Chief Complaint  Patient presents with  . Follow-up    Patient is having headaches in morning possibly from CPAP.     Constitutional:  There were no vitals taken for this visit.  Deferred.  Past Medical History:  Anxiety, OA, Depression, HA, GERD, HLD, HTN  Brief Summary:  Kathleen Burke is a 43 y.o. female with obstructive sleep apnea.  Virtual Visit via Telephone Note  I connected with JANASHA BARKALOW on 10/18/19 at 12:00 PM EST by telephone and verified that I am speaking with the correct person using two identifiers.  Location: Patient: home Provider: medical office   I discussed the limitations, risks, security and privacy concerns of performing an evaluation and management service by telephone and the availability of in person appointments. I also discussed with the patient that there may be a patient responsible charge related to this service. The patient expressed understanding and agreed to proceed.  She has been getting more frequent headaches.  PCP was concerned whether there might be an issue with her sleep apnea therapy.  She was scheduled for appointment.  She uses CPAP nightly.  Not issues with mask fit.  Feels pressure is okay.  Doesn't usually have sinus congestion.  Denies dry mouth, sore throat, or aerophagia.  She uses machine about 5.5 hours per night.  She sleeps longer than this.  She usually falls asleep on the couch for couple of hours w/o using CPAP.  She has noticed feeling short of breath and having chest discomfort if she walks up stairs or has to lift/push heavy objects.  She walks on a daily basis, and doesn't have trouble when walking on level ground.  No history of smoking.  No history of asthma.  Not having cough, wheeze, or sputum.  Lab work from 09/19/19 showed CO2 30, Hb 11.6.  Lab work from 09/28/19 showed normal D dimer and BNP.  Physical Exam:  Deferred.  Assessment/Plan:   Obstructive  sleep apnea. - she is compliant with CPAP and reports benefit - continue auto CPAP range 9 to 20 cm H2O - advised her to use CPAP for entire time she is asleep to get maximal benefit  Dyspnea on exertion. - advised her to f/u with PCP to determine if she needs further cardiac assessment  Headaches. - explained how under-treated sleep apnea could contribute to headaches due to poor quality sleep - will reassess after she has improved usage of CPAP   Patient Instructions  Make sure you use your CPAP whenever you are sleeping  Follow up in 1 year      I discussed the assessment and treatment plan with the patient. The patient was provided an opportunity to ask questions and all were answered. The patient agreed with the plan and demonstrated an understanding of the instructions.   The patient was advised to call back or seek an in-person evaluation if the symptoms worsen or if the condition fails to improve as anticipated.  I provided 23 minutes of non-face-to-face time during this encounter.   Chesley Mires, MD [place] Pager: [phone] 10/18/2019, 12:05 PM  Flow Sheet      Sleep tests:  HST 04/11/19 >> AHI 16, SpO2 low 84% Auto CPAP 09/18/19 to 10/16/09 >> used on 30 of 30 nights with average 5 hrs 33 min.  Average AHI 0.3 with median CPAP 11 and 95 th percentile CPAP 15 cm H2O.  Medications:   Allergies as of  10/18/2019      Reactions   Latex Itching   GLOVES (extended exposure)      Medication List       Accurate as of October 18, 2019 12:05 PM. If you have any questions, ask your nurse or doctor.        Accu-Chek Guide test strip Generic drug: glucose blood   B-12 1000 MCG Tabs   blood glucose meter kit and supplies Dispense based on patient and insurance preference. Use up to four times daily as directed. (FOR ICD-10 E10.9, E11.9).   CRANBERRY PO Take by mouth daily.   diclofenac sodium 1 % Gel Commonly known as:  VOLTAREN Apply 2 g topically 4 (four) times daily.   docusate sodium 100 MG capsule Commonly known as: COLACE Take 100 mg by mouth daily.   ELDERBERRY PO Take by mouth 2 (two) times daily. Gummies   ferrous sulfate 325 (65 FE) MG tablet Take 325 mg by mouth daily with breakfast.   hydrochlorothiazide 25 MG tablet Commonly known as: HYDRODIURIL 1 tab daily by mouth   hydrocortisone 2.5 % cream Apply topically 2 (two) times daily. As needed neck, face   ketoconazole 2 % cream Commonly known as: NIZORAL   metoprolol succinate 25 MG 24 hr tablet Commonly known as: TOPROL-XL Take 1 tablet (25 mg total) by mouth daily.   MOVE FREE JOINT HEALTH ADVANCE PO Take 2 tablets by mouth daily.   MULTIVITAMIN PO Take 1 packet by mouth See admin instructions. [name] Supplement Multivitamin Packet - Take 6 tablets by mouth in the morning and take 7 tablets by mouth at bedtime   naproxen sodium 220 MG tablet Commonly known as: ALEVE Take 220 mg by mouth daily as needed.   olmesartan 20 MG tablet Commonly known as: Benicar Take 1 tablet (20 mg total) by mouth daily.   SUPER B COMPLEX/C PO   triamcinolone cream 0.1 % Commonly known as: KENALOG Apply 1 application topically 2 (two) times daily.   venlafaxine XR 37.5 MG 24 hr capsule Commonly known as: [name] Take 1 capsule (37.5 mg total) by mouth daily.   VITAMIN D (ERGOCALCIFEROL) PO Take 5,000 Units by mouth daily.       Past Surgical History:  She  has a past surgical history that includes Wisdom tooth extraction; Hysteroscopy with D & C (N/A, 05/20/2018); Intrauterine device (iud) insertion (N/A, 05/20/2018); and Colonoscopy with propofol (N/A, 07/18/2019).  Family History:  Her family history includes Arthritis in her mother, paternal grandfather, and paternal grandmother; Breast cancer (age of onset: 87) in her maternal grandmother; Diabetes Mellitus I in her father; Diabetes Mellitus II in her paternal grandmother,  sister, and sister; Hyperlipidemia in her mother, paternal grandfather, and sister; Hypertension in her mother, paternal grandfather, and sister; Stroke in her paternal grandfather.  Social History:  She  reports that she has never smoked. She has never used smokeless tobacco. She reports current alcohol use of about 2.0 standard drinks of alcohol per week. She reports that she does not use drugs.

## 2019-10-26 ENCOUNTER — Encounter: Payer: Self-pay | Admitting: Internal Medicine

## 2019-10-27 ENCOUNTER — Other Ambulatory Visit: Payer: Self-pay | Admitting: Internal Medicine

## 2019-10-27 DIAGNOSIS — E1165 Type 2 diabetes mellitus with hyperglycemia: Secondary | ICD-10-CM

## 2019-10-27 DIAGNOSIS — I10 Essential (primary) hypertension: Secondary | ICD-10-CM

## 2019-10-27 DIAGNOSIS — R0602 Shortness of breath: Secondary | ICD-10-CM

## 2019-10-27 DIAGNOSIS — E785 Hyperlipidemia, unspecified: Secondary | ICD-10-CM

## 2019-10-31 ENCOUNTER — Ambulatory Visit
Admission: RE | Admit: 2019-10-31 | Discharge: 2019-10-31 | Disposition: A | Discharge status: Home / Self Care | Payer: BC Managed Care – PPO | Source: Ambulatory Visit | Attending: Internal Medicine | Admitting: Internal Medicine

## 2019-10-31 ENCOUNTER — Other Ambulatory Visit: Payer: Self-pay

## 2019-10-31 DIAGNOSIS — R0602 Shortness of breath: Secondary | ICD-10-CM | POA: Diagnosis not present

## 2019-11-13 ENCOUNTER — Telehealth: Payer: Self-pay | Admitting: Internal Medicine

## 2019-11-13 NOTE — Telephone Encounter (Signed)
I think you meant to send this to Dr. Frances Furbish not me. Don't think I have seen this patient but I see some notes from Dr. Frances Furbish. Happy new year, thanks Dr. Lucia Gaskins

## 2019-11-13 NOTE — Telephone Encounter (Signed)
Ok thanks  Can you forward to her or inform her   Thank you  Take care TMS

## 2019-11-13 NOTE — Telephone Encounter (Signed)
Patient is having headaches with h/o numbness and tingling seen 07/2019 [facility] neurology  Ordered MRI brain w/o contrast she had [facility] [place] phone [phone] fax [phone]   Results MRI 11/09/19:  Nonspecific T2 white matter hyperintensities in cerebral hemispheres, more prominent than anticipated for age non specific ddx demyelinating disease, vasculitis sequelae, lyme (pt denies h/o tick bite), chronic migraines or other process clinical correlation rec.   Right suboccipital subcutaneous 1 cm structure likely lymph node but recommend clinical correlation   Will refer back to [facility] for further work up  Can you all get pt and appt please?   thanks TMS

## 2019-11-13 NOTE — Telephone Encounter (Signed)
Informed pt of results 11/11/19  TMS

## 2019-11-14 ENCOUNTER — Encounter: Payer: Self-pay | Admitting: Internal Medicine

## 2019-11-14 NOTE — Telephone Encounter (Signed)
Noted  TMS 

## 2019-11-14 NOTE — Telephone Encounter (Signed)
Left vm asking for pt to call back so we could schedule EMG and f/u.  Dr. Frances Furbish would like for the EMG to be completed first and then the  F/u.

## 2019-11-14 NOTE — Telephone Encounter (Signed)
Kathleen Burke, would you please assist in rescheduling EMG/NCV and FU after, thx. Please see phone note.

## 2019-11-14 NOTE — Telephone Encounter (Signed)
I reached out to the pt and discussed scheduling EMG//NCS along with f/u after the study. Pt was agreeable to scheduling both appointment. She has been scheduled for the next available NCS//EMG on 12/22/2019 and her f/u with Dr. Frances Furbish has been schedule for 12/28/2019.

## 2019-11-14 NOTE — Telephone Encounter (Signed)
I would be happy to see her. Please encourage patient to call us back to schedule appt. I had ordered an EMG/NCV test, which she canceled.  I am not sure, what to make of the lymph node like swelling seen on the brain MRI, may need to see infectious disease or neurosurgery. Please consider these too.  I would like for her to do the EMG next. We can also consider MRI C spine.

## 2019-11-14 NOTE — Telephone Encounter (Signed)
Also can someone reach out from your office to schedule her an appt  I will also let her know to call but dont want this to get lost  Thanks TMS

## 2019-11-15 ENCOUNTER — Ambulatory Visit (INDEPENDENT_AMBULATORY_CARE_PROVIDER_SITE_OTHER): Payer: BC Managed Care – PPO | Admitting: Cardiology

## 2019-11-15 ENCOUNTER — Encounter: Payer: Self-pay | Admitting: Cardiology

## 2019-11-15 ENCOUNTER — Other Ambulatory Visit: Payer: Self-pay

## 2019-11-15 VITALS — BP 130/90 | HR 97 | Temp 98.1°F | Ht 66.0 in | Wt 245.0 lb

## 2019-11-15 DIAGNOSIS — R072 Precordial pain: Secondary | ICD-10-CM

## 2019-11-15 DIAGNOSIS — R0600 Dyspnea, unspecified: Secondary | ICD-10-CM

## 2019-11-15 DIAGNOSIS — I10 Essential (primary) hypertension: Secondary | ICD-10-CM | POA: Diagnosis not present

## 2019-11-15 DIAGNOSIS — R0609 Other forms of dyspnea: Secondary | ICD-10-CM

## 2019-11-15 DIAGNOSIS — Z6839 Body mass index (BMI) 39.0-39.9, adult: Secondary | ICD-10-CM

## 2019-11-15 NOTE — Patient Instructions (Signed)
Medication Instructions:  Your physician recommends that you continue on your current medications as directed. Please refer to the Current Medication list given to you today.  *If you need a refill on your cardiac medications before your next appointment, please call your pharmacy*  Lab Work: Your will need lab work prior to your Cardiac CTA. The lab is not fasting. Please have your lab drawn at the [facility] 2-3 days prior to your CT. You do not need an appointment. Their hours are Mon- Fri 7am-6pm.  If you have labs (blood work) drawn today and your tests are completely normal, you will receive your results only by: Marland Kitchen MyChart Message (if you have MyChart) OR . A paper copy in the mail If you have any lab test that is abnormal or we need to change your treatment, we will call you to review the results.  Testing/Procedures: Your physician has requested that you have an echocardiogram. Echocardiography is a painless test that uses sound waves to create images of your heart. It provides your doctor with information about the size and shape of your heart and how well your heart's chambers and valves are working. This procedure takes approximately one hour. There are no restrictions for this procedure.  Your physician has requested that you have cardiac CT. Cardiac computed tomography (CT) is a painless test that uses an x-ray machine to take clear, detailed pictures of your heart. For further information please visit [email or web address]. Please follow instruction sheet as given.     Follow-Up: At [facility], you and your health needs are our priority.  As part of our continuing mission to provide you with exceptional heart care, we have created designated Provider Care Teams.  These Care Teams include your primary Cardiologist (physician) and Advanced Practice Providers (APPs -  Physician Assistants and Nurse Practitioners) who all work together to provide you with the care you need,  when you need it.  Your next appointment:   2 month(s)  The format for your next appointment:   In Person  Provider:    You may see Kate Sable, MD or one of the following Advanced Practice Providers on your designated Care Team:    Murray Hodgkins, NP  Christell Faith, PA-C  Marrianne Mood, PA-C   Other Instructions Your cardiac CT will be scheduled at one of the below locations:   [facility] [address] [place], [place] [address] [phone]  [place] [address] [place],  [address] [phone]  If scheduled at [facility], please arrive at the [facility] main entrance of [facility] 30-45 minutes prior to test start time. Proceed to the [facility] Radiology Department (first floor) to check-in and test prep.  If scheduled at [facility], please arrive 15 mins early for check-in and test prep.  Please follow these instructions carefully (unless otherwise directed):   On the Night Before the Test: . Be sure to Drink plenty of water. . Do not consume any caffeinated/decaffeinated beverages or chocolate 12 hours prior to your test. . Do not take any antihistamines 12 hours prior to your test.  On the Day of the Test: . Drink plenty of water. Do not drink any water within one hour of the test. . Do not eat any food 4 hours prior to the test. . You may take your regular medications prior to the test.  . Take metoprolol (Lopressor) two hours  prior to test. . HOLD Hydrochlorothiazide morning of the test. . FEMALES- please wear underwire-free bra if available         After the Test: . Drink plenty of water. . After receiving IV contrast, you may experience a mild flushed feeling. This is normal. . On occasion, you may experience a mild rash up to 24 hours after the test. This is not dangerous. If this occurs, you can take Benadryl 25  mg and increase your fluid intake. . If you experience trouble breathing, this can be serious. If it is severe call 911 IMMEDIATELY. If it is mild, please call our office. . If you take any of these medications: Glipizide/Metformin, Avandament, Glucavance, please do not take 48 hours after completing test unless otherwise instructed.   Once we have confirmed authorization from your insurance company, we will call you to set up a date and time for your test.   For non-scheduling related questions, please contact the cardiac imaging nurse navigator should you have any questions/concerns: Rockwell Alexandria, RN Navigator Cardiac Imaging [facility] [phone] Office

## 2019-11-15 NOTE — Progress Notes (Signed)
Cardiology Office Note:    Date:  11/15/2019   ID:  Kathleen Burke, DOB 05/21/1976, MRN [id number]  PCP:  [name], [name], MD  Cardiologist:  Kate Sable, MD  Electrophysiologist:  None   Referring MD: [name], Olivia Mackie *   Chief Complaint  Patient presents with  . New Patient (Initial Visit)    SOB/chest pain with exertion; Meds verbally reviewed with patient.   Kathleen Burke is a 44 y.o. female who is being seen today for the evaluation of shortness of breath at the request of [name], Olivia Mackie *.  History of Present Illness:    Kathleen Burke is a 44 y.o. female with a hx of hypertension, prediabetic, obesity being seen for shortness of breath and chest pain.  Patient states symptoms started 2 months ago and occurs with exertion.  Going up stairs or walking fast causes her to have chest pressure which she describes as a 3 out of 10 and associated with shortness of breath.  Resting relieves symptoms roughly 3 to 5 minutes later.  She has avoided going up stairs and exerting herself as much as possible to prevent symptoms.  Denies any history of heart disease.  Denies prior smoking.  Denies palpitations, edema, presyncope or syncope.  Past Medical History:  Diagnosis Date  . Anemia   . Anxiety 2014  . Arthritis    right arm, right knee  . Chicken pox   . Depression   . Frequent headaches   . GERD (gastroesophageal reflux disease)   . Hyperlipemia   . Hypertension   . Obesity (BMI 30-39.9)   . Pre-diabetes   . Sleep apnea    CPAP  . Trigger finger    MIDDLE FINGER RIGHT HAND SURGERY ON JULY 31ST 2019    Past Surgical History:  Procedure Laterality Date  . COLONOSCOPY WITH PROPOFOL N/A 07/18/2019   Procedure: COLONOSCOPY WITH PROPOFOL;  Surgeon: [name], MD;  Location: [facility];  Service: Endoscopy;  Laterality: N/A;  Latex Sleep apnea  . HYSTEROSCOPY WITH D & C N/A 05/20/2018   Procedure: DILATATION AND CURETTAGE /HYSTEROSCOPY;   Surgeon: [name], MD;  Location: [facility];  Service: Gynecology;  Laterality: N/A;  . INTRAUTERINE DEVICE (IUD) INSERTION N/A 05/20/2018   Procedure: INTRAUTERINE DEVICE (IUD) INSERTION;  Surgeon: [name], MD;  Location: [facility];  Service: Gynecology;  Laterality: N/A;  . WISDOM TOOTH EXTRACTION      Current Medications: Current Meds  Medication Sig  . ACCU-CHEK GUIDE test strip   . blood glucose meter kit and supplies Dispense based on patient and insurance preference. Use up to four times daily as directed. (FOR ICD-10 E10.9, E11.9).  Marland Kitchen CRANBERRY PO Take by mouth daily.  . Cyanocobalamin (B-12) 1000 MCG TABS daily.   . diclofenac sodium (VOLTAREN) 1 % GEL Apply 2 g topically 4 (four) times daily. (Patient taking differently: Apply 2 g topically 4 (four) times daily as needed. )  . docusate sodium (COLACE) 100 MG capsule Take 100 mg by mouth daily.  . ferrous sulfate 325 (65 FE) MG tablet Take 325 mg by mouth daily with breakfast.  . Glucos-Chond-Hyal Ac-Ca Fructo (MOVE FREE JOINT HEALTH ADVANCE PO) Take 2 tablets by mouth daily.  . hydrochlorothiazide (HYDRODIURIL) 25 MG tablet 1 tab daily by mouth  . hydrocortisone 2.5 % cream Apply topically 2 (two) times daily. As needed neck, face  . ketoconazole (NIZORAL) 2 % cream daily as needed.   . metoprolol succinate (TOPROL-XL) 25  MG 24 hr tablet Take 1 tablet (25 mg total) by mouth daily.  . naproxen sodium (ALEVE) 220 MG tablet Take 220 mg by mouth daily as needed.  Marland Kitchen olmesartan (BENICAR) 20 MG tablet Take 1 tablet (20 mg total) by mouth daily.  . SUPER B COMPLEX/C PO   . triamcinolone cream (KENALOG) 0.1 % Apply 1 application topically 2 (two) times daily. (Patient taking differently: Apply 1 application topically 2 (two) times daily as needed. )  . TURMERIC PO Take 500 mg by mouth daily. With ginger  . VITAMIN D, ERGOCALCIFEROL, PO Take 5,000 Units by mouth daily.     Allergies:   Latex   Social History    Socioeconomic History  . Marital status: Married    Spouse name: Not on file  . Number of children: 2  . Years of education: Not on file  . Highest education level: Not on file  Occupational History  . Occupation: Therapist, sports  Tobacco Use  . Smoking status: Never Smoker  . Smokeless tobacco: Never Used  Substance and Sexual Activity  . Alcohol use: Yes    Alcohol/week: 2.0 standard drinks    Types: 2 Glasses of wine per week    Comment: OCC-WEEKENDS  . Drug use: No  . Sexual activity: Yes    Partners: Male    Birth control/protection: Surgical    Comment: vasectomy  Other Topics Concern  . Not on file  Social History Narrative   Married    2 kids    Works occup[employer]    Social Determinants of Health   Financial Resource Strain:   . Difficulty of Paying Living Expenses: Not on file  Food Insecurity:   . Worried About Charity fundraiser in the Last Year: Not on file  . Ran Out of Food in the Last Year: Not on file  Transportation Needs:   . Lack of Transportation (Medical): Not on file  . Lack of Transportation (Non-Medical): Not on file  Physical Activity:   . Days of Exercise per Week: Not on file  . Minutes of Exercise per Session: Not on file  Stress:   . Feeling of Stress : Not on file  Social Connections:   . Frequency of Communication with Friends and Family: Not on file  . Frequency of Social Gatherings with Friends and Family: Not on file  . Attends Religious Services: Not on file  . Active Member of Clubs or Organizations: Not on file  . Attends Archivist Meetings: Not on file  . Marital Status: Not on file     Family History: The patient's family history includes Arthritis in her mother, paternal grandfather, and paternal grandmother; Breast cancer (age of onset: 74) in her maternal grandmother; Diabetes Mellitus I in her father; Diabetes Mellitus II in her paternal grandmother, sister, and sister; Hyperlipidemia in her mother, paternal  grandfather, and sister; Hypertension in her mother, paternal grandfather, and sister; Stroke in her paternal grandfather.  ROS:   Please see the history of present illness.     All other systems reviewed and are negative.  EKGs/Labs/Other Studies Reviewed:    The following studies were reviewed today:   EKG:  EKG is ordered today.  The ekg ordered today demonstrates normal sinus rhythm, normal ECG.  Recent Labs: 12/01/2018: Magnesium 1.9 06/16/2019: TSH 1.300 09/19/2019: ALT 25; BUN 7; Creatinine, Ser 0.59; Hemoglobin 11.6; Platelets 344.0; Potassium 3.6; Sodium 138 09/28/2019: NT-Pro BNP 6  Recent Lipid Panel  Component Value Date/Time   CHOL 186 09/19/2019 0912   CHOL 215 (H) 06/16/2019 0000   TRIG 52.0 09/19/2019 0912   HDL 64.20 09/19/2019 0912   HDL 72 06/16/2019 0000   CHOLHDL 3 09/19/2019 0912   VLDL 10.4 09/19/2019 0912   LDLCALC 111 (H) 09/19/2019 0912   LDLCALC 134 (H) 06/16/2019 0000   LDLCALC 121 (H) 03/17/2018 1036     Physical Exam:    VS:  BP 130/90 (BP Location: Right Arm, Patient Position: Sitting, Cuff Size: Large)   Pulse 97   Temp 98.1 F (36.7 C)   Ht _0  (1.676 m)   Wt 245 lb (111.1 kg)   SpO2 97%   BMI 39.54 kg/m     Wt Readings from Last 3 Encounters:  11/15/19 245 lb (111.1 kg)  09/28/19 243 lb 14.4 oz (110.6 kg)  07/27/19 245 lb (111.1 kg)     GEN:  Well nourished, well developed in no acute distress HEENT: Normal NECK: No JVD; No carotid bruits LYMPHATICS: No lymphadenopathy CARDIAC: RRR, no murmurs, rubs, gallops RESPIRATORY:  Clear to auscultation without rales, wheezing or rhonchi  ABDOMEN: Soft, non-tender, non-distended MUSCULOSKELETAL:  No edema; No deformity  SKIN: Warm and dry NEUROLOGIC:  Alert and oriented x 3 PSYCHIATRIC:  Normal affect   ASSESSMENT:    1. Precordial pain   2. DOE (dyspnea on exertion)   3. Essential hypertension   4. BMI 39.0-39.9,adult    PLAN:    In order of problems listed  above:  1. Patient describes angina-like chest discomfort.  She has risk factors of obesity, hypertension, prediabetic.  Get coronary CT angiogram to evaluate CAD.  Obtain echocardiogram. 2. Dyspnea on exertion.  CT angiogram and echocardiogram as above. 3. Blood reasonably controlled.  Continue current BP meds for now. 4. Obese.  Weight loss advised.  This note was generated in part or whole with voice recognition software. Voice recognition is usually quite accurate but there are transcription errors that can and very often do occur. I apologize for any typographical errors that were not detected and corrected.  Medication Adjustments/Labs and Tests Ordered: Current medicines are reviewed at length with the patient today.  Concerns regarding medicines are outlined above.  Orders Placed This Encounter  Procedures  . CT CORONARY MORPH W/CTA COR W/SCORE W/CA W/CM &/OR WO/CM  . CT CORONARY FRACTIONAL FLOW RESERVE DATA PREP  . CT CORONARY FRACTIONAL FLOW RESERVE FLUID ANALYSIS  . Basic metabolic panel  . EKG 12-Lead  . ECHOCARDIOGRAM COMPLETE   No orders of the defined types were placed in this encounter.   Patient Instructions  Medication Instructions:  Your physician recommends that you continue on your current medications as directed. Please refer to the Current Medication list given to you today.  *If you need a refill on your cardiac medications before your next appointment, please call your pharmacy*  Lab Work: Your will need lab work prior to your Cardiac CTA. The lab is not fasting. Please have your lab drawn at the [facility] 2-3 days prior to your CT. You do not need an appointment. Their hours are Mon- Fri 7am-6pm.  If you have labs (blood work) drawn today and your tests are completely normal, you will receive your results only by: Marland Kitchen MyChart Message (if you have MyChart) OR . A paper copy in the mail If you have any lab test that is abnormal or we need to change  your treatment, we will call you to  review the results.  Testing/Procedures: Your physician has requested that you have an echocardiogram. Echocardiography is a painless test that uses sound waves to create images of your heart. It provides your doctor with information about the size and shape of your heart and how well your heart's chambers and valves are working. This procedure takes approximately one hour. There are no restrictions for this procedure.  Your physician has requested that you have cardiac CT. Cardiac computed tomography (CT) is a painless test that uses an x-ray machine to take clear, detailed pictures of your heart. For further information please visit [email or web address]. Please follow instruction sheet as given.     Follow-Up: At [facility], you and your health needs are our priority.  As part of our continuing mission to provide you with exceptional heart care, we have created designated Provider Care Teams.  These Care Teams include your primary Cardiologist (physician) and Advanced Practice Providers (APPs -  Physician Assistants and Nurse Practitioners) who all work together to provide you with the care you need, when you need it.  Your next appointment:   2 month(s)  The format for your next appointment:   In Person  Provider:    You may see Kate Sable, MD or one of the following Advanced Practice Providers on your designated Care Team:    Murray Hodgkins, NP  Christell Faith, PA-C  Marrianne Mood, PA-C   Other Instructions Your cardiac CT will be scheduled at one of the below locations:   [facility] [address] [place], [place] [address] [phone]  [facility] [address] [place], [place] [address] [phone]  If scheduled at [facility], please arrive at the [facility] main entrance of [facility] 30-45 minutes prior to test start time. Proceed  to the [facility] Radiology Department (first floor) to check-in and test prep.  If scheduled at [facility], please arrive 15 mins early for check-in and test prep.  Please follow these instructions carefully (unless otherwise directed):   On the Night Before the Test: . Be sure to Drink plenty of water. . Do not consume any caffeinated/decaffeinated beverages or chocolate 12 hours prior to your test. . Do not take any antihistamines 12 hours prior to your test.  On the Day of the Test: . Drink plenty of water. Do not drink any water within one hour of the test. . Do not eat any food 4 hours prior to the test. . You may take your regular medications prior to the test.  . Take metoprolol (Lopressor) two hours prior to test. . HOLD Hydrochlorothiazide morning of the test. . FEMALES- please wear underwire-free bra if available         After the Test: . Drink plenty of water. . After receiving IV contrast, you may experience a mild flushed feeling. This is normal. . On occasion, you may experience a mild rash up to 24 hours after the test. This is not dangerous. If this occurs, you can take Benadryl 25 mg and increase your fluid intake. . If you experience trouble breathing, this can be serious. If it is severe call 911 IMMEDIATELY. If it is mild, please call our office. . If you take any of these medications: Glipizide/Metformin, Avandament, Glucavance, please do not take 48 hours after completing test unless otherwise instructed.   Once we have confirmed authorization from your insurance company, we will call you to set  up a date and time for your test.   For non-scheduling related questions, please contact the cardiac imaging nurse navigator should you have any questions/concerns: Marchia Bond, RN Navigator Cardiac Imaging [facility] [phone] Office        Signed, Kate Sable, MD  11/15/2019 8:35 AM    [place]

## 2019-11-29 ENCOUNTER — Other Ambulatory Visit: Payer: Self-pay | Admitting: *Deleted

## 2019-11-29 ENCOUNTER — Telehealth: Payer: Self-pay | Admitting: Internal Medicine

## 2019-11-29 DIAGNOSIS — I10 Essential (primary) hypertension: Secondary | ICD-10-CM

## 2019-11-29 DIAGNOSIS — R000 Tachycardia, unspecified: Secondary | ICD-10-CM

## 2019-11-29 MED ORDER — METOPROLOL SUCCINATE ER 25 MG PO TB24: 25.0000 mg | ORAL_TABLET | Freq: Every day | ORAL | 3 refills | Status: DC

## 2019-11-29 NOTE — Telephone Encounter (Signed)
Pt called about needing a refill Rx for metoprolol succinate (TOPROL-XL) 25 MG 24 hr tablet  Pharmacy is [id number] [place], [place] - [phone]  Call pt @ [phone]

## 2019-11-29 NOTE — Telephone Encounter (Signed)
Refill sent.

## 2019-12-02 ENCOUNTER — Other Ambulatory Visit: Payer: Self-pay

## 2019-12-02 ENCOUNTER — Ambulatory Visit (INDEPENDENT_AMBULATORY_CARE_PROVIDER_SITE_OTHER): Payer: BC Managed Care – PPO

## 2019-12-02 DIAGNOSIS — R0600 Dyspnea, unspecified: Secondary | ICD-10-CM | POA: Diagnosis not present

## 2019-12-02 DIAGNOSIS — R0609 Other forms of dyspnea: Secondary | ICD-10-CM

## 2019-12-02 DIAGNOSIS — R072 Precordial pain: Secondary | ICD-10-CM | POA: Diagnosis not present

## 2019-12-12 NOTE — Telephone Encounter (Signed)
Mirena rcvd in OR 05/20/18

## 2019-12-16 ENCOUNTER — Other Ambulatory Visit
Admission: RE | Admit: 2019-12-16 | Discharge: 2019-12-16 | Disposition: A | Discharge status: Home / Self Care | Payer: BC Managed Care – PPO | Source: Ambulatory Visit | Attending: Cardiology | Admitting: Cardiology

## 2019-12-16 DIAGNOSIS — R0609 Other forms of dyspnea: Secondary | ICD-10-CM

## 2019-12-16 DIAGNOSIS — R072 Precordial pain: Secondary | ICD-10-CM | POA: Insufficient documentation

## 2019-12-16 DIAGNOSIS — R0600 Dyspnea, unspecified: Secondary | ICD-10-CM

## 2019-12-16 LAB — BASIC METABOLIC PANEL
Anion gap: 12 (ref 5–15)
BUN: 12 mg/dL (ref 6–20)
CO2: 29 mmol/L (ref 22–32)
Calcium: 9.4 mg/dL (ref 8.9–10.3)
Chloride: 96 mmol/L — ABNORMAL LOW (ref 98–111)
Creatinine, Ser: 0.76 mg/dL (ref 0.44–1.00)
GFR calc Af Amer: >60 mL/min (ref >60)
GFR calc non Af Amer: >60 mL/min (ref >60)
Glucose, Bld: 113 mg/dL — ABNORMAL HIGH (ref 70–99)
Potassium: 3.4 mmol/L — ABNORMAL LOW (ref 3.5–5.1)
Sodium: 137 mmol/L (ref 135–145)

## 2019-12-20 ENCOUNTER — Telehealth (HOSPITAL_COMMUNITY): Payer: Self-pay | Admitting: Emergency Medicine

## 2019-12-20 ENCOUNTER — Encounter (HOSPITAL_COMMUNITY): Payer: Self-pay

## 2019-12-20 NOTE — Telephone Encounter (Signed)
Left message on voicemail with name and callback number Dewan Emond RN Navigator Cardiac Imaging [facility] [phone] Office [phone] Cell  

## 2019-12-21 ENCOUNTER — Ambulatory Visit (HOSPITAL_COMMUNITY)
Admission: RE | Admit: 2019-12-21 | Discharge: 2019-12-21 | Disposition: A | Discharge status: Home / Self Care | Payer: BC Managed Care – PPO | Source: Ambulatory Visit | Attending: Cardiology | Admitting: Cardiology

## 2019-12-21 ENCOUNTER — Encounter (HOSPITAL_COMMUNITY): Payer: Self-pay

## 2019-12-21 ENCOUNTER — Other Ambulatory Visit: Payer: Self-pay

## 2019-12-21 ENCOUNTER — Telehealth: Payer: Self-pay | Admitting: *Deleted

## 2019-12-21 DIAGNOSIS — R0600 Dyspnea, unspecified: Secondary | ICD-10-CM | POA: Diagnosis present

## 2019-12-21 DIAGNOSIS — R0609 Other forms of dyspnea: Secondary | ICD-10-CM

## 2019-12-21 DIAGNOSIS — Z006 Encounter for examination for normal comparison and control in clinical research program: Secondary | ICD-10-CM

## 2019-12-21 DIAGNOSIS — R072 Precordial pain: Secondary | ICD-10-CM | POA: Diagnosis not present

## 2019-12-21 MED ORDER — NITROGLYCERIN 0.4 MG SL SUBL: 0.8000 mg | SUBLINGUAL_TABLET | Freq: Once | SUBLINGUAL | Status: DC

## 2019-12-21 MED ORDER — METOPROLOL TARTRATE 5 MG/5ML IV SOLN
5.0000 mg | INTRAVENOUS | Status: DC | PRN
  Administered 2019-12-21: 16:00:00 5 mg via INTRAVENOUS

## 2019-12-21 MED ORDER — METOPROLOL TARTRATE 5 MG/5ML IV SOLN: 15.0000 mg | Freq: Once | INTRAVENOUS | Status: AC

## 2019-12-21 MED ORDER — METOPROLOL TARTRATE 5 MG/5ML IV SOLN
INTRAVENOUS | Status: AC
  Administered 2019-12-21: 16:00:00 5 mg via INTRAVENOUS
  Filled 2019-12-21: qty 20

## 2019-12-21 MED ORDER — METOPROLOL TARTRATE 5 MG/5ML IV SOLN
INTRAVENOUS | Status: AC
  Administered 2019-12-21: 16:00:00 15 mg via INTRAVENOUS
  Filled 2019-12-21: qty 5

## 2019-12-21 NOTE — Progress Notes (Signed)
Patient in radiology department for CT Cardiac exam. Patient heart rate on arrival is 105 (sinus tachycardia). Patient states she took scheduled Metoprolol as directed prior to exam. Dr. Jacques Navy informed of patient heart rate prior to starting IV. Md would like to proceed with exam and give patient IV lopressor as directed by coronary protocol. 10mg  of Lopressor given but patient heart rate still 90s. CT heart navigator nurse informed. CT navigator nurse was able to get in touch with ordering cardiologist, Dr. Rockwell Alexandria. Dr. Azucena Cecil ordered additional 15mg  of IV Lopressor. Additional IV Lopressor given but heart rate still 80-90s, heart rate does not go down with breath holds, BP stable.   Unable to get heart rate down, patient to be rescheduled. Patient aware that test will have to be rescheduled and cardiologist will be reaching out to reschedule test with additional medications.

## 2019-12-21 NOTE — Research (Signed)
Cadfem Informed Consent    Patient Name: Kathleen Burke   Subject met inclusion and exclusion criteria.  The informed consent form, study requirements and expectations were reviewed with the subject and questions and concerns were addressed prior to the signing of the consent form.  The subject verbalized understanding of the trail requirements.  The subject agreed to participate in the CADFEM trial and signed the informed consent.  The informed consent was obtained prior to performance of any protocol-specific procedures for the subject.  A copy of the signed informed consent was given to the subject and a copy was placed in the subject's medical record.   Neva Seat

## 2019-12-21 NOTE — Telephone Encounter (Signed)
Patient unable to have Cardiac CT successfully today due to HR being elevated. Per Dr. Azucena Cecil, please have patient take Lopressor 100 mg 2 hours prior to CT and have CT rescheduled.

## 2019-12-22 ENCOUNTER — Ambulatory Visit (INDEPENDENT_AMBULATORY_CARE_PROVIDER_SITE_OTHER): Payer: BC Managed Care – PPO | Admitting: Diagnostic Neuroimaging

## 2019-12-22 ENCOUNTER — Encounter: Payer: BC Managed Care – PPO | Admitting: Diagnostic Neuroimaging

## 2019-12-22 DIAGNOSIS — E669 Obesity, unspecified: Secondary | ICD-10-CM

## 2019-12-22 DIAGNOSIS — Z0289 Encounter for other administrative examinations: Secondary | ICD-10-CM

## 2019-12-22 DIAGNOSIS — R200 Anesthesia of skin: Secondary | ICD-10-CM | POA: Diagnosis not present

## 2019-12-22 DIAGNOSIS — R202 Paresthesia of skin: Secondary | ICD-10-CM

## 2019-12-22 DIAGNOSIS — R7303 Prediabetes: Secondary | ICD-10-CM

## 2019-12-22 MED ORDER — METOPROLOL TARTRATE 100 MG PO TABS: 100.0000 mg | ORAL_TABLET | Freq: Once | ORAL | 0 refills | Status: DC

## 2019-12-22 NOTE — Telephone Encounter (Signed)
Called patient and she is agreeable to take the lopressor 100 mg x1 2 hours prior to the CT. She is aware someone will be calling her to reschedule it. Rx sent to pharmacy.

## 2019-12-28 ENCOUNTER — Other Ambulatory Visit: Payer: Self-pay

## 2019-12-28 ENCOUNTER — Ambulatory Visit: Payer: BC Managed Care – PPO | Admitting: Neurology

## 2019-12-28 ENCOUNTER — Telehealth: Payer: Self-pay

## 2019-12-28 ENCOUNTER — Encounter: Payer: Self-pay | Admitting: Neurology

## 2019-12-28 VITALS — BP 122/76 | HR 104 | Temp 97.3°F | Ht 66.0 in | Wt 243.0 lb

## 2019-12-28 DIAGNOSIS — G4489 Other headache syndrome: Secondary | ICD-10-CM

## 2019-12-28 DIAGNOSIS — R519 Headache, unspecified: Secondary | ICD-10-CM | POA: Diagnosis not present

## 2019-12-28 DIAGNOSIS — R9082 White matter disease, unspecified: Secondary | ICD-10-CM

## 2019-12-28 DIAGNOSIS — R200 Anesthesia of skin: Secondary | ICD-10-CM | POA: Diagnosis not present

## 2019-12-28 DIAGNOSIS — R202 Paresthesia of skin: Secondary | ICD-10-CM

## 2019-12-28 NOTE — Progress Notes (Signed)
Subjective:    Patient ID: Kathleen Burke is a 44 y.o. female.  HPI     Interim history:   Kathleen Burke is a 44 year old right-handed woman with an underlying medical history of pre-diabetes, hypertension, reflux disease, depression, anxiety, anemia, arthritis, obstructive sleep apnea and obesity, who presents for follow-up consultation of her numbness and tingling affecting both upper extremities.  The patient is unaccompanied today.  I first met her at the request of her primary care physician on 07/27/2019, at which time she reported a 6+ month history of numbness and tingling affecting both upper extremities, started in the right hand, mildly progressive.  Her neurological exam was benign and she was advised to proceed with additional testing in the form of blood work and EMG and nerve conduction velocity testing.  Blood work from 07/27/2019 was benign, with the exception of slightly increased ESR at 38.  She had an EMG nerve conduction velocity test on 12/22/2019, the official report is pending at this time. Today, 12/28/2019: She reports that her hand numbness on the right side is slightly better.  She tried over-the-counter wrist splints but it is difficult for her to use them at night as she also has to put her CPAP on.  She has had some recurrent headaches and her primary care physician had ordered a brain MRI.  She had a brain MRI without contrast interim at [facility] on 11/09/2019 and I reviewed the report: Impression: Nonspecific T2 white matter hyperintensities in the cerebral hemispheres.  These are more prominent than anticipated for the patient's age, but are nonspecific as to etiology.  Differential diagnosis would include sequelae of demyelinating disease, sequelae of vasculitis, Lyme disease, chronic migraines or other process.  Clinical correlation is recommended.  She reports no new symptoms, her headaches are not debilitating.  She was recently taken off of her  antidepressant medication in the hopes that her headaches would improve and there is not much change.  She denies any need to take daily medication, she was using Tylenol as needed.  She has not had a prior label of migraines.  She tries to hydrate well typically but could do better.  She uses her CPAP regularly.  She has not had a formal eye examination done and reports that she is due as it has been about 2 years.  She denies any one-sided weakness or new numbness, no facial droop or weakness in the face or numbness or tingling, no visual symptoms.  The patient's allergies, current medications, family history, past medical history, past social history, past surgical history and problem list were reviewed and updated as appropriate.   Previously:   07/27/2019: (She) reports numbness and tingling affecting both upper extremities for the past 6+ months, less than 1 year, mildly progressive, started with the right hand in the fingertips.  She has primarily symptoms in her fingertips, not so much the entire hand, left hand is less affected than right, no symptoms in her feet, no radiating pain from the shoulders or neck area.  She has not fallen.  She feels weak sometimes and stiff in her hands especially first thing in the morning and symptoms seem to be worse first thing in the morning, not so much at night, not telltale for carpal tunnel symptoms.  I reviewed your tele medicine note from 06/16/2019.  She has had blood work recently in August, A1c came down from 6.5-6.3.  She has been prediabetic for years.  She has not been  on medication for diabetes.  She has been working on weight loss.  She is on CPAP at night.  She has no hereditary neuropathy in her family history but her dad has diabetes and has associated neuropathy.  She works as a Marine scientist.  She lives with her family, including husband and 2 children.  She is not on anything for diabetes, is working on weight loss and diet control.  I reviewed her blood  work, she had TSH and B12, B12 was on the lower end of the spectrum.  She had a mildly elevated lipid profile.  She is a non-smoker, drinks alcohol occasionally on the weekends, caffeine occasionally.  Her Past Medical History Is Significant For: Past Medical History:  Diagnosis Date  . Anemia   . Anxiety 2014  . Arthritis    right arm, right knee  . Chicken pox   . Depression   . Frequent headaches   . GERD (gastroesophageal reflux disease)   . Hyperlipemia   . Hypertension   . Obesity (BMI 30-39.9)   . Pre-diabetes   . Sleep apnea    CPAP  . Trigger finger    MIDDLE FINGER RIGHT HAND SURGERY ON JULY 31ST 2019    Her Past Surgical History Is Significant For: Past Surgical History:  Procedure Laterality Date  . COLONOSCOPY WITH PROPOFOL N/A 07/18/2019   Procedure: COLONOSCOPY WITH PROPOFOL;  Surgeon: [name], MD;  Location: [facility];  Service: Endoscopy;  Laterality: N/A;  Latex Sleep apnea  . HYSTEROSCOPY WITH D & C N/A 05/20/2018   Procedure: DILATATION AND CURETTAGE /HYSTEROSCOPY;  Surgeon: [name], MD;  Location: [facility];  Service: Gynecology;  Laterality: N/A;  . INTRAUTERINE DEVICE (IUD) INSERTION N/A 05/20/2018   Procedure: INTRAUTERINE DEVICE (IUD) INSERTION;  Surgeon: [name], MD;  Location: [facility];  Service: Gynecology;  Laterality: N/A;  . WISDOM TOOTH EXTRACTION      Her Family History Is Significant For: Family History  Problem Relation Age of Onset  . Arthritis Mother   . Hyperlipidemia Mother   . Hypertension Mother   . Diabetes Mellitus I Father        DM I  . Breast cancer Maternal Grandmother 96  . Arthritis Paternal Grandmother   . Diabetes Mellitus II Paternal Grandmother   . Arthritis Paternal Grandfather   . Hyperlipidemia Paternal Grandfather   . Stroke Paternal Grandfather   . Hypertension Paternal Grandfather   . Diabetes Mellitus II Sister   . Diabetes Mellitus II Sister   . Hyperlipidemia Sister   .  Hypertension Sister     Her Social History Is Significant For: Social History   Socioeconomic History  . Marital status: Married    Spouse name: Not on file  . Number of children: 2  . Years of education: Not on file  . Highest education level: Not on file  Occupational History  . Occupation: Therapist, sports  Tobacco Use  . Smoking status: Never Smoker  . Smokeless tobacco: Never Used  Substance and Sexual Activity  . Alcohol use: Yes    Alcohol/week: 2.0 standard drinks    Types: 2 Glasses of wine per week    Comment: OCC-WEEKENDS  . Drug use: No  . Sexual activity: Yes    Partners: Male    Birth control/protection: Surgical    Comment: vasectomy  Other Topics Concern  . Not on file  Social History Narrative   Married    2 kids    Works occup. [employer]    Social Determinants of Health   Financial Resource Strain:   . Difficulty of Paying Living Expenses: Not on file  Food Insecurity:   . Worried About Charity fundraiser in the Last Year: Not on file  . Ran Out of Food in the Last Year: Not on file  Transportation Needs:   . Lack of Transportation (Medical): Not on file  . Lack of Transportation (Non-Medical): Not on file  Physical Activity:   . Days of Exercise per Week: Not on file  . Minutes of Exercise per Session: Not on file  Stress:   . Feeling of Stress : Not on file  Social Connections:   . Frequency of Communication with Friends and Family: Not on file  . Frequency of Social Gatherings with Friends and Family: Not on file  . Attends Religious Services: Not on file  . Active Member of Clubs or Organizations: Not on file  . Attends Archivist Meetings: Not on file  . Marital Status: Not on file    Her Allergies Are:  Allergies  Allergen Reactions  . Latex Itching    GLOVES (extended exposure)  :   Her Current Medications Are:  Outpatient Encounter Medications as of 12/28/2019  Medication Sig  . ACCU-CHEK GUIDE test strip   . blood glucose  meter kit and supplies Dispense based on patient and insurance preference. Use up to four times daily as directed. (FOR ICD-10 E10.9, E11.9).  Marland Kitchen CRANBERRY PO Take by mouth daily.  . Cyanocobalamin (B-12) 1000 MCG TABS daily.   . diclofenac sodium (VOLTAREN) 1 % GEL Apply 2 g topically 4 (four) times daily. (Patient taking differently: Apply 2 g topically 4 (four) times daily as needed. )  . docusate sodium (COLACE) 100 MG capsule Take 100 mg by mouth daily.  Marland Kitchen ELDERBERRY PO Take by mouth 2 (two) times daily. Gummies  . ferrous sulfate 325 (65 FE) MG tablet Take 325 mg by mouth daily with breakfast.  . Glucos-Chond-Hyal Ac-Ca Fructo (MOVE FREE JOINT HEALTH ADVANCE PO) Take 2 tablets by mouth daily.  . hydrochlorothiazide (HYDRODIURIL) 25 MG tablet 1 tab daily by mouth  . hydrocortisone 2.5 % cream Apply topically 2 (two) times daily. As needed neck, face  . ketoconazole (NIZORAL) 2 % cream daily as needed.   . metoprolol succinate (TOPROL-XL) 25 MG 24 hr tablet Take 1 tablet (25 mg total) by mouth daily.  . Multiple Vitamins-Minerals (MULTIVITAMIN PO) Take 1 packet by mouth See admin instructions. [name] Supplement Multivitamin Packet - Take 6 tablets by mouth in the morning and take 7 tablets by mouth at bedtime  . naproxen sodium (ALEVE) 220 MG tablet Take 220 mg by mouth daily as needed.  Marland Kitchen olmesartan (BENICAR) 20 MG tablet Take 1 tablet (20 mg total) by mouth daily.  . SUPER B COMPLEX/C PO   . triamcinolone cream (KENALOG) 0.1 % Apply 1 application topically 2 (two) times daily. (Patient taking differently: Apply 1 application topically 2 (two) times daily as needed. )  . TURMERIC PO Take 500 mg by mouth daily. With ginger  . venlafaxine XR ([name]) 37.5 MG 24 hr capsule Take 1 capsule (37.5 mg total) by mouth daily.  Marland Kitchen VITAMIN D, ERGOCALCIFEROL, PO Take 5,000 Units by mouth daily.  . metoprolol tartrate (LOPRESSOR) 100 MG tablet Take 1 tablet (100 mg total) by mouth once for 1 dose. Take  2 hours prior to CT.   No facility-administered encounter medications on file as  of 12/28/2019.  :  Review of Systems:  Out of a complete 14 point review of systems, all are reviewed and negative with the exception of these symptoms as listed below: Review of Systems  Neurological:       Pt repots she is here to f/u on MRI completed by Dr. Aundra Dubin on 11/09/2019     Objective:  Neurological Exam  Physical Exam Physical Examination:   Vitals:   12/28/19 1336  BP: 122/76  Pulse: (!) 104  Temp: (!) 97.3 F (36.3 C)    General Examination: The patient is a very pleasant 44 y.o. female in no acute distress. She appears well-developed and well-nourished and well groomed.   HEENT: Normocephalic, atraumatic, pupils are equal, round and reactive to light and accommodation. Corrective eyeglasses in place. Funduscopic exam shows no obvious abnormality. Extraocular tracking is well preserved in all directions, no nystagmus.  Hearing is grossly intact.  Face is symmetric with normal facial animation and normal facial sensation. Speech is clear with no dysarthria noted. There is no hypophonia. There is no lip, neck/head, jaw or voice tremor. Neck is supple with full range of passive and active motion. There are no carotid bruits on auscultation. Oropharynx exam reveals: Benign findings, tongue protrudes centrally in palate elevates symmetrically.   Chest: Clear to auscultation without wheezing, rhonchi or crackles noted.  Heart: S1+S2+0, regular and normal without murmurs, rubs or gallops noted.   Abdomen: Soft, non-tender and non-distended with normal bowel sounds appreciated on auscultation.  Extremities: There is no pitting edema in the distal lower extremities bilaterally. Pedal pulses are intact.  Skin: Warm and dry without trophic changes noted.  Musculoskeletal: exam reveals no obvious joint deformities, tenderness or joint swelling or erythema.   Neurologically:  Mental status:  The patient is awake, alert and oriented in all 4 spheres. Her immediate and remote memory, attention, language skills and fund of knowledge are appropriate. There is no evidence of aphasia, agnosia, apraxia or anomia. Speech is clear with normal prosody and enunciation. Thought process is linear. Mood is normal and affect is normal.  Cranial nerves II - XII are as described above under HEENT exam. In addition: shoulder shrug is normal with equal shoulder height noted. Motor exam: Normal bulk, strength and tone is noted. There is no drift, tremor or rebound. Romberg is negative. Reflexes are 2+ throughout. Babinski: Toes are flexor bilaterally. Fine motor skills and coordination: intact with normal finger taps, normal hand movements, normal rapid alternating patting, normal foot taps and normal foot agility.  Cerebellar testing: No dysmetria or intention tremor on finger to nose testing. Heel to shin is unremarkable bilaterally. There is no truncal or gait ataxia.  Sensory exam: intact to light touch, vibration, temperature sense in the upper and lower extremities.  Gait, station and balance: She stands easily. No veering to one side is noted. No leaning to one side is noted. Posture is age-appropriate and stance is narrow based. Gait shows normal stride length and normal pace. No problems turning are noted. Tandem walk is unremarkable.             Assessment and Plan:   In summary, Kathleen Burke is a very pleasant 44 year old female with an underlying medical history of pre-diabetes, hypertension, reflux disease, depression, anxiety, anemia, arthritis, obstructive sleep apnea and obesity, who presents for Follow-up consultation of her hand numbness and tingling, right more than left.  She had relatively benign lab work in September 2020 and a recent  EMG nerve conduction velocity test which is not officially reported yet but she was verbally told that there was some evidence of median neuropathy.  She may  benefit from seeing an orthopedic surgeon/hand specialist.  I will make a referral once we have an official report back. She would like to see somebody in her area, she lives in [place] and had seen orthopedics before in [place].  I would be happy to make a referral, we will await test results for now.She had a recent brain MRI through her primary care physician, she had a noncontrast MRI in January 2021.  She has a benign neurological exam today.  Nevertheless, I suggested she have a formal eye examination done since it has been about 2 years since her last eye exam.In addition, for completion, I suggested we proceed with a brain MRI with and without contrast, she would prefer the same location interim, [place] which should not be a problem.  I placed a new order for this and we will call her with her test results to keep her posted as to her MRI results and EMG nerve conduction velocity test results.  She is largely reassured today.  She can follow-up as needed.  I answered all her questions today and she was in agreement.  I spent 30 minutes in total face-to-face time and in reviewing records during pre-charting, more than 50% of which was spent in counseling and coordination of care, reviewing test results, reviewing medications and treatment regimen and/or in discussing or reviewing the diagnosis of CTS, abn brain MRI, the prognosis and treatment options. Pertinent laboratory and imaging test results that were available during this visit with the patient were reviewed by me and considered in my medical decision making (see chart for details).

## 2019-12-28 NOTE — Telephone Encounter (Signed)
I contacted the Kathleen Burke per Dr. Johny Sax request and advised EMG/NCS has not been finalized via vm.  Kathleen Burke wsa advised we could still see her today but we would not be able to review the study.

## 2019-12-28 NOTE — Procedures (Signed)
[facility]  NCS (NERVE CONDUCTION STUDY) WITH EMG (ELECTROMYOGRAPHY) REPORT   STUDY DATE: 12/22/19 PATIENT NAME: Kathleen Burke DOB: [birth year] MRN: [id number]  ORDERING CLINICIAN: Star Age, MD PhD   TECHNOLOGIST: Sherre Scarlet ELECTROMYOGRAPHER: Earlean Polka. Oliwia Berzins, MD  CLINICAL INFORMATION: 44 year old female with bilateral hand numbness right greater than left.  FINDINGS: NERVE CONDUCTION STUDY: Right median motor response prolonged is latency (9.2 ms) decreased amplitude and slow conduction velocity.  Left median and bilateral ulnar motor responses are normal.  Bilateral median to ulnar transcarpal mixed nerve comparisons notable for significant prolonged peak latency difference on the right side and slightly on the left side.  Right median sensory response has decreased amplitude and prolonged peak latency.  Left median and bilateral ulnar extensor responses are normal.  Bilateral ulnar F-wave latencies are normal.   NEEDLE ELECTROMYOGRAPHY:  Needle examination of right upper extremity is normal.   IMPRESSION:   Abnormal study demonstrating: - Moderate to severe right carpal tunnel syndrome (median neuropathy at the wrist). - Very mild carpal tunnel syndrome on the left.     INTERPRETING PHYSICIAN:  Penni Bombard, MD Certified in Neurology, Neurophysiology and Neuroimaging  [facility] [address] [place], [place] [address] [phone]  [facility]    Nerve / Sites Muscle Latency Ref. Amplitude Ref. Rel Amp Segments Distance Velocity Ref. Area    ms ms mV mV %  cm m/s m/s mVms  L Median - APB     Wrist APB 4.0 ?4.4 11.6 ?4.0 100 Wrist - APB 7   46.1     Upper arm APB 8.4  11.7  101 Upper arm - Wrist 23 53 ?49 47.6  R Median - APB     Wrist APB 9.2 ?4.4 2.3 ?4.0 100 Wrist - APB 7   8.0     Upper arm APB 13.3  4.3  184 Upper arm - Wrist 23 57 ?49 24.4  L Ulnar - ADM     Wrist ADM 2.7 ?3.3 12.9 ?6.0 100  Wrist - ADM 7   37.4     B.Elbow ADM 6.8  10.5  81.5 B.Elbow - Wrist 22 54 ?49 31.7     A.Elbow ADM 8.6  10.0  95 A.Elbow - B.Elbow 10 56 ?49 31.2         A.Elbow - Wrist      R Ulnar - ADM     Wrist ADM 3.1 ?3.3 11.1 ?6.0 100 Wrist - ADM 7   35.6     B.Elbow ADM 7.1  9.9  88.7 B.Elbow - Wrist 21 53 ?49 33.3     A.Elbow ADM 9.1  9.2  93.2 A.Elbow - B.Elbow 10 51 ?49 32.0         A.Elbow - Wrist                 SNC    Nerve / Sites Rec. Site Peak Lat Ref.  Amp Ref. Segments Distance Peak Diff Ref.    ms ms V V  cm ms ms  L Median, Ulnar - Transcarpal comparison     Median Palm Wrist 2.4 ?2.2 59 ?35 Median Palm - Wrist 8       Ulnar Palm Wrist 1.9 ?2.2 21 ?12 Ulnar Palm - Wrist 8          Median Palm - Ulnar Palm  0.5 ?0.4  R Median, Ulnar - Transcarpal comparison     Median Palm Wrist 7.3 ?  2.2 8 ?35 Median Palm - Wrist 8       Ulnar Palm Wrist 2.0 ?2.2 14 ?12 Ulnar Palm - Wrist 8          Median Palm - Ulnar Palm  5.3 ?0.4  L Median - Orthodromic (Dig II, Mid palm)     Dig II Wrist 3.4 ?3.4 11 ?10 Dig II - Wrist 13    R Median - Orthodromic (Dig II, Mid palm)     Dig II Wrist 4.7 ?3.4 8 ?10 Dig II - Wrist 13    L Ulnar - Orthodromic, (Dig V, Mid palm)     Dig V Wrist 2.8 ?3.1 8 ?5 Dig V - Wrist 11    R Ulnar - Orthodromic, (Dig V, Mid palm)     Dig V Wrist 2.9 ?3.1 11 ?5 Dig V - Wrist 22                   F  Wave    Nerve F Lat Ref.   ms ms  L Ulnar - ADM 29.3 ?32.0  R Ulnar - ADM 29.8 ?32.0         EMG Summary Table    Spontaneous MUAP Recruitment  Muscle IA Fib PSW Fasc Other Amp Dur. Poly Pattern  R. Deltoid Normal None None None _______ Normal Normal Normal Normal  R. Biceps brachii Normal None None None _______ Normal Normal Normal Normal  R. Triceps brachii Normal None None None _______ Normal Normal Normal Normal  R. Flexor carpi radialis Normal None None None _______ Normal Normal Normal Normal  R. First dorsal interosseous Normal None None None _______ Normal  Normal Normal Normal

## 2019-12-28 NOTE — Patient Instructions (Signed)
I am glad to hear that your numbness in the right hand is a little bit better.  We will call you with the official report on your recent EMG and nerve conduction velocity test. If you have evidence of carpal tunnel syndrome, I would like to make a referral to hand surgery/orthopedics, we can choose a location close to your home.  As discussed, to further evaluate your white matter abnormality on the brain MRI without contrast from January 2021, I will order a repeat brain MRI with and without contrast at the same location in [place].    We will call you with the results once they are available.  Your exam is good today and reassuring.   Please schedule your eye examination as it has been about 2 years since your last formal eye exam.

## 2019-12-29 NOTE — Progress Notes (Signed)
In FU to yesterday's visit: EMG/NCV results are in keeping with b/l CTS. It is moderate to severe on the right and very mild on the L. As discussed, we can refer her to hand surgery for opinion re: surgical release and/or steroid inj.  She would prefer a location close to home and mentioned a surgeon/practice in [place], please confirm and put referral in after you have a chance to talk to her, thx.

## 2019-12-30 ENCOUNTER — Telehealth: Payer: Self-pay | Admitting: Internal Medicine

## 2019-12-30 ENCOUNTER — Encounter: Payer: Self-pay | Admitting: Internal Medicine

## 2019-12-30 DIAGNOSIS — G5600 Carpal tunnel syndrome, unspecified upper limb: Secondary | ICD-10-CM | POA: Insufficient documentation

## 2019-12-30 NOTE — Telephone Encounter (Signed)
I typically refer to Dr. Amanda Pea in [place] for carpal tunnel I would like her to go there  Is she agreeable?  If not [facility] in [place] would be the next option   TMS

## 2020-01-02 NOTE — Addendum Note (Signed)
Addended by: Quentin Ore on: 01/02/2020 12:43 PM   Modules accepted: Orders

## 2020-01-02 NOTE — Telephone Encounter (Signed)
Patient informed and prefers [facility] in [place].

## 2020-01-04 ENCOUNTER — Telehealth: Payer: Self-pay | Admitting: Neurology

## 2020-01-04 NOTE — Telephone Encounter (Signed)
pt has [facility] faxed the order they will reach out to the patient to schedule and if she gets it scheduled with them it will be covered at 100%.

## 2020-01-06 ENCOUNTER — Encounter: Payer: Self-pay | Admitting: Neurology

## 2020-01-09 ENCOUNTER — Telehealth: Payer: Self-pay

## 2020-01-09 NOTE — Telephone Encounter (Signed)
Received MRI report from 01/06/20 from [facility]. Given to Dr. Frances Furbish for review.

## 2020-01-09 NOTE — Telephone Encounter (Signed)
Pt called back in regards to missed call  

## 2020-01-09 NOTE — Telephone Encounter (Signed)
I called pt and discussed her MRI results and recommendations. Pt verbalized understanding of results. Pt had no questions at this time but was encouraged to call back if questions arise.  

## 2020-01-09 NOTE — Telephone Encounter (Signed)
Patient had a brain MRI with and without contrast at [facility] in [place], [place] on 01/06/2020 and I reviewed the report: Impression: The previously identified few T2 hyperintense foci in the frontal lobes are again noted.  These could be seen in patients with underlying chronic hypertension, migraine, prior infection/inflammation including Lyme disease etc.  There is no conclusive evidence to suggest demyelinating disease based on the MRI findings.  Sphenoid sinus disease.  Please call patient and advise her that her brain MRI with and without contrast shows no new findings compared to her MRI from January 2021.  She has had some nonspecific changes which we can continue to monitor over time, no need for a repeat brain MRI in the immediate future.  She had no abnormal contrast uptake and overall stable findings. At this juncture, I would be happy to see her back as needed.

## 2020-01-09 NOTE — Telephone Encounter (Signed)
I called pt to discuss. No answer, left a message asking her to call me back. 

## 2020-01-13 ENCOUNTER — Ambulatory Visit: Payer: BC Managed Care – PPO | Admitting: Cardiology

## 2020-01-16 ENCOUNTER — Encounter: Payer: Self-pay | Admitting: Internal Medicine

## 2020-01-17 ENCOUNTER — Encounter (HOSPITAL_COMMUNITY): Payer: Self-pay

## 2020-01-17 ENCOUNTER — Telehealth (HOSPITAL_COMMUNITY): Payer: Self-pay | Admitting: Emergency Medicine

## 2020-01-17 ENCOUNTER — Other Ambulatory Visit: Payer: Self-pay

## 2020-01-17 MED ORDER — IVABRADINE HCL 7.5 MG PO TABS: ORAL_TABLET | ORAL | 0 refills | Status: DC

## 2020-01-17 MED ORDER — METOPROLOL TARTRATE 100 MG PO TABS: ORAL_TABLET | ORAL | 0 refills | Status: DC

## 2020-01-17 NOTE — Telephone Encounter (Signed)
Attempted to call patient regarding upcoming cardiac CT appointment. Left message on voicemail with name and callback number Rockwell Alexandria RN Navigator Cardiac Imaging [facility] [phone] Office [phone] Cell   Left detailed message regarding medications that were sent to her pharmacy for tomorrows exam : metoprolol + ivabradine.  Requested call back to confirm receipt of information.

## 2020-01-18 ENCOUNTER — Ambulatory Visit (HOSPITAL_COMMUNITY)
Admission: RE | Admit: 2020-01-18 | Discharge: 2020-01-18 | Disposition: A | Discharge status: Home / Self Care | Payer: BC Managed Care – PPO | Source: Ambulatory Visit | Attending: Cardiology | Admitting: Cardiology

## 2020-01-18 ENCOUNTER — Encounter (HOSPITAL_COMMUNITY): Payer: Self-pay

## 2020-01-18 ENCOUNTER — Other Ambulatory Visit: Payer: Self-pay

## 2020-01-18 NOTE — Progress Notes (Signed)
Pt greeted and ID'd x2 for CTA coronaries, verified PO metoprolol and ivabradine taken 1.5 hours ago.  HR 110's and 140's/80's, pt rested in armchair for additional 0.5 hr for full two hours for PO meds to take effect.  HR con't to be in 100's, Dr. Servando Salina called and appraised of the situation. 5 mg IV metoprolol ordered and if that does not get the HR down, to reschedule.  Patient told of updated plan and she declined to proceed, stating "we did this before and it didn't work, I do not want to hang around". Patient escorted back to the [facility] and heart navigator to be updated.

## 2020-01-19 ENCOUNTER — Telehealth: Payer: Self-pay

## 2020-01-19 DIAGNOSIS — R072 Precordial pain: Secondary | ICD-10-CM

## 2020-01-19 NOTE — Telephone Encounter (Signed)
-----   Message from Debbe Odea, MD sent at 01/19/2020  8:04 AM EDT ----- Garth Schlatter,  Please order a lexiscan myoview for this patient instead.  HR too elevated for CCTA.  Thank you  BA ----- Message ----- From: Lennie Odor, RN Sent: 01/18/2020  10:38 PM EDT To: Debbe Odea, MD  Lorain Childes

## 2020-01-19 NOTE — Telephone Encounter (Signed)
Per Dr. Merita Norton instructions order for lexiscan myoview placed in Epic. Message fwd to scheduling to contact the patient to schedule. Patient will need pre-test instructions once scheduled.

## 2020-01-19 NOTE — Telephone Encounter (Signed)
Attempted to schedule.  LMOV to call office.  ° °

## 2020-01-23 ENCOUNTER — Other Ambulatory Visit: Payer: Self-pay | Admitting: *Deleted

## 2020-01-26 NOTE — Telephone Encounter (Signed)
Kathleen Burke is scheduled for 02/02/20. Preprocedural instructions for Lexiscan sent via MyChart. Patient verbalized understanding of the instructions as listed below.   [facility] MYOVIEW  Your caregiver has ordered a Stress Test with nuclear imaging. The purpose of this test is to evaluate the blood supply to your heart muscle. This procedure is referred to as a "Non-Invasive Stress Test." This is because other than having an IV started in your vein, nothing is inserted or "invades" your body. Cardiac stress tests are done to find areas of poor blood flow to the heart by determining the extent of coronary artery disease (CAD). Some patients exercise on a treadmill, which naturally increases the blood flow to your heart, while others who are  unable to walk on a treadmill due to physical limitations have a pharmacologic/chemical stress agent called Lexiscan . This medicine will mimic walking on a treadmill by temporarily increasing your coronary blood flow.   Please note: these test may take anywhere between 2-4 hours to complete  PLEASE REPORT TO [facility]ICAL [facility] ENTRANCE  THE VOLUNTEERS AT THE FIRST DESK WILL DIRECT YOU WHERE TO GO  Date of Procedure:________04/05/2020 _____________  Arrival Time for Procedure:____08:15 am______________  Instructions regarding medication:   _x_:  Hold other medications as follows:______Hydrochlorothiazide_______  PLEASE NOTIFY THE OFFICE AT LEAST 24 HOURS IN ADVANCE IF YOU ARE UNABLE TO KEEP YOUR APPOINTMENT.  [phone] AND  PLEASE NOTIFY NUCLEAR MEDICINE AT [facility] AT LEAST 24 HOURS IN ADVANCE IF YOU ARE UNABLE TO KEEP YOUR APPOINTMENT. [phone]  How to prepare for your Myoview test:  1. Do not eat or drink after midnight 2. No caffeine for 24 hours prior to test 3. No smoking 24 hours prior to test. 4. Your medication may be taken with water.  If your doctor stopped a medication because of this test, do not take that medication. 5. Ladies, please do not  wear dresses.  Skirts or pants are appropriate. Please wear a short sleeve shirt. 6. No perfume, cologne or lotion. 7. Wear comfortable walking shoes. No heels!

## 2020-01-30 ENCOUNTER — Ambulatory Visit: Payer: BC Managed Care – PPO | Admitting: Cardiology

## 2020-02-02 ENCOUNTER — Other Ambulatory Visit: Payer: Self-pay

## 2020-02-02 ENCOUNTER — Encounter
Admission: RE | Admit: 2020-02-02 | Discharge: 2020-02-02 | Disposition: A | Discharge status: Home / Self Care | Payer: BC Managed Care – PPO | Source: Ambulatory Visit | Attending: Cardiology | Admitting: Cardiology

## 2020-02-02 DIAGNOSIS — R072 Precordial pain: Secondary | ICD-10-CM | POA: Insufficient documentation

## 2020-02-02 MED ORDER — REGADENOSON 0.4 MG/5ML IV SOLN
0.4000 mg | Freq: Once | INTRAVENOUS | Status: AC
  Administered 2020-02-02: 0.4 mg via INTRAVENOUS
  Filled 2020-02-02: qty 5

## 2020-02-02 MED ORDER — TECHNETIUM TC 99M TETROFOSMIN IV KIT
30.0000 | PACK | Freq: Once | INTRAVENOUS | Status: AC | PRN
  Administered 2020-02-02: 33.018 via INTRAVENOUS

## 2020-02-02 MED ORDER — TECHNETIUM TC 99M TETROFOSMIN IV KIT
10.0000 | PACK | Freq: Once | INTRAVENOUS | Status: AC | PRN
  Administered 2020-02-02: 10.653 via INTRAVENOUS

## 2020-02-03 LAB — NM MYOCAR MULTI W/SPECT W/WALL MOTION / EF
LV dias vol: 96 mL (ref 46–106)
LV sys vol: 39 mL
Peak HR: 139 {beats}/min
Percent HR: 79 %
Rest HR: 100 {beats}/min
SDS: 2
SRS: 6
SSS: 5
TID: 0.86

## 2020-02-06 ENCOUNTER — Other Ambulatory Visit: Payer: Self-pay

## 2020-02-06 ENCOUNTER — Ambulatory Visit (INDEPENDENT_AMBULATORY_CARE_PROVIDER_SITE_OTHER): Payer: BC Managed Care – PPO | Admitting: Cardiology

## 2020-02-06 ENCOUNTER — Encounter: Payer: Self-pay | Admitting: Cardiology

## 2020-02-06 VITALS — BP 136/88 | HR 97 | Ht 66.0 in | Wt 242.0 lb

## 2020-02-06 DIAGNOSIS — Z6839 Body mass index (BMI) 39.0-39.9, adult: Secondary | ICD-10-CM | POA: Diagnosis not present

## 2020-02-06 DIAGNOSIS — R072 Precordial pain: Secondary | ICD-10-CM

## 2020-02-06 DIAGNOSIS — I10 Essential (primary) hypertension: Secondary | ICD-10-CM

## 2020-02-06 NOTE — Patient Instructions (Signed)
Medication Instructions:  Your physician recommends that you continue on your current medications as directed. Please refer to the Current Medication list given to you today.  *If you need a refill on your cardiac medications before your next appointment, please call your pharmacy*  Follow-Up: At [facility], you and your health needs are our priority.  As part of our continuing mission to provide you with exceptional heart care, we have created designated Provider Care Teams.  These Care Teams include your primary Cardiologist (physician) and Advanced Practice Providers (APPs -  Physician Assistants and Nurse Practitioners) who all work together to provide you with the care you need, when you need it.  We recommend signing up for the patient portal called "MyChart".  Sign up information is provided on this After Visit Summary.  MyChart is used to connect with patients for Virtual Visits (Telemedicine).  Patients are able to view lab/test results, encounter notes, upcoming appointments, etc.  Non-urgent messages can be sent to your provider as well.   To learn more about what you can do with MyChart, go to [email or web address].    Your next appointment:   As needed.  The format for your next appointment:   In Person  Provider:   Brian Agbor-Etang, MD  

## 2020-02-06 NOTE — Progress Notes (Signed)
Cardiology Office Note:    Date:  02/06/2020   ID:  Kathleen Burke, DOB Mar 22, 1976, MRN [id number]  PCP:  [name], [name], MD  Cardiologist:  Kate Sable, MD  Electrophysiologist:  None   Referring MD: [name], Olivia Mackie *   Chief Complaint  Patient presents with  . Other    2 month follow up post ECHO. Meds reviewed verbally with patient.      History of Present Illness:    Kathleen Burke is a 44 y.o. female with a hx of hypertension, prediabetic, obesity who presents for follow-up.  She was last seen for 78-monthhistory of shortness of breath and chest pain with exertion.  Due to risk factors, echocardiogram and stress test was ordered.  Coronary CTA was ordered but difficult to obtain due to elevated heart rates.  Lexiscan myocardial perfusion imaging stress test was then ordered.  Patient now presents for results.  Doing okay.  No concerns at this time.   Past Medical History:  Diagnosis Date  . Anemia   . Anxiety 2014  . Arthritis    right arm, right knee  . Chicken pox   . Depression   . Frequent headaches   . GERD (gastroesophageal reflux disease)   . Hyperlipemia   . Hypertension   . Obesity (BMI 30-39.9)   . Pre-diabetes   . Sleep apnea    CPAP  . Trigger finger    MIDDLE FINGER RIGHT HAND SURGERY ON JULY 31ST 2019    Past Surgical History:  Procedure Laterality Date  . COLONOSCOPY WITH PROPOFOL N/A 07/18/2019   Procedure: COLONOSCOPY WITH PROPOFOL;  Surgeon: WLucilla Lame MD;  Location: [facility]  Service: Endoscopy;  Laterality: N/A;  Latex Sleep apnea  . HYSTEROSCOPY WITH D & C N/A 05/20/2018   Procedure: DILATATION AND CURETTAGE /HYSTEROSCOPY;  Surgeon: SMalachy Mood MD;  Location: [facility];  Service: Gynecology;  Laterality: N/A;  . INTRAUTERINE DEVICE (IUD) INSERTION N/A 05/20/2018   Procedure: INTRAUTERINE DEVICE (IUD) INSERTION;  Surgeon: SMalachy Mood MD;  Location: [facility] OR[facility];  Service: Gynecology;  Laterality:  N/A;  . WISDOM TOOTH EXTRACTION      Current Medications: Current Meds  Medication Sig  . ACCU-CHEK GUIDE test strip   . blood glucose meter kit and supplies Dispense based on patient and insurance preference. Use up to four times daily as directed. (FOR ICD-10 E10.9, E11.9).  .Marland KitchenCRANBERRY PO Take by mouth daily.  . Cyanocobalamin (B-12) 1000 MCG TABS daily.   . diclofenac sodium (VOLTAREN) 1 % GEL Apply 2 g topically 4 (four) times daily. (Patient taking differently: Apply 2 g topically 4 (four) times daily as needed. )  . docusate sodium (COLACE) 100 MG capsule Take 100 mg by mouth daily.  .Marland KitchenELDERBERRY PO Take by mouth 2 (two) times daily. Gummies  . ferrous sulfate 325 (65 FE) MG tablet Take 325 mg by mouth daily with breakfast.  . hydrochlorothiazide (HYDRODIURIL) 25 MG tablet 1 tab daily by mouth  . hydrocortisone 2.5 % cream Apply topically 2 (two) times daily. As needed neck, face  . ivabradine (CORLANOR) 7.5 MG TABS tablet Take 1 tablet by mouth 2 hours prior to Coronary CT  . ketoconazole (NIZORAL) 2 % cream daily as needed.   . metoprolol succinate (TOPROL-XL) 25 MG 24 hr tablet Take 1 tablet (25 mg total) by mouth daily.  . metoprolol tartrate (LOPRESSOR) 100 MG tablet Take 1 tablet by mouth  2 hours prior to Coronary CT.  .Marland Kitchen  Multiple Vitamins-Minerals (MULTIVITAMIN PO) Take 1 packet by mouth See admin instructions. [name] Supplement Multivitamin Packet - Take 6 tablets by mouth in the morning and take 7 tablets by mouth at bedtime  . naproxen sodium (ALEVE) 220 MG tablet Take 220 mg by mouth daily as needed.  Marland Kitchen olmesartan (BENICAR) 20 MG tablet Take 1 tablet (20 mg total) by mouth daily.  . SUPER B COMPLEX/C PO   . triamcinolone cream (KENALOG) 0.1 % Apply 1 application topically 2 (two) times daily. (Patient taking differently: Apply 1 application topically 2 (two) times daily as needed. )  . TURMERIC PO Take 500 mg by mouth daily. With ginger  . venlafaxine XR ([name])  37.5 MG 24 hr capsule Take 1 capsule (37.5 mg total) by mouth daily.  Marland Kitchen VITAMIN D, ERGOCALCIFEROL, PO Take 5,000 Units by mouth daily.     Allergies:   Latex   Social History   Socioeconomic History  . Marital status: Married    Spouse name: Not on file  . Number of children: 2  . Years of education: Not on file  . Highest education level: Not on file  Occupational History  . Occupation: Therapist, sports  Tobacco Use  . Smoking status: Never Smoker  . Smokeless tobacco: Never Used  Substance and Sexual Activity  . Alcohol use: Yes    Alcohol/week: 2.0 standard drinks    Types: 2 Glasses of wine per week    Comment: OCC-WEEKENDS  . Drug use: No  . Sexual activity: Yes    Partners: Male    Birth control/protection: Surgical    Comment: vasectomy  Other Topics Concern  . Not on file  Social History Narrative   Married    2 kids    Works occup. [employer]    Social Determinants of Health   Financial Resource Strain:   . Difficulty of Paying Living Expenses:   Food Insecurity:   . Worried About Charity fundraiser in the Last Year:   . Arboriculturist in the Last Year:   Transportation Needs:   . Film/video editor (Medical):   Marland Kitchen Lack of Transportation (Non-Medical):   Physical Activity:   . Days of Exercise per Week:   . Minutes of Exercise per Session:   Stress:   . Feeling of Stress :   Social Connections:   . Frequency of Communication with Friends and Family:   . Frequency of Social Gatherings with Friends and Family:   . Attends Religious Services:   . Active Member of Clubs or Organizations:   . Attends Archivist Meetings:   Marland Kitchen Marital Status:      Family History: The patient's family history includes Arthritis in her mother, paternal grandfather, and paternal grandmother; Breast cancer (age of onset: 59) in her maternal grandmother; Diabetes Mellitus I in her father; Diabetes Mellitus II in her paternal grandmother, sister, and sister; Hyperlipidemia  in her mother, paternal grandfather, and sister; Hypertension in her mother, paternal grandfather, and sister; Stroke in her paternal grandfather.  ROS:   Please see the history of present illness.     All other systems reviewed and are negative.  EKGs/Labs/Other Studies Reviewed:    The following studies were reviewed today:   EKG:  EKG is not ordered today.    Recent Labs: 06/16/2019: TSH 1.300 09/19/2019: ALT 25; Hemoglobin 11.6; Platelets 344.0 09/28/2019: NT-Pro BNP 6 12/16/2019: BUN 12; Creatinine, Ser 0.76; Potassium 3.4; Sodium 137  Recent Lipid Panel  Component Value Date/Time   CHOL 186 09/19/2019 0912   CHOL 215 (H) 06/16/2019 0000   TRIG 52.0 09/19/2019 0912   HDL 64.20 09/19/2019 0912   HDL 72 06/16/2019 0000   CHOLHDL 3 09/19/2019 0912   VLDL 10.4 09/19/2019 0912   LDLCALC 111 (H) 09/19/2019 0912   LDLCALC 134 (H) 06/16/2019 0000   LDLCALC 121 (H) 03/17/2018 1036     Physical Exam:    VS:  BP 136/88 (BP Location: Left Arm, Patient Position: Sitting, Cuff Size: Normal)   Pulse 97   Ht '5\' 6"'  (1.676 m)   Wt 242 lb (109.8 kg)   SpO2 98%   BMI 39.06 kg/m     Wt Readings from Last 3 Encounters:  02/06/20 242 lb (109.8 kg)  12/28/19 243 lb (110.2 kg)  11/15/19 245 lb (111.1 kg)     GEN:  Well nourished, well developed in no acute distress HEENT: Normal NECK: No JVD; No carotid bruits LYMPHATICS: No lymphadenopathy CARDIAC: RRR, no murmurs, rubs, gallops RESPIRATORY:  Clear to auscultation without rales, wheezing or rhonchi  ABDOMEN: Soft, non-tender, non-distended MUSCULOSKELETAL:  No edema; No deformity  SKIN: Warm and dry NEUROLOGIC:  Alert and oriented x 3 PSYCHIATRIC:  Normal affect   ASSESSMENT:    1. Precordial pain   2. Essential hypertension   3. BMI 39.0-39.9,adult    PLAN:    In order of problems listed above:  1. Patient describes angina-like chest discomfort.  She has risk factors of obesity, hypertension, prediabetic.   Echocardiogram showed normal systolic function with EF 60 to 65%.  Impaired relaxation.  Lexiscan pharmacologic stress test did not show any evidence for ischemia, low risk scan.  Patient made aware of results.  Patient was reassured. 2. History of hypertension, blood  controlled.  Continue current BP meds for now. 3. Obese.  Weight loss advised.  Follow-up as needed  This note was generated in part or whole with voice recognition software. Voice recognition is usually quite accurate but there are transcription errors that can and very often do occur. I apologize for any typographical errors that were not detected and corrected.  Medication Adjustments/Labs and Tests Ordered: Current medicines are reviewed at length with the patient today.  Concerns regarding medicines are outlined above.  No orders of the defined types were placed in this encounter.  No orders of the defined types were placed in this encounter.   Patient Instructions  Medication Instructions:  Your physician recommends that you continue on your current medications as directed. Please refer to the Current Medication list given to you today.  *If you need a refill on your cardiac medications before your next appointment, please call your pharmacy*  Follow-Up: At [facility], you and your health needs are our priority.  As part of our continuing mission to provide you with exceptional heart care, we have created designated Provider Care Teams.  These Care Teams include your primary Cardiologist (physician) and Advanced Practice Providers (APPs -  Physician Assistants and Nurse Practitioners) who all work together to provide you with the care you need, when you need it.  We recommend signing up for the patient portal called "MyChart".  Sign up information is provided on this After Visit Summary.  MyChart is used to connect with patients for Virtual Visits (Telemedicine).  Patients are able to view lab/test results, encounter  notes, upcoming appointments, etc.  Non-urgent messages can be sent to your provider as well.   To learn more about  what you can do with MyChart, go to [email or web address].    Your next appointment:   As needed.  The format for your next appointment:   In Person  Provider:   Kate Sable, MD    Signed, Kate Sable, MD  02/06/2020 9:57 AM    [place]

## 2020-03-19 ENCOUNTER — Other Ambulatory Visit (INDEPENDENT_AMBULATORY_CARE_PROVIDER_SITE_OTHER): Payer: BC Managed Care – PPO

## 2020-03-19 ENCOUNTER — Other Ambulatory Visit: Payer: Self-pay

## 2020-03-19 DIAGNOSIS — E119 Type 2 diabetes mellitus without complications: Secondary | ICD-10-CM

## 2020-03-19 DIAGNOSIS — I10 Essential (primary) hypertension: Secondary | ICD-10-CM | POA: Diagnosis not present

## 2020-03-19 LAB — LIPID PANEL
Cholesterol: 177 mg/dL (ref 0–200)
HDL: 54 mg/dL (ref >39.00)
LDL Cholesterol: 113 mg/dL — ABNORMAL HIGH (ref 0–99)
NonHDL: 123.23
Total CHOL/HDL Ratio: 3
Triglycerides: 53 mg/dL (ref 0.0–149.0)
VLDL: 10.6 mg/dL (ref 0.0–40.0)

## 2020-03-19 LAB — CBC WITH DIFFERENTIAL/PLATELET
Basophils Absolute: 0 10*3/uL (ref 0.0–0.1)
Basophils Relative: 0.3 % (ref 0.0–3.0)
Eosinophils Absolute: 0.1 10*3/uL (ref 0.0–0.7)
Eosinophils Relative: 1.6 % (ref 0.0–5.0)
HCT: 32.9 % — ABNORMAL LOW (ref 36.0–46.0)
Hemoglobin: 11.3 g/dL — ABNORMAL LOW (ref 12.0–15.0)
Lymphocytes Relative: 25.6 % (ref 12.0–46.0)
Lymphs Abs: 1.6 10*3/uL (ref 0.7–4.0)
MCHC: 34.3 g/dL (ref 30.0–36.0)
MCV: 88.2 fl (ref 78.0–100.0)
Monocytes Absolute: 0.5 10*3/uL (ref 0.1–1.0)
Monocytes Relative: 7.4 % (ref 3.0–12.0)
Neutro Abs: 4.1 10*3/uL (ref 1.4–7.7)
Neutrophils Relative %: 65.1 % (ref 43.0–77.0)
Platelets: 320 10*3/uL (ref 150.0–400.0)
RBC: 3.73 Mil/uL — ABNORMAL LOW (ref 3.87–5.11)
RDW: 14.3 % (ref 11.5–15.5)
WBC: 6.4 10*3/uL (ref 4.0–10.5)

## 2020-03-19 LAB — COMPREHENSIVE METABOLIC PANEL
ALT: 14 U/L (ref 0–35)
AST: 16 U/L (ref 0–37)
Albumin: 4.3 g/dL (ref 3.5–5.2)
Alkaline Phosphatase: 72 U/L (ref 39–117)
BUN: 14 mg/dL (ref 6–23)
CO2: 29 mEq/L (ref 19–32)
Calcium: 9.1 mg/dL (ref 8.4–10.5)
Chloride: 99 mEq/L (ref 96–112)
Creatinine, Ser: 0.72 mg/dL (ref 0.40–1.20)
GFR: 105.53 mL/min (ref >60.00)
Glucose, Bld: 107 mg/dL — ABNORMAL HIGH (ref 70–99)
Potassium: 3.7 mEq/L (ref 3.5–5.1)
Sodium: 137 mEq/L (ref 135–145)
Total Bilirubin: 0.3 mg/dL (ref 0.2–1.2)
Total Protein: 6.8 g/dL (ref 6.0–8.3)

## 2020-03-19 LAB — HEMOGLOBIN A1C: Hgb A1c MFr Bld: 6.3 % (ref 4.6–6.5)

## 2020-03-20 ENCOUNTER — Other Ambulatory Visit: Payer: Self-pay

## 2020-03-22 ENCOUNTER — Ambulatory Visit: Payer: BC Managed Care – PPO | Admitting: Internal Medicine

## 2020-03-22 ENCOUNTER — Encounter: Payer: Self-pay | Admitting: Internal Medicine

## 2020-03-22 ENCOUNTER — Other Ambulatory Visit: Payer: Self-pay

## 2020-03-22 VITALS — BP 124/82 | HR 89 | Temp 97.6°F | Ht 66.0 in | Wt 241.0 lb

## 2020-03-22 DIAGNOSIS — F40243 Fear of flying: Secondary | ICD-10-CM | POA: Diagnosis not present

## 2020-03-22 DIAGNOSIS — E119 Type 2 diabetes mellitus without complications: Secondary | ICD-10-CM | POA: Diagnosis not present

## 2020-03-22 DIAGNOSIS — F419 Anxiety disorder, unspecified: Secondary | ICD-10-CM | POA: Insufficient documentation

## 2020-03-22 DIAGNOSIS — Z1231 Encounter for screening mammogram for malignant neoplasm of breast: Secondary | ICD-10-CM

## 2020-03-22 DIAGNOSIS — Z0000 Encounter for general adult medical examination without abnormal findings: Secondary | ICD-10-CM | POA: Diagnosis not present

## 2020-03-22 DIAGNOSIS — R7303 Prediabetes: Secondary | ICD-10-CM | POA: Insufficient documentation

## 2020-03-22 DIAGNOSIS — Z1389 Encounter for screening for other disorder: Secondary | ICD-10-CM

## 2020-03-22 DIAGNOSIS — I10 Essential (primary) hypertension: Secondary | ICD-10-CM | POA: Diagnosis not present

## 2020-03-22 DIAGNOSIS — E669 Obesity, unspecified: Secondary | ICD-10-CM

## 2020-03-22 DIAGNOSIS — E559 Vitamin D deficiency, unspecified: Secondary | ICD-10-CM

## 2020-03-22 DIAGNOSIS — Z23 Encounter for immunization: Secondary | ICD-10-CM

## 2020-03-22 MED ORDER — ALPRAZOLAM 0.5 MG PO TABS: 0.5000 mg | ORAL_TABLET | Freq: Every day | ORAL | 0 refills | Status: DC | PRN

## 2020-03-22 MED ORDER — OLMESARTAN MEDOXOMIL 20 MG PO TABS: 20.0000 mg | ORAL_TABLET | Freq: Every day | ORAL | 3 refills | Status: DC

## 2020-03-22 MED ORDER — ACCU-CHEK GUIDE VI STRP: ORAL_STRIP | 3 refills | Status: DC

## 2020-03-22 MED ORDER — HYDROCHLOROTHIAZIDE 25 MG PO TABS: ORAL_TABLET | ORAL | 3 refills | Status: DC

## 2020-03-22 MED ORDER — TETANUS-DIPHTH-ACELL PERTUSSIS 5-2.5-18.5 LF-MCG/0.5 IM SUSP: 0.5000 mL | Freq: Once | INTRAMUSCULAR | 0 refills | Status: AC

## 2020-03-22 MED ORDER — ACCU-CHEK SOFT TOUCH LANCETS MISC: 12 refills | Status: DC

## 2020-03-22 NOTE — Patient Instructions (Addendum)
Tdap due call back nurse visit after 04/01/20    Debrox ear wax drops  06/16/20 [place] when mammo due call to schedule please  Fasting in 6 months  High Cholesterol  High cholesterol is a condition in which the blood has high levels of a white, waxy, fat-like substance (cholesterol). The human body needs small amounts of cholesterol. The liver makes all the cholesterol that the body needs. Extra (excess) cholesterol comes from the food that we eat. Cholesterol is carried from the liver by the blood through the blood vessels. If you have high cholesterol, deposits (plaques) may build up on the walls of your blood vessels (arteries). Plaques make the arteries narrower and stiffer. Cholesterol plaques increase your risk for heart attack and stroke. Work with your health care provider to keep your cholesterol levels in a healthy range. What increases the risk? This condition is more likely to develop in people who:  Eat foods that are high in animal fat (saturated fat) or cholesterol.  Are overweight.  Are not getting enough exercise.  Have a family history of high cholesterol. What are the signs or symptoms? There are no symptoms of this condition. How is this diagnosed? This condition may be diagnosed from the results of a blood test.  If you are older than age 61, your health care provider may check your cholesterol every 4-6 years.  You may be checked more often if you already have high cholesterol or other risk factors for heart disease. The blood test for cholesterol measures:  "Bad" cholesterol (LDL cholesterol). This is the main type of cholesterol that causes heart disease. The desired level for LDL is less than 100.  "Good" cholesterol (HDL cholesterol). This type helps to protect against heart disease by cleaning the arteries and carrying the LDL away. The desired level for HDL is 60 or higher.  Triglycerides. These are fats that the body can store or burn for energy. The  desired number for triglycerides is lower than 150.  Total cholesterol. This is a measure of the total amount of cholesterol in your blood, including LDL cholesterol, HDL cholesterol, and triglycerides. A healthy number is less than 200. How is this treated? This condition is treated with diet changes, lifestyle changes, and medicines. Diet changes  This may include eating more whole grains, fruits, vegetables, nuts, and fish.  This may also include cutting back on red meat and foods that have a lot of added sugar. Lifestyle changes  Changes may include getting at least 40 minutes of aerobic exercise 3 times a week. Aerobic exercises include walking, biking, and swimming. Aerobic exercise along with a healthy diet can help you maintain a healthy weight.  Changes may also include quitting smoking. Medicines  Medicines are usually given if diet and lifestyle changes have failed to reduce your cholesterol to healthy levels.  Your health care provider may prescribe a statin medicine. Statin medicines have been shown to reduce cholesterol, which can reduce the risk of heart disease. Follow these instructions at home: Eating and drinking If told by your health care provider:  Eat chicken (without skin), fish, veal, shellfish, ground [place] breast, and round or loin cuts of red meat.  Do not eat fried foods or fatty meats, such as hot dogs and salami.  Eat plenty of fruits, such as apples.  Eat plenty of vegetables, such as broccoli, potatoes, and carrots.  Eat beans, peas, and lentils.  Eat grains such as barley, rice, couscous, and bulgur wheat.  Eat  pasta without cream sauces.  Use skim or nonfat milk, and eat low-fat or nonfat yogurt and cheeses.  Do not eat or drink whole milk, cream, ice cream, egg yolks, or hard cheeses.  Do not eat stick margarine or tub margarines that contain trans fats (also called partially hydrogenated oils).  Do not eat saturated tropical oils, such  as coconut oil and palm oil.  Do not eat cakes, cookies, crackers, or other baked goods that contain trans fats.  General instructions  Exercise as directed by your health care provider. Increase your activity level with activities such as gardening, walking, and taking the stairs.  Take over-the-counter and prescription medicines only as told by your health care provider.  Do not use any products that contain nicotine or tobacco, such as cigarettes and e-cigarettes. If you need help quitting, ask your health care provider.  Keep all follow-up visits as told by your health care provider. This is important. Contact a health care provider if:  You are struggling to maintain a healthy diet or weight.  You need help to start on an exercise program.  You need help to stop smoking. Get help right away if:  You have chest pain.  You have trouble breathing. This information is not intended to replace advice given to you by your health care provider. Make sure you discuss any questions you have with your health care provider. Document Revised: 10/16/2017 Document Reviewed: 04/12/2016 Elsevier Patient Education  [place].  Cholesterol Content in Foods Cholesterol is a waxy, fat-like substance that helps to carry fat in the blood. The body needs cholesterol in small amounts, but too much cholesterol can cause damage to the arteries and heart. Most people should eat less than 200 milligrams (mg) of cholesterol a day. Foods with cholesterol  Cholesterol is found in animal-based foods, such as meat, seafood, and dairy. Generally, low-fat dairy and lean meats have less cholesterol than full-fat dairy and fatty meats. The milligrams of cholesterol per serving (mg per serving) of common cholesterol-containing foods are listed below. Meat and other proteins  Egg -- one large whole egg has 186 mg.  Veal shank -- 4 oz has 141 mg.  Lean ground [place] (93% lean) -- 4 oz has 118  mg.  Fat-trimmed lamb loin -- 4 oz has 106 mg.  Lean ground beef (90% lean) -- 4 oz has 100 mg.  Lobster -- 3.5 oz has 90 mg.  Pork loin chops -- 4 oz has 86 mg.  Canned salmon -- 3.5 oz has 83 mg.  Fat-trimmed beef top loin -- 4 oz has 78 mg.  Frankfurter -- 1 frank (3.5 oz) has 77 mg.  Crab -- 3.5 oz has 71 mg.  Roasted chicken without skin, white meat -- 4 oz has 66 mg.  Light bologna -- 2 oz has 45 mg.  Deli-cut [place] -- 2 oz has 31 mg.  Canned tuna -- 3.5 oz has 31 mg.  Berniece Salines -- 1 oz has 29 mg.  Oysters and mussels (raw) -- 3.5 oz has 25 mg.  Mackerel -- 1 oz has 22 mg.  Trout -- 1 oz has 20 mg.  Pork sausage -- 1 link (1 oz) has 17 mg.  Salmon -- 1 oz has 16 mg.  Tilapia -- 1 oz has 14 mg. Dairy  Soft-serve ice cream --  cup (4 oz) has 103 mg.  Whole-milk yogurt -- 1 cup (8 oz) has 29 mg.  Cheddar cheese -- 1 oz has 28  mg.  American cheese -- 1 oz has 28 mg.  Whole milk -- 1 cup (8 oz) has 23 mg.  2% milk -- 1 cup (8 oz) has 18 mg.  Cream cheese -- 1 tablespoon (Tbsp) has 15 mg.  Cottage cheese --  cup (4 oz) has 14 mg.  Low-fat (1%) milk -- 1 cup (8 oz) has 10 mg.  Sour cream -- 1 Tbsp has 8.5 mg.  Low-fat yogurt -- 1 cup (8 oz) has 8 mg.  Nonfat Greek yogurt -- 1 cup (8 oz) has 7 mg.  Half-and-half cream -- 1 Tbsp has 5 mg. Fats and oils  Cod liver oil -- 1 tablespoon (Tbsp) has 82 mg.  Butter -- 1 Tbsp has 15 mg.  Lard -- 1 Tbsp has 14 mg.  Bacon grease -- 1 Tbsp has 14 mg.  Mayonnaise -- 1 Tbsp has 5-10 mg.  Margarine -- 1 Tbsp has 3-10 mg. Exact amounts of cholesterol in these foods may vary depending on specific ingredients and brands. Foods without cholesterol Most plant-based foods do not have cholesterol unless you combine them with a food that has cholesterol. Foods without cholesterol include:  Grains and cereals.  Vegetables.  Fruits.  Vegetable oils, such as olive, canola, and sunflower oil.  Legumes,  such as peas, beans, and lentils.  Nuts and seeds.  Egg whites. Summary  The body needs cholesterol in small amounts, but too much cholesterol can cause damage to the arteries and heart.  Most people should eat less than 200 milligrams (mg) of cholesterol a day. This information is not intended to replace advice given to you by your health care provider. Make sure you discuss any questions you have with your health care provider. Document Revised: 09/25/2017 Document Reviewed: 06/09/2017 Elsevier Patient Education  2020 [employer].

## 2020-03-22 NOTE — Progress Notes (Signed)
Chief Complaint  Patient presents with  . Follow-up   Annual  1. CTS hands has not seen [facility] for surgery given # today  2. HTN controlled on hctz 25 and benicar 20 mg qd NST cardiac w/u neg 02/02/20 BP at home running 117/78  3. DM 2 with htn now prediabetic and wants refill of accucheck strips and lancets  4. Anxiety with flying needs refill of xanax 0.5 prn upcoming trip to [facility]   Review of Systems  Constitutional: Negative for weight loss.  HENT: Negative for hearing loss.   Eyes: Negative for blurred vision.  Respiratory: Negative for shortness of breath.   Cardiovascular: Negative for chest pain.  Gastrointestinal: Negative for abdominal pain.  Musculoskeletal: Negative for falls.  Skin: Negative for rash.  Neurological: Negative for headaches.  Psychiatric/Behavioral: Negative for depression. The patient is nervous/anxious.    Past Medical History:  Diagnosis Date  . Anemia   . Anxiety 2014  . Arthritis    right arm, right knee  . Chicken pox   . Depression   . Frequent headaches   . GERD (gastroesophageal reflux disease)   . Hyperlipemia   . Hypertension   . Obesity (BMI 30-39.9)   . Pre-diabetes   . Sleep apnea    CPAP  . Trigger finger    MIDDLE FINGER RIGHT HAND SURGERY ON JULY 31ST 2019   Past Surgical History:  Procedure Laterality Date  . COLONOSCOPY WITH PROPOFOL N/A 07/18/2019   Procedure: COLONOSCOPY WITH PROPOFOL;  Surgeon: [name], MD;  Location: [place];  Service: Endoscopy;  Laterality: N/A;  Latex Sleep apnea  . HYSTEROSCOPY WITH D & C N/A 05/20/2018   Procedure: DILATATION AND CURETTAGE /HYSTEROSCOPY;  Surgeon: [name], MD;  Location: [facility];  Service: Gynecology;  Laterality: N/A;  . INTRAUTERINE DEVICE (IUD) INSERTION N/A 05/20/2018   Procedure: INTRAUTERINE DEVICE (IUD) INSERTION;  Surgeon: [name], MD;  Location: [facility];  Service: Gynecology;  Laterality: N/A;  . WISDOM TOOTH EXTRACTION     Family  History  Problem Relation Age of Onset  . Arthritis Mother   . Hyperlipidemia Mother   . Hypertension Mother   . Diabetes Mellitus I Father        DM I  . Breast cancer Maternal Grandmother 14  . Arthritis Paternal Grandmother   . Diabetes Mellitus II Paternal Grandmother   . Arthritis Paternal Grandfather   . Hyperlipidemia Paternal Grandfather   . Stroke Paternal Grandfather   . Hypertension Paternal Grandfather   . Diabetes Mellitus II Sister   . Diabetes Mellitus II Sister   . Hyperlipidemia Sister   . Hypertension Sister    Social History   Socioeconomic History  . Marital status: Married    Spouse name: Not on file  . Number of children: 2  . Years of education: Not on file  . Highest education level: Not on file  Occupational History  . Occupation: Therapist, sports  Tobacco Use  . Smoking status: Never Smoker  . Smokeless tobacco: Never Used  Substance and Sexual Activity  . Alcohol use: Yes    Alcohol/week: 2.0 standard drinks    Types: 2 Glasses of wine per week    Comment: OCC-WEEKENDS  . Drug use: No  . Sexual activity: Yes    Partners: Male    Birth control/protection: Surgical    Comment: vasectomy  Other Topics Concern  . Not on file  Social History Narrative   Married    2  kids    Works [employer]    Social Determinants of Health   Financial Resource Strain:   . Difficulty of Paying Living Expenses:   Food Insecurity:   . Worried About Charity fundraiser in the Last Year:   . Arboriculturist in the Last Year:   Transportation Needs:   . Film/video editor (Medical):   Marland Kitchen Lack of Transportation (Non-Medical):   Physical Activity:   . Days of Exercise per Week:   . Minutes of Exercise per Session:   Stress:   . Feeling of Stress :   Social Connections:   . Frequency of Communication with Friends and Family:   . Frequency of Social Gatherings with Friends and Family:   . Attends Religious Services:   . Active Member of Clubs or  Organizations:   . Attends Archivist Meetings:   Marland Kitchen Marital Status:   Intimate Partner Violence:   . Fear of Current or Ex-Partner:   . Emotionally Abused:   Marland Kitchen Physically Abused:   . Sexually Abused:    Current Meds  Medication Sig  . ACCU-CHEK GUIDE test strip Qd E 11.9 accucheck test strips  . blood glucose meter kit and supplies Dispense based on patient and insurance preference. Use up to four times daily as directed. (FOR ICD-10 E10.9, E11.9).  Marland Kitchen docusate sodium (COLACE) 100 MG capsule Take 100 mg by mouth daily.  Marland Kitchen ELDERBERRY PO Take by mouth 2 (two) times daily. Gummies  . ferrous sulfate 325 (65 FE) MG tablet Take 325 mg by mouth daily with breakfast.  . hydrochlorothiazide (HYDRODIURIL) 25 MG tablet 1 tab daily by mouth  . metoprolol succinate (TOPROL-XL) 25 MG 24 hr tablet Take 1 tablet (25 mg total) by mouth daily.  Marland Kitchen olmesartan (BENICAR) 20 MG tablet Take 1 tablet (20 mg total) by mouth daily.  . SUPER B COMPLEX/C PO   . TURMERIC PO Take 500 mg by mouth daily. With ginger  . VITAMIN D, ERGOCALCIFEROL, PO Take 5,000 Units by mouth daily.  . [DISCONTINUED] ACCU-CHEK GUIDE test strip   . [DISCONTINUED] hydrochlorothiazide (HYDRODIURIL) 25 MG tablet 1 tab daily by mouth  . [DISCONTINUED] olmesartan (BENICAR) 20 MG tablet Take 1 tablet (20 mg total) by mouth daily.   Allergies  Allergen Reactions  . Latex Itching    GLOVES (extended exposure)   Recent Results (from the past 2160 hour(s))  NM Myocar Multi W/Spect W/Wall Motion / EF     Status: None   Collection Time: 02/02/20 11:05 AM  Result Value Ref Range   Rest HR 100 bpm   Rest BP 145/86 mmHg   Peak BP 156/78 mmHg   Percent HR 79 %   Peak HR 139 bpm   SSS 5    SRS 6    SDS 2    TID 0.86    LV sys vol 39 mL   LV dias vol 96 46 - 106 mL  CBC with Differential/Platelet     Status: Abnormal   Collection Time: 03/19/20  8:30 AM  Result Value Ref Range   WBC 6.4 4.0 - 10.5 K/uL   RBC 3.73 (L) 3.87 -  5.11 Mil/uL   Hemoglobin 11.3 (L) 12.0 - 15.0 g/dL   HCT 32.9 (L) 36.0 - 46.0 %   MCV 88.2 78.0 - 100.0 fl   MCHC 34.3 30.0 - 36.0 g/dL   RDW 14.3 11.5 - 15.5 %   Platelets 320.0 150.0 -  400.0 K/uL   Neutrophils Relative % 65.1 43.0 - 77.0 %   Lymphocytes Relative 25.6 12.0 - 46.0 %   Monocytes Relative 7.4 3.0 - 12.0 %   Eosinophils Relative 1.6 0.0 - 5.0 %   Basophils Relative 0.3 0.0 - 3.0 %   Neutro Abs 4.1 1.4 - 7.7 K/uL   Lymphs Abs 1.6 0.7 - 4.0 K/uL   Monocytes Absolute 0.5 0.1 - 1.0 K/uL   Eosinophils Absolute 0.1 0.0 - 0.7 K/uL   Basophils Absolute 0.0 0.0 - 0.1 K/uL  HgB A1c     Status: None   Collection Time: 03/19/20  8:30 AM  Result Value Ref Range   Hgb A1c MFr Bld 6.3 4.6 - 6.5 %    Comment: Glycemic Control Guidelines for People with Diabetes:Non Diabetic:  <6%Goal of Therapy: <7%Additional Action Suggested:  >8%   Lipid panel     Status: Abnormal   Collection Time: 03/19/20  8:30 AM  Result Value Ref Range   Cholesterol 177 0 - 200 mg/dL    Comment: ATP III Classification       Desirable:  < 200 mg/dL               Borderline High:  200 - 239 mg/dL          High:  > = 240 mg/dL   Triglycerides 53.0 0.0 - 149.0 mg/dL    Comment: Normal:  <150 mg/dLBorderline High:  150 - 199 mg/dL   HDL 54.00 >39.00 mg/dL   VLDL 10.6 0.0 - 40.0 mg/dL   LDL Cholesterol 113 (H) 0 - 99 mg/dL   Total CHOL/HDL Ratio 3     Comment:                Men          Women1/2 Average Risk     3.4          3.3Average Risk          5.0          4.42X Average Risk          9.6          7.13X Average Risk          15.0          11.0                       NonHDL 123.23     Comment: NOTE:  Non-HDL goal should be 30 mg/dL higher than patient's LDL goal (i.e. LDL goal of < 70 mg/dL, would have non-HDL goal of < 100 mg/dL)  Comprehensive metabolic panel     Status: Abnormal   Collection Time: 03/19/20  8:30 AM  Result Value Ref Range   Sodium 137 135 - 145 mEq/L   Potassium 3.7 3.5 - 5.1 mEq/L    Chloride 99 96 - 112 mEq/L   CO2 29 19 - 32 mEq/L   Glucose, Bld 107 (H) 70 - 99 mg/dL   BUN 14 6 - 23 mg/dL   Creatinine, Ser 0.72 0.40 - 1.20 mg/dL   Total Bilirubin 0.3 0.2 - 1.2 mg/dL   Alkaline Phosphatase 72 39 - 117 U/L   AST 16 0 - 37 U/L   ALT 14 0 - 35 U/L   Total Protein 6.8 6.0 - 8.3 g/dL   Albumin 4.3 3.5 - 5.2 g/dL   GFR 105.53 >60.00 mL/min   Calcium 9.1 8.4 -  10.5 mg/dL   Objective  Body mass index is 38.9 kg/m. Wt Readings from Last 3 Encounters:  03/22/20 241 lb (109.3 kg)  02/06/20 242 lb (109.8 kg)  12/28/19 243 lb (110.2 kg)   Temp Readings from Last 3 Encounters:  03/22/20 97.6 F (36.4 C) (Temporal)  12/28/19 (!) 97.3 F (36.3 C) (Temporal)  11/15/19 98.1 F (36.7 C)   BP Readings from Last 3 Encounters:  03/22/20 124/82  02/06/20 136/88  01/18/20 (!) 147/88   Pulse Readings from Last 3 Encounters:  03/22/20 89  02/06/20 97  12/28/19 (!) 104    Physical Exam Vitals and nursing note reviewed.  Constitutional:      Appearance: Normal appearance. She is well-developed and well-groomed. She is obese.  HENT:     Head: Normocephalic and atraumatic.  Eyes:     Conjunctiva/sclera: Conjunctivae normal.     Pupils: Pupils are equal, round, and reactive to light.  Cardiovascular:     Rate and Rhythm: Normal rate and regular rhythm.     Heart sounds: Normal heart sounds. No murmur.  Pulmonary:     Effort: Pulmonary effort is normal.     Breath sounds: Normal breath sounds.  Skin:    General: Skin is warm and dry.  Neurological:     General: No focal deficit present.     Mental Status: She is alert and oriented to person, place, and time. Mental status is at baseline.     Gait: Gait normal.  Psychiatric:        Attention and Perception: Attention and perception normal.        Mood and Affect: Mood and affect normal.        Speech: Speech normal.        Behavior: Behavior normal. Behavior is cooperative.        Thought Content: Thought  content normal.        Cognition and Memory: Cognition and memory normal.        Judgment: Judgment normal.     Assessment  Plan  Annual physical exam Flu shot utd  utd tdapdue 11/26/2019 Immune hep B and MMR  Pap [facility] 03/23/18 neg no HPV testing done Endometrial Bx 05/20/18 negative with IUD now Has IUD   mammo8/21/20 negativeordered   Colonoscopy 07/18/2019 normal Wohl repeat in 10 years   Declines HIV testing rec healthy diet and exercise  rec take D3 2000 IUD daily   Essential hypertension - Plan: olmesartan (BENICAR) 20 MG tablet, hydrochlorothiazide (HYDRODIURIL) 25 MG tablet [facility]   Type 2 diabetes mellitus without complication, without long-term current use of insulin (HCC) - Plan: ACCU-CHEK GUIDE test strip checking qd now with prediabetes  Anxiety with flying - Plan: ALPRAZolam (XANAX) 0.5 MG tablet  Obesity (BMI 30-39.9) rec healthy diet and exercise     of note when ready [facility] CTS surgery referral call to schedule   Provider: Dr. Olivia Mackie [name]-Internal Medicine

## 2020-03-23 ENCOUNTER — Telehealth: Payer: Self-pay | Admitting: Internal Medicine

## 2020-03-23 NOTE — Telephone Encounter (Signed)
Mailed script for TDAP to address on file.

## 2020-05-08 ENCOUNTER — Ambulatory Visit: Payer: BC Managed Care – PPO | Admitting: Internal Medicine

## 2020-05-08 ENCOUNTER — Encounter: Payer: Self-pay | Admitting: Internal Medicine

## 2020-05-08 ENCOUNTER — Other Ambulatory Visit: Payer: Self-pay

## 2020-05-08 VITALS — BP 124/82 | HR 104 | Temp 98.2°F | Ht 66.0 in | Wt 239.0 lb

## 2020-05-08 DIAGNOSIS — M79622 Pain in left upper arm: Secondary | ICD-10-CM | POA: Diagnosis not present

## 2020-05-08 DIAGNOSIS — N6002 Solitary cyst of left breast: Secondary | ICD-10-CM

## 2020-05-08 DIAGNOSIS — I10 Essential (primary) hypertension: Secondary | ICD-10-CM | POA: Diagnosis not present

## 2020-05-08 NOTE — Patient Instructions (Signed)
  Left breast cyst   Epidermal Cyst  An epidermal cyst is a small, painless lump under your skin. The cyst contains a grayish-white, bad-smelling substance (keratin). Do not try to pop or open an epidermal cyst yourself. What are the causes?  A blocked hair follicle.  A hair that curls and re-enters the skin instead of growing straight out of the skin.  A blocked pore.  Irritated skin.  An injury to the skin.  Certain conditions that are passed along from parent to child (inherited).  Human papillomavirus (HPV).  Long-term sun damage to the skin. What increases the risk?  Having acne.  Being overweight.  Being 34-69 years old. What are the signs or symptoms? These cysts are usually harmless, but they can get infected. Symptoms of infection may include:  Redness.  Inflammation.  Tenderness.  Warmth.  Fever.  A grayish-white, bad-smelling substance drains from the cyst.  Pus drains from the cyst. How is this treated? In many cases, epidermal cysts go away on their own without treatment. If a cyst becomes infected, treatment may include:  Opening and draining the cyst, done by a doctor. After draining, you may need minor surgery to remove the rest of the cyst.  Antibiotic medicine.  Shots of medicines (steroids) that help to reduce inflammation.  Surgery to remove the cyst. Surgery may be done if the cyst: ? Becomes large. ? Bothers you. ? Has a chance of turning into cancer.  Do not try to open a cyst yourself. Follow these instructions at home:  Take over-the-counter and prescription medicines only as told by your doctor.  If you were prescribed an antibiotic medicine, take it it as told by your doctor. Do not stop using the antibiotic even if you start to feel better.  Keep the area around your cyst clean and dry.  Wear loose, dry clothing.  Avoid touching your cyst.  Check your cyst every day for signs of infection. Check for: ? Redness,  swelling, or pain. ? Fluid or blood. ? Warmth. ? Pus or a bad smell.  Keep all follow-up visits as told by your doctor. This is important. How is this prevented?  Wear clean, dry, clothing.  Avoid wearing tight clothing.  Keep your skin clean and dry. Take showers or baths every day. Contact a doctor if:  Your cyst has symptoms of infection.  Your condition does not improve or gets worse.  You have a cyst that looks different from other cysts you have had.  You have a fever. Get help right away if:  Redness spreads from the cyst into the area close by. Summary  An epidermal cyst is a sac made of skin tissue.  If a cyst becomes infected, treatment may include surgery to open and drain the cyst, or to remove it.  Take over-the-counter and prescription medicines only as told by your doctor.  Contact a doctor if your condition is not improving or is getting worse.  Keep all follow-up visits as told by your doctor. This is important. This information is not intended to replace advice given to you by your health care provider. Make sure you discuss any questions you have with your health care provider. Document Revised: 02/03/2019 Document Reviewed: 07/22/2018 Elsevier Patient Education  2020 [employer].

## 2020-05-08 NOTE — Progress Notes (Signed)
Chief Complaint  Patient presents with  . Breast Problem   F/u  1. Left breast under breast about 6 oclock lesion cyst <1 cm which drains contents at times tender agreeable to get out with dermatology and left axilla pain at times will refer [place] left axilla  2. HTN BP on hctz 25 mg qd and benicar 20 mg qd overall improved   Review of Systems  Constitutional: Negative for weight loss.  HENT: Negative for hearing loss.   Eyes: Negative for blurred vision.  Respiratory: Negative for shortness of breath.   Cardiovascular: Negative for chest pain.  Gastrointestinal: Negative for abdominal pain.  Musculoskeletal: Negative for falls.  Skin: Negative for rash.  Neurological: Negative for headaches.  Psychiatric/Behavioral: Negative for depression.   Past Medical History:  Diagnosis Date  . Anemia   . Anxiety 2014  . Arthritis    right arm, right knee  . Chicken pox   . Depression   . Frequent headaches   . GERD (gastroesophageal reflux disease)   . Hyperlipemia   . Hypertension   . Obesity (BMI 30-39.9)   . Pre-diabetes   . Sleep apnea    CPAP  . Trigger finger    MIDDLE FINGER RIGHT HAND SURGERY ON JULY 31ST 2019   Past Surgical History:  Procedure Laterality Date  . COLONOSCOPY WITH PROPOFOL N/A 07/18/2019   Procedure: COLONOSCOPY WITH PROPOFOL;  Surgeon: [name], MD;  Location: [place];  Service: Endoscopy;  Laterality: N/A;  Latex Sleep apnea  . HYSTEROSCOPY WITH D & C N/A 05/20/2018   Procedure: DILATATION AND CURETTAGE /HYSTEROSCOPY;  Surgeon: [name], MD;  Location: [facility];  Service: Gynecology;  Laterality: N/A;  . INTRAUTERINE DEVICE (IUD) INSERTION N/A 05/20/2018   Procedure: INTRAUTERINE DEVICE (IUD) INSERTION;  Surgeon: [name], MD;  Location: [facility];  Service: Gynecology;  Laterality: N/A;  . WISDOM TOOTH EXTRACTION     Family History  Problem Relation Age of Onset  . Arthritis Mother   . Hyperlipidemia Mother   .  Hypertension Mother   . Diabetes Mellitus I Father        DM I  . Breast cancer Maternal Grandmother 62  . Arthritis Paternal Grandmother   . Diabetes Mellitus II Paternal Grandmother   . Arthritis Paternal Grandfather   . Hyperlipidemia Paternal Grandfather   . Stroke Paternal Grandfather   . Hypertension Paternal Grandfather   . Diabetes Mellitus II Sister   . Diabetes Mellitus II Sister   . Hyperlipidemia Sister   . Hypertension Sister    Social History   Socioeconomic History  . Marital status: Married    Spouse name: Not on file  . Number of children: 2  . Years of education: Not on file  . Highest education level: Not on file  Occupational History  . Occupation: Therapist, sports  Tobacco Use  . Smoking status: Never Smoker  . Smokeless tobacco: Never Used  Vaping Use  . Vaping Use: Never used  Substance and Sexual Activity  . Alcohol use: Yes    Alcohol/week: 2.0 standard drinks    Types: 2 Glasses of wine per week    Comment: OCC-WEEKENDS  . Drug use: No  . Sexual activity: Yes    Partners: Male    Birth control/protection: Surgical    Comment: vasectomy  Other Topics Concern  . Not on file  Social History Narrative   Married    2 kids    Works [employer]  Social Determinants of Health   Financial Resource Strain:   . Difficulty of Paying Living Expenses:   Food Insecurity:   . Worried About Charity fundraiser in the Last Year:   . Arboriculturist in the Last Year:   Transportation Needs:   . Film/video editor (Medical):   Marland Kitchen Lack of Transportation (Non-Medical):   Physical Activity:   . Days of Exercise per Week:   . Minutes of Exercise per Session:   Stress:   . Feeling of Stress :   Social Connections:   . Frequency of Communication with Friends and Family:   . Frequency of Social Gatherings with Friends and Family:   . Attends Religious Services:   . Active Member of Clubs or Organizations:   . Attends Archivist Meetings:   Marland Kitchen  Marital Status:   Intimate Partner Violence:   . Fear of Current or Ex-Partner:   . Emotionally Abused:   Marland Kitchen Physically Abused:   . Sexually Abused:    Current Meds  Medication Sig  . ACCU-CHEK GUIDE test strip Qd E 11.9 accucheck test strips  . blood glucose meter kit and supplies Dispense based on patient and insurance preference. Use up to four times daily as directed. (FOR ICD-10 E10.9, E11.9).  Marland Kitchen Cyanocobalamin (B-12) 1000 MCG TABS daily.   . diclofenac sodium (VOLTAREN) 1 % GEL Apply 2 g topically 4 (four) times daily.  Marland Kitchen docusate sodium (COLACE) 100 MG capsule Take 100 mg by mouth daily.   Marland Kitchen ELDERBERRY PO Take by mouth 2 (two) times daily. Gummies   . ferrous sulfate 325 (65 FE) MG tablet Take 325 mg by mouth daily with breakfast.   . hydrochlorothiazide (HYDRODIURIL) 25 MG tablet 1 tab daily by mouth  . Lancets (ACCU-CHEK SOFT TOUCH) lancets Qd Use as instructed E11.9  . metoprolol succinate (TOPROL-XL) 25 MG 24 hr tablet Take 1 tablet (25 mg total) by mouth daily.  Marland Kitchen olmesartan (BENICAR) 20 MG tablet Take 1 tablet (20 mg total) by mouth daily.  . SUPER B COMPLEX/C PO   . TURMERIC PO Take 500 mg by mouth daily. With ginger   . VITAMIN D, ERGOCALCIFEROL, PO Take 5,000 Units by mouth daily.    Allergies  Allergen Reactions  . Latex Itching    GLOVES (extended exposure)   Recent Results (from the past 2160 hour(s))  CBC with Differential/Platelet     Status: Abnormal   Collection Time: 03/19/20  8:30 AM  Result Value Ref Range   WBC 6.4 4.0 - 10.5 K/uL   RBC 3.73 (L) 3.87 - 5.11 Mil/uL   Hemoglobin 11.3 (L) 12.0 - 15.0 g/dL   HCT 32.9 (L) 36 - 46 %   MCV 88.2 78.0 - 100.0 fl   MCHC 34.3 30.0 - 36.0 g/dL   RDW 14.3 11.5 - 15.5 %   Platelets 320.0 150 - 400 K/uL   Neutrophils Relative % 65.1 43 - 77 %   Lymphocytes Relative 25.6 12 - 46 %   Monocytes Relative 7.4 3 - 12 %   Eosinophils Relative 1.6 0 - 5 %   Basophils Relative 0.3 0 - 3 %   Neutro Abs 4.1 1.4 - 7.7  K/uL   Lymphs Abs 1.6 0.7 - 4.0 K/uL   Monocytes Absolute 0.5 0 - 1 K/uL   Eosinophils Absolute 0.1 0 - 0 K/uL   Basophils Absolute 0.0 0 - 0 K/uL  HgB A1c     Status:  None   Collection Time: 03/19/20  8:30 AM  Result Value Ref Range   Hgb A1c MFr Bld 6.3 4.6 - 6.5 %    Comment: Glycemic Control Guidelines for People with Diabetes:Non Diabetic:  <6%Goal of Therapy: <7%Additional Action Suggested:  >8%   Lipid panel     Status: Abnormal   Collection Time: 03/19/20  8:30 AM  Result Value Ref Range   Cholesterol 177 0 - 200 mg/dL    Comment: ATP III Classification       Desirable:  < 200 mg/dL               Borderline High:  200 - 239 mg/dL          High:  > = 240 mg/dL   Triglycerides 53.0 0 - 149 mg/dL    Comment: Normal:  <150 mg/dLBorderline High:  150 - 199 mg/dL   HDL 54.00 >39.00 mg/dL   VLDL 10.6 0.0 - 40.0 mg/dL   LDL Cholesterol 113 (H) 0 - 99 mg/dL   Total CHOL/HDL Ratio 3     Comment:                Men          Women1/2 Average Risk     3.4          3.3Average Risk          5.0          4.42X Average Risk          9.6          7.13X Average Risk          15.0          11.0                       NonHDL 123.23     Comment: NOTE:  Non-HDL goal should be 30 mg/dL higher than patient's LDL goal (i.e. LDL goal of < 70 mg/dL, would have non-HDL goal of < 100 mg/dL)  Comprehensive metabolic panel     Status: Abnormal   Collection Time: 03/19/20  8:30 AM  Result Value Ref Range   Sodium 137 135 - 145 mEq/L   Potassium 3.7 3.5 - 5.1 mEq/L   Chloride 99 96 - 112 mEq/L   CO2 29 19 - 32 mEq/L   Glucose, Bld 107 (H) 70 - 99 mg/dL   BUN 14 6 - 23 mg/dL   Creatinine, Ser 0.72 0.40 - 1.20 mg/dL   Total Bilirubin 0.3 0.2 - 1.2 mg/dL   Alkaline Phosphatase 72 39 - 117 U/L   AST 16 0 - 37 U/L   ALT 14 0 - 35 U/L   Total Protein 6.8 6.0 - 8.3 g/dL   Albumin 4.3 3.5 - 5.2 g/dL   GFR 105.53 >60.00 mL/min   Calcium 9.1 8.4 - 10.5 mg/dL   Objective  Body mass index is 38.58 kg/m. Wt  Readings from Last 3 Encounters:  05/08/20 239 lb (108.4 kg)  03/22/20 241 lb (109.3 kg)  02/06/20 242 lb (109.8 kg)   Temp Readings from Last 3 Encounters:  05/08/20 98.2 F (36.8 C) (Oral)  03/22/20 97.6 F (36.4 C) (Temporal)  12/28/19 (!) 97.3 F (36.3 C) (Temporal)   BP Readings from Last 3 Encounters:  05/08/20 124/82  03/22/20 124/82  02/06/20 136/88   Pulse Readings from Last 3 Encounters:  05/08/20 (!) 104  03/22/20 89  02/06/20 97  Physical Exam Vitals and nursing note reviewed.  Constitutional:      Appearance: Normal appearance. She is well-developed and well-groomed. She is obese.  HENT:     Head: Normocephalic and atraumatic.  Eyes:     Conjunctiva/sclera: Conjunctivae normal.     Pupils: Pupils are equal, round, and reactive to light.  Cardiovascular:     Rate and Rhythm: Normal rate and regular rhythm.     Heart sounds: Normal heart sounds. No murmur heard.   Pulmonary:     Effort: Pulmonary effort is normal.     Breath sounds: Normal breath sounds.  Chest:    Skin:    General: Skin is warm and dry.  Neurological:     General: No focal deficit present.     Mental Status: She is alert and oriented to person, place, and time. Mental status is at baseline.     Gait: Gait normal.  Psychiatric:        Attention and Perception: Attention and perception normal.        Mood and Affect: Mood and affect normal.        Speech: Speech normal.        Behavior: Behavior normal. Behavior is cooperative.        Thought Content: Thought content normal.        Cognition and Memory: Cognition and memory normal.        Judgment: Judgment normal.     Assessment  Plan  Breast cyst, left 6 oclock left breast - Plan: Ambulatory referral to Dermatology [address] for removal, MM DIAG BREAST TOMO BILATERAL, US BREAST LTD UNI LEFT INC AXILLA  Left axillary pain - Plan: MM DIAG BREAST TOMO BILATERAL, US BREAST LTD UNI LEFT INC AXILLA  Essential hypertension    Cont meds  HM  PrFlu shot utd  utd tdapdue 11/26/2019 Immune hep B and MMR covid 2/2 had  Pap [facility] 03/23/18 neg no HPV testing done Endometrial Bx 05/20/18 negative with IUD now Has IUD   mammo8/21/20negativeordered   Colonoscopy 07/18/2019 normal Wohl repeat in 10 years   Declines HIV testing rec healthy diet and exercise  rec take D3 2000 IUD dailyovider: Dr. Olivia Mackie [name]-Internal Medicine

## 2020-05-08 NOTE — Progress Notes (Signed)
Patient presenting with lump/bump underneath the left breast. States the area is sore but not painful. States this has been there for sometime now. Patient states she is due for a mammogram. They are needing to know if this should be screening or diagnostic.

## 2020-06-04 ENCOUNTER — Telehealth: Payer: Self-pay | Admitting: Internal Medicine

## 2020-06-04 NOTE — Telephone Encounter (Signed)
Rejection Reason - Patient Declined - Patient cancelled appt" [facility] PA said 4 days ago

## 2020-06-18 ENCOUNTER — Other Ambulatory Visit: Payer: Self-pay

## 2020-06-18 ENCOUNTER — Ambulatory Visit
Admission: RE | Admit: 2020-06-18 | Discharge: 2020-06-18 | Disposition: A | Discharge status: Home / Self Care | Payer: BC Managed Care – PPO | Source: Ambulatory Visit | Attending: Internal Medicine | Admitting: Internal Medicine

## 2020-06-18 DIAGNOSIS — M79622 Pain in left upper arm: Secondary | ICD-10-CM | POA: Insufficient documentation

## 2020-06-18 DIAGNOSIS — N6002 Solitary cyst of left breast: Secondary | ICD-10-CM | POA: Diagnosis not present

## 2020-07-18 ENCOUNTER — Ambulatory Visit: Payer: BC Managed Care – PPO | Admitting: Obstetrics and Gynecology

## 2020-08-01 ENCOUNTER — Other Ambulatory Visit: Payer: Self-pay

## 2020-08-02 ENCOUNTER — Ambulatory Visit (INDEPENDENT_AMBULATORY_CARE_PROVIDER_SITE_OTHER): Payer: BC Managed Care – PPO | Admitting: Obstetrics and Gynecology

## 2020-08-02 ENCOUNTER — Other Ambulatory Visit: Payer: Self-pay

## 2020-08-02 ENCOUNTER — Encounter: Payer: Self-pay | Admitting: Obstetrics and Gynecology

## 2020-08-02 VITALS — BP 136/85 | HR 72 | Ht 66.0 in | Wt 234.0 lb

## 2020-08-02 DIAGNOSIS — Z1239 Encounter for other screening for malignant neoplasm of breast: Secondary | ICD-10-CM

## 2020-08-02 DIAGNOSIS — Z01419 Encounter for gynecological examination (general) (routine) without abnormal findings: Secondary | ICD-10-CM

## 2020-08-02 DIAGNOSIS — R102 Pelvic and perineal pain: Secondary | ICD-10-CM

## 2020-08-02 NOTE — Progress Notes (Signed)
Gynecology Annual Exam  PCP: [name], [name], MD  Chief Complaint:  Chief Complaint  Patient presents with  . Gynecologic Exam    sporadic vaginal pressure    History of Present Illness: Patient is a 44 y.o. [id number] presents for annual exam. The patient has no complaints today.   LMP: No LMP recorded. (Menstrual status: IUD). Amenorhea on IUD  The patient is sexually active. She currently uses IUD for contraception. She denies dyspareunia.  Does report increased pelvic/vaginal pressure that recently started.  No associated with bowl movements/constipation.  The patient does perform self breast exams.  There is no notable family history of breast or ovarian cancer in her family.  The patient wears seatbelts: yes.   The patient has regular exercise: not asked.    The patient denies current symptoms of depression.    Review of Systems: Review of Systems  Constitutional: Negative for chills and fever.  HENT: Negative for congestion.   Respiratory: Negative for cough and shortness of breath.   Cardiovascular: Negative for chest pain and palpitations.  Gastrointestinal: Negative for abdominal pain, constipation, diarrhea, heartburn, nausea and vomiting.  Genitourinary: Negative for dysuria, frequency and urgency.  Skin: Negative for itching and rash.  Neurological: Negative for dizziness and headaches.  Endo/Heme/Allergies: Negative for polydipsia.  Psychiatric/Behavioral: Negative for depression.    Past Medical History:  Patient Active Problem List   Diagnosis Date Noted  . 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 Prediabetes 03/22/2020  . Vitamin D deficiency 03/22/2020  . CTS (carpal tunnel syndrome) 12/30/2019    Moderate to severe right mild on left per EMG/NCS     . DM2 (diabetes mellitus, type 2) ([place]) 09/28/2019  . Low back pain 09/28/2019  . Special screening for malignant neoplasms, colon   . Dizziness 06/16/2019  . Numbness and tingling in both hands  06/16/2019  . OSA (obstructive sleep apnea) 06/16/2019    Moderate osa on cpap     . Nonintractable headache 06/16/2019  . Type 2 diabetes mellitus without complication, without long-term current use of insulin ([place]) 06/16/2019  . Edema 06/16/2019  . Eczema 12/01/2018  . Acute pain of right shoulder 12/01/2018  . Epidermal inclusion cyst 12/01/2018  . Right arm pain 12/01/2018  . Sinus tachycardia 12/01/2018  . HTN (hypertension) 06/22/2018  . Trigger finger, right 06/22/2018  . No blood products 05/11/2018    J[employer]ov[employer] no blood products   . Low vitamin D level 03/23/2018    Was 25 on 03/04/2018-taking vitamin D3 5000 IU daily   . Microcytic hypochromic anemia 03/23/2018  . Anxiety and depression 01/14/2016  . Obesity (BMI 30-39.9) 01/14/2016  . Annual physical exam 01/14/2016    Past Surgical History:  Past Surgical History:  Procedure Laterality Date  . COLONOSCOPY WITH PROPOFOL N/A 07/18/2019   Procedure: COLONOSCOPY WITH PROPOFOL;  Surgeon: [name], MD;  Location: [place];  Service: Endoscopy;  Laterality: N/A;  Latex Sleep apnea  . HYSTEROSCOPY WITH D & C N/A 05/20/2018   Procedure: DILATATION AND CURETTAGE /HYSTEROSCOPY;  Surgeon: [name], MD;  Location: [facility];  Service: Gynecology;  Laterality: N/A;  . INTRAUTERINE DEVICE (IUD) INSERTION N/A 05/20/2018   Procedure: INTRAUTERINE DEVICE (IUD) INSERTION;  Surgeon: [name], MD;  Location: [facility];  Service: Gynecology;  Laterality: N/A;  . WISDOM TOOTH EXTRACTION      Gynecologic History:  No LMP recorded. (Menstrual status: IUD). Contraception: 05/20/2018 Mirena  IUD Last Pap: Results were: 03/23/2018 NIL and  HR HPV negative  Last mammogram: 06/18/2020 Results were: BI-RAD II  Obstetric History: [id number]  Family History:  Family History  Problem Relation Age of Onset  . Arthritis Mother   . Hyperlipidemia Mother   . Hypertension Mother   . Diabetes Mellitus I Father         DM I  . Breast cancer Maternal Grandmother 53  . Arthritis Paternal Grandmother   . Diabetes Mellitus II Paternal Grandmother   . Arthritis Paternal Grandfather   . Hyperlipidemia Paternal Grandfather   . Stroke Paternal Grandfather   . Hypertension Paternal Grandfather   . Diabetes Mellitus II Sister   . Diabetes Mellitus II Sister   . Hyperlipidemia Sister   . Hypertension Sister     Social History:  Social History   Socioeconomic History  . Marital status: Married    Spouse name: Not on file  . Number of children: 2  . Years of education: Not on file  . Highest education level: Not on file  Occupational History  . Occupation: Therapist, sports  Tobacco Use  . Smoking status: Never Smoker  . Smokeless tobacco: Never Used  Vaping Use  . Vaping Use: Never used  Substance and Sexual Activity  . Alcohol use: Yes    Alcohol/week: 2.0 standard drinks    Types: 2 Glasses of wine per week    Comment: OCC-WEEKENDS  . Drug use: No  . Sexual activity: Yes    Partners: Male    Birth control/protection: Surgical    Comment: vasectomy  Other Topics Concern  . Not on file  Social History Narrative   Married    2 kids    Works occup[employer]    Social Determinants of Health   Financial Resource Strain:   . Difficulty of Paying Living Expenses: Not on file  Food Insecurity:   . Worried About Charity fundraiser in the Last Year: Not on file  . Ran Out of Food in the Last Year: Not on file  Transportation Needs:   . Lack of Transportation (Medical): Not on file  . Lack of Transportation (Non-Medical): Not on file  Physical Activity:   . Days of Exercise per Week: Not on file  . Minutes of Exercise per Session: Not on file  Stress:   . Feeling of Stress : Not on file  Social Connections:   . Frequency of Communication with Friends and Family: Not on file  . Frequency of Social Gatherings with Friends and Family: Not on file  . Attends Religious Services: Not on file  .  Active Member of Clubs or Organizations: Not on file  . Attends Archivist Meetings: Not on file  . Marital Status: Not on file  Intimate Partner Violence:   . Fear of Current or Ex-Partner: Not on file  . Emotionally Abused: Not on file  . Physically Abused: Not on file  . Sexually Abused: Not on file    Allergies:  Allergies  Allergen Reactions  . Latex Itching    GLOVES (extended exposure)    Medications: Prior to Admission medications   Medication Sig Start Date End Date Taking? Authorizing Provider  ACCU-CHEK GUIDE test strip Qd E 11.9 accucheck test strips 03/22/20   [name], [name], MD  ALPRAZolam Duanne Moron) 0.5 MG tablet Take 1 tablet (0.5 mg total) by mouth daily as needed for anxiety. Patient not taking: Reported on 05/08/2020 03/22/20   [name], [name], MD  blood glucose meter kit and  supplies Dispense based on patient and insurance preference. Use up to four times daily as directed. (FOR ICD-10 E10.9, E11.9). 05/03/19   [name], [name], MD  CRANBERRY PO Take by mouth daily. Patient not taking: Reported on 05/08/2020    [provider]  Cyanocobalamin (B-12) 1000 MCG TABS daily.  08/16/19   [provider]  diclofenac sodium (VOLTAREN) 1 % GEL Apply 2 g topically 4 (four) times daily. 12/02/18   [name], [name], MD  docusate sodium (COLACE) 100 MG capsule Take 100 mg by mouth daily.     [provider]  ELDERBERRY PO Take by mouth 2 (two) times daily. Gummies     [provider]  ferrous sulfate 325 (65 FE) MG tablet Take 325 mg by mouth daily with breakfast.     [provider]  hydrochlorothiazide (HYDRODIURIL) 25 MG tablet 1 tab daily by mouth 03/22/20   [name], [name], MD  hydrocortisone 2.5 % cream Apply topically 2 (two) times daily. As needed neck, face Patient not taking: Reported on 03/22/2020 12/01/18   [name], [name], MD  ketoconazole (NIZORAL) 2 % cream daily as  needed.  Patient not taking: Reported on 05/08/2020 04/20/19   [provider]  Lancets (ACCU-CHEK SOFT TOUCH) lancets Qd Use as instructed E11.9 03/22/20   [name], [name], MD  metoprolol succinate (TOPROL-XL) 25 MG 24 hr tablet Take 1 tablet (25 mg total) by mouth daily. 11/29/19   [name], [name], MD  naproxen sodium (ALEVE) 220 MG tablet Take 220 mg by mouth daily as needed. Patient not taking: Reported on 05/08/2020    [provider]  olmesartan (BENICAR) 20 MG tablet Take 1 tablet (20 mg total) by mouth daily. 03/22/20   [name], [name], MD  SUPER B COMPLEX/C PO  08/16/19   [provider]  triamcinolone cream (KENALOG) 0.1 % Apply 1 application topically 2 (two) times daily. Patient not taking: Reported on 03/22/2020 12/01/18   [name], [name], MD  TURMERIC PO Take 500 mg by mouth daily. With ginger     [provider]  VITAMIN D, ERGOCALCIFEROL, PO Take 5,000 Units by mouth daily.     [provider]    Physical Exam Vitals: Blood pressure 136/85, pulse 72, height '5\' 6"'  (1.676 m), weight 234 lb (106.1 kg).   General: NAD HEENT: normocephalic, anicteric Thyroid: no enlargement, no palpable nodules Pulmonary: No increased work of breathing, CTAB Cardiovascular: RRR, distal pulses 2+ Breast: Breast symmetrical, no tenderness, no palpable nodules or masses, no skin or nipple retraction present, no nipple discharge.  No axillary or supraclavicular lymphadenopathy. Abdomen: NABS, soft, non-tender, non-distended.  Umbilicus without lesions.  No hepatomegaly, splenomegaly or masses palpable. No evidence of hernia  Genitourinary:  External: Normal external female genitalia.  Normal urethral meatus, normal Bartholin's and Skene's glands.    Vagina: Normal vaginal mucosa, no evidence of prolapse.  Very narrow pubic arch  Cervix: Grossly normal in appearance, no bleeding, IUD string visualized  Uterus: Non-enlarged,  mobile, normal contour.  No CMT  Adnexa: ovaries non-enlarged, no adnexal masses  Rectal: deferred  Lymphatic: no evidence of inguinal lymphadenopathy Extremities: no edema, erythema, or tenderness Neurologic: Grossly intact Psychiatric: mood appropriate, affect full  Female chaperone present for pelvic and breast  portions of the physical exam  Immunization History  Administered Date(s) Administered  . Influenza,inj,Quad PF,6+ Mos 07/18/2020  . Influenza-Unspecified 06/26/2015, 06/16/2018, 08/18/2019  . Moderna SARS-COVID-2 Vaccination 02/02/2020, 03/01/2020  . Tdap 11/25/2009  Assessment: 44 y.o. G2P2002 routine annual exam  Plan: Problem List Items Addressed This Visit    None    Visit Diagnoses    Encounter for gynecological examination without abnormal finding    -  Primary   Breast screening       Pelvic pressure in female       Relevant Orders   US Transvaginal Non-OB      1) Mammogram - recommend yearly screening mammogram.  Mammogram Is up to date   2) STI screening  was notoffered and therefore not obtained  3) ASCCP guidelines and rational discussed.  Patient opts for every 3 years screening interval  4) Contraception - the patient is currently using  IUD.  She is happy with her current form of contraception and plans to continue   5) Routine healthcare maintenance including cholesterol, diabetes screening discussed managed by PCP  6) Pelvic pressure - new onset, normal exam.  Will check TVUS for development of fibroids or adnexal process  7) Return in about 1 week (around 08/09/2020) for TVUS 1-2 weeks.   [name], MD, [facility], [facility] 08/02/2020, 8:14 AM

## 2020-08-03 ENCOUNTER — Other Ambulatory Visit: Payer: Self-pay

## 2020-08-03 DIAGNOSIS — L309 Dermatitis, unspecified: Secondary | ICD-10-CM

## 2020-08-03 MED ORDER — HYDROCORTISONE 2.5 % EX CREA: TOPICAL_CREAM | Freq: Two times a day (BID) | CUTANEOUS | 0 refills | Status: DC

## 2020-08-09 ENCOUNTER — Ambulatory Visit (INDEPENDENT_AMBULATORY_CARE_PROVIDER_SITE_OTHER): Payer: BC Managed Care – PPO

## 2020-08-09 ENCOUNTER — Other Ambulatory Visit: Payer: Self-pay

## 2020-08-09 ENCOUNTER — Ambulatory Visit: Payer: BC Managed Care – PPO | Admitting: Obstetrics and Gynecology

## 2020-08-09 ENCOUNTER — Encounter: Payer: Self-pay | Admitting: Obstetrics and Gynecology

## 2020-08-09 VITALS — BP 130/72 | Ht 66.0 in | Wt 236.4 lb

## 2020-08-09 DIAGNOSIS — R102 Pelvic and perineal pain: Secondary | ICD-10-CM

## 2020-08-09 DIAGNOSIS — N83202 Unspecified ovarian cyst, left side: Secondary | ICD-10-CM | POA: Diagnosis not present

## 2020-08-09 NOTE — Progress Notes (Signed)
Gynecology Ultrasound Follow Up  Chief Complaint:  Chief Complaint  Patient presents with  . Gynecologic Exam     History of Present Illness: Patient is a 44 y.o. female who presents today for ultrasound evaluation of pelvic pain.  Ultrasound demonstrates the following findgins Adnexa: 4cm simple left ovarian cyst Uterus: non-enlarged with endometrial stripe imaging normally Additional: minimal free fluid  Review of Systems: Review of Systems  Constitutional: Negative.   Gastrointestinal: Positive for abdominal pain. Negative for blood in stool, constipation, diarrhea, heartburn, melena, nausea and vomiting.  Genitourinary: Negative.     Past Medical History:  Past Medical History:  Diagnosis Date  . Anemia   . Anxiety 2014  . Arthritis    right arm, right knee  . Chicken pox   . Depression   . Frequent headaches   . GERD (gastroesophageal reflux disease)   . Hyperlipemia   . Hypertension   . Obesity (BMI 30-39.9)   . Pre-diabetes   . Sleep apnea    CPAP  . Trigger finger    MIDDLE FINGER RIGHT HAND SURGERY ON JULY 31ST 2019    Past Surgical History:  Past Surgical History:  Procedure Laterality Date  . COLONOSCOPY WITH PROPOFOL N/A 07/18/2019   Procedure: COLONOSCOPY WITH PROPOFOL;  Surgeon: [name], MD;  Location: [place];  Service: Endoscopy;  Laterality: N/A;  Latex Sleep apnea  . HYSTEROSCOPY WITH D & C N/A 05/20/2018   Procedure: DILATATION AND CURETTAGE /HYSTEROSCOPY;  Surgeon: [name], MD;  Location: [facility];  Service: Gynecology;  Laterality: N/A;  . INTRAUTERINE DEVICE (IUD) INSERTION N/A 05/20/2018   Procedure: INTRAUTERINE DEVICE (IUD) INSERTION;  Surgeon: [name], MD;  Location: [facility];  Service: Gynecology;  Laterality: N/A;  . WISDOM TOOTH EXTRACTION      Gynecologic History:  No LMP recorded. (Menstrual status: IUD). Contraception: IUD Last Pap: 03/23/2018 Results were: .no abnormalities  Family  History:  Family History  Problem Relation Age of Onset  . Arthritis Mother   . Hyperlipidemia Mother   . Hypertension Mother   . Diabetes Mellitus I Father        DM I  . Breast cancer Maternal Grandmother 37  . Arthritis Paternal Grandmother   . Diabetes Mellitus II Paternal Grandmother   . Arthritis Paternal Grandfather   . Hyperlipidemia Paternal Grandfather   . Stroke Paternal Grandfather   . Hypertension Paternal Grandfather   . Diabetes Mellitus II Sister   . Diabetes Mellitus II Sister   . Hyperlipidemia Sister   . Hypertension Sister     Social History:  Social History   Socioeconomic History  . Marital status: Married    Spouse name: Not on file  . Number of children: 2  . Years of education: Not on file  . Highest education level: Not on file  Occupational History  . Occupation: Therapist, sports  Tobacco Use  . Smoking status: Never Smoker  . Smokeless tobacco: Never Used  Vaping Use  . Vaping Use: Never used  Substance and Sexual Activity  . Alcohol use: Yes    Alcohol/week: 2.0 standard drinks    Types: 2 Glasses of wine per week    Comment: OCC-WEEKENDS  . Drug use: No  . Sexual activity: Yes    Partners: Male    Birth control/protection: Surgical    Comment: vasectomy  Other Topics Concern  . Not on file  Social History Narrative   Married    2 kids  Works [employer]    Social Determinants of Health   Financial Resource Strain:   . Difficulty of Paying Living Expenses: Not on file  Food Insecurity:   . Worried About Charity fundraiser in the Last Year: Not on file  . Ran Out of Food in the Last Year: Not on file  Transportation Needs:   . Lack of Transportation (Medical): Not on file  . Lack of Transportation (Non-Medical): Not on file  Physical Activity:   . Days of Exercise per Week: Not on file  . Minutes of Exercise per Session: Not on file  Stress:   . Feeling of Stress : Not on file  Social Connections:   . Frequency of  Communication with Friends and Family: Not on file  . Frequency of Social Gatherings with Friends and Family: Not on file  . Attends Religious Services: Not on file  . Active Member of Clubs or Organizations: Not on file  . Attends Archivist Meetings: Not on file  . Marital Status: Not on file  Intimate Partner Violence:   . Fear of Current or Ex-Partner: Not on file  . Emotionally Abused: Not on file  . Physically Abused: Not on file  . Sexually Abused: Not on file    Allergies:  Allergies  Allergen Reactions  . Latex Itching    GLOVES (extended exposure)    Medications: Prior to Admission medications   Medication Sig Start Date End Date Taking? Authorizing Provider  ACCU-CHEK GUIDE test strip Qd E 11.9 accucheck test strips 03/22/20  Yes [name], [name], MD  blood glucose meter kit and supplies Dispense based on patient and insurance preference. Use up to four times daily as directed. (FOR ICD-10 E10.9, E11.9). 05/03/19  Yes [name], [name], MD  Cyanocobalamin (B-12) 1000 MCG TABS daily.  08/16/19  Yes [provider]  diclofenac sodium (VOLTAREN) 1 % GEL Apply 2 g topically 4 (four) times daily. 12/02/18  Yes [name], [name], MD  docusate sodium (COLACE) 100 MG capsule Take 100 mg by mouth daily.    Yes [provider]  ELDERBERRY PO Take by mouth 2 (two) times daily. Gummies    Yes [provider]  ferrous sulfate 325 (65 FE) MG tablet Take 325 mg by mouth daily with breakfast.    Yes [provider]  hydrochlorothiazide (HYDRODIURIL) 25 MG tablet 1 tab daily by mouth 03/22/20  Yes [name], [name], MD  hydrocortisone 2.5 % cream Apply topically 2 (two) times daily. As needed neck, face 08/03/20  Yes [name], [name], MD  ketoconazole (NIZORAL) 2 % cream daily as needed.  04/20/19  Yes [provider]  Lancets (ACCU-CHEK SOFT TOUCH) lancets Qd Use as instructed E11.9 03/22/20  Yes  [name], [name], MD  metoprolol succinate (TOPROL-XL) 25 MG 24 hr tablet Take 1 tablet (25 mg total) by mouth daily. 11/29/19  Yes [name], [name], MD  naproxen sodium (ALEVE) 220 MG tablet Take 220 mg by mouth daily as needed.    Yes [provider]  SUPER B COMPLEX/C PO  08/16/19  Yes [provider]  triamcinolone cream (KENALOG) 0.1 % Apply 1 application topically 2 (two) times daily. 12/01/18  Yes [name], [name], MD  TURMERIC PO Take 500 mg by mouth daily. With ginger    Yes [provider]  VITAMIN D, ERGOCALCIFEROL, PO Take 5,000 Units by mouth daily.    Yes [provider]    Physical  Exam Vitals: Blood pressure 130/72, height '5\' 6"'  (1.676 m), weight 236 lb 6.4 oz (107.2 kg).  General: NAD HEENT: normocephalic, anicteric Pulmonary: No increased work of breathing Extremities: no edema, erythema, or tenderness Neurologic: Grossly intact, normal gait Psychiatric: mood appropriate, affect full  US Transvaginal Non-OB  Result Date: 08/09/2020 Patient Name: Kathleen Burke DOB: September 06, 1976 MRN: [id number] ULTRASOUND REPORT Location: [facility] Date of Service: 08/09/2020 Indications:Pelvic Pain Findings: The uterus is retroverted and measures 8.3 x 6.3 x 6.4 cm. Echo texture is heterogenous with evidence of focal masses. There is some anechoic fluid seen in the uterine myometrium measuring 21.4 x 10.2 x 23.3 mm. Within the uterus are two fibroids measuring: Fibroid 1:18.5 x 20.7 x 18.1 mm intramural fundal left Fibroid 2:15.7 x 8.5 x 9.7 mm intramural left The Endometrium measures 2.4 mm. The IUD is correctly placed within the uterus. Right Ovary measures 2.6 x 2.3 x 1.5 cm. It is normal in appearance. Left Ovary measures 5.4 x 3.5 x 3.1 cm. There is a simple cyst in the left ovary measuring 46.3 x 32.0 x 34.9 mm. Survey of the adnexa demonstrates no adnexal masses. There is some free fluid in the posterior cul-de-sac measuring 42.5  x 17.5 x 12.9 mm Impression: 1. The IUD is correctly placed within the uterus. 2. There are two intramural fibroids seen. 3. There is some anechoic fluid within the uterine muscle. 4. Normal appearing right ovary. 5. There is a 4.6 cm simple cyst in the left ovary. 6. There is anechoic fluid adjacent to the left ovary. Recommendations: 1.Clinical correlation with the patient's History and Physical Exam. Gweneth Dimitri, RT Images reviewed.  IUD is in proper position with the uterine cavity.  There is a simple 4.6cm left ovarian cyst.  Follow up imaging in 6 week is recommended. Gordy Levan et al. Management of Asymptomatic Ovarian and Other Adnexal Cysts Imaged at [place]: Society of Radiologists in [place] Statement 2010. Radiology 256 (Sept 2010): [phone].). [name], MD, [facility], [facility] 08/09/2020, 10:29 AM     Assessment: 44 y.o. G2P2002 ultrasound for pelvic pain  Plan: Problem List Items Addressed This Visit    None    Visit Diagnoses    Left ovarian cyst    -  Primary   Relevant Orders   US Transvaginal Non-OB      1) Simple left ovarian cyst - Follow up imaging 6 week - cyst resolved and symptoms resolved no further interventions - cyst resolved symptoms persist diagnostic laparoscopy - cyst not resolved symptoms persist laparoscopic ovarian cystectomy  2) A total of 15 minutes were spent in face-to-face contact with the patient during this encounter with over half of that time devoted to counseling and coordination of care.  3) Return in about 6 weeks (around 09/20/2020) for TVUS and follow up.    [name], MD, [facility], [facility] 08/09/2020, 10:41 AM

## 2020-09-13 ENCOUNTER — Other Ambulatory Visit: Payer: Self-pay

## 2020-09-13 ENCOUNTER — Other Ambulatory Visit (INDEPENDENT_AMBULATORY_CARE_PROVIDER_SITE_OTHER): Payer: BC Managed Care – PPO

## 2020-09-13 DIAGNOSIS — E559 Vitamin D deficiency, unspecified: Secondary | ICD-10-CM

## 2020-09-13 DIAGNOSIS — I10 Essential (primary) hypertension: Secondary | ICD-10-CM | POA: Diagnosis not present

## 2020-09-13 DIAGNOSIS — E119 Type 2 diabetes mellitus without complications: Secondary | ICD-10-CM | POA: Diagnosis not present

## 2020-09-13 DIAGNOSIS — Z1389 Encounter for screening for other disorder: Secondary | ICD-10-CM

## 2020-09-13 LAB — CBC WITH DIFFERENTIAL/PLATELET
Basophils Absolute: 0 10*3/uL (ref 0.0–0.1)
Basophils Relative: 0.3 % (ref 0.0–3.0)
Eosinophils Absolute: 0.1 10*3/uL (ref 0.0–0.7)
Eosinophils Relative: 2 % (ref 0.0–5.0)
HCT: 34.7 % — ABNORMAL LOW (ref 36.0–46.0)
Hemoglobin: 11.8 g/dL — ABNORMAL LOW (ref 12.0–15.0)
Lymphocytes Relative: 28.2 % (ref 12.0–46.0)
Lymphs Abs: 1.9 10*3/uL (ref 0.7–4.0)
MCHC: 33.8 g/dL (ref 30.0–36.0)
MCV: 85.9 fl (ref 78.0–100.0)
Monocytes Absolute: 0.4 10*3/uL (ref 0.1–1.0)
Monocytes Relative: 6.3 % (ref 3.0–12.0)
Neutro Abs: 4.3 10*3/uL (ref 1.4–7.7)
Neutrophils Relative %: 63.2 % (ref 43.0–77.0)
Platelets: 366 10*3/uL (ref 150.0–400.0)
RBC: 4.04 Mil/uL (ref 3.87–5.11)
RDW: 14.3 % (ref 11.5–15.5)
WBC: 6.8 10*3/uL (ref 4.0–10.5)

## 2020-09-13 LAB — LIPID PANEL
Cholesterol: 170 mg/dL (ref 0–200)
HDL: 63.4 mg/dL (ref >39.00)
LDL Cholesterol: 97 mg/dL (ref 0–99)
NonHDL: 106.72
Total CHOL/HDL Ratio: 3
Triglycerides: 47 mg/dL (ref 0.0–149.0)
VLDL: 9.4 mg/dL (ref 0.0–40.0)

## 2020-09-13 LAB — COMPREHENSIVE METABOLIC PANEL
ALT: 22 U/L (ref 0–35)
AST: 22 U/L (ref 0–37)
Albumin: 4.4 g/dL (ref 3.5–5.2)
Alkaline Phosphatase: 71 U/L (ref 39–117)
BUN: 12 mg/dL (ref 6–23)
CO2: 30 mEq/L (ref 19–32)
Calcium: 9.3 mg/dL (ref 8.4–10.5)
Chloride: 98 mEq/L (ref 96–112)
Creatinine, Ser: 0.7 mg/dL (ref 0.40–1.20)
GFR: 103.69 mL/min (ref >60.00)
Glucose, Bld: 97 mg/dL (ref 70–99)
Potassium: 3.5 mEq/L (ref 3.5–5.1)
Sodium: 136 mEq/L (ref 135–145)
Total Bilirubin: 0.4 mg/dL (ref 0.2–1.2)
Total Protein: 7.6 g/dL (ref 6.0–8.3)

## 2020-09-13 LAB — HEMOGLOBIN A1C: Hgb A1c MFr Bld: 6.2 % (ref 4.6–6.5)

## 2020-09-13 LAB — VITAMIN D 25 HYDROXY (VIT D DEFICIENCY, FRACTURES): VITD: 46.43 ng/mL (ref 30.00–100.00)

## 2020-09-14 LAB — URINALYSIS, ROUTINE W REFLEX MICROSCOPIC
Bilirubin Urine: NEGATIVE
Glucose, UA: NEGATIVE
Hgb urine dipstick: NEGATIVE
Ketones, ur: NEGATIVE
Leukocytes,Ua: NEGATIVE
Nitrite: NEGATIVE
Protein, ur: NEGATIVE
Specific Gravity, Urine: 1.002 (ref 1.001–1.03)
pH: 6.5 (ref 5.0–8.0)

## 2020-09-17 ENCOUNTER — Ambulatory Visit (INDEPENDENT_AMBULATORY_CARE_PROVIDER_SITE_OTHER): Payer: BC Managed Care – PPO

## 2020-09-17 ENCOUNTER — Other Ambulatory Visit: Payer: Self-pay

## 2020-09-17 ENCOUNTER — Ambulatory Visit (INDEPENDENT_AMBULATORY_CARE_PROVIDER_SITE_OTHER): Payer: BC Managed Care – PPO | Admitting: Obstetrics and Gynecology

## 2020-09-17 ENCOUNTER — Encounter: Payer: Self-pay | Admitting: Obstetrics and Gynecology

## 2020-09-17 VITALS — BP 132/92 | Ht 66.0 in | Wt 238.0 lb

## 2020-09-17 DIAGNOSIS — N840 Polyp of corpus uteri: Secondary | ICD-10-CM | POA: Diagnosis not present

## 2020-09-17 DIAGNOSIS — N83202 Unspecified ovarian cyst, left side: Secondary | ICD-10-CM

## 2020-09-17 NOTE — Progress Notes (Signed)
Gynecology Ultrasound Follow Up  Chief Complaint:  Chief Complaint  Patient presents with  . Follow-up    TVUS, no concerns. RM 5     History of Present Illness: Patient is a 44 y.o. female who presents today for ultrasound evaluation of left ovarian cyst .  Ultrasound demonstrates the following findgins Adnexa: left ovarian cyst, interval decrease in size and given free fluid surrounding left ovary suspect ruptured Uterus: Non-enlarged with endometrial stripe which shows evidence of an endometrial polyp which was not noted on prior ultrasound.   Additional: IUD remains in appropriate location  Review of Systems: Review of Systems  Constitutional: Negative.   Gastrointestinal: Negative.   Genitourinary: Negative.     Past Medical History:  Past Medical History:  Diagnosis Date  . Anemia   . Anxiety 2014  . Arthritis    right arm, right knee  . Chicken pox   . Depression   . Frequent headaches   . GERD (gastroesophageal reflux disease)   . Hyperlipemia   . Hypertension   . Obesity (BMI 30-39.9)   . Pre-diabetes   . Sleep apnea    CPAP  . Trigger finger    MIDDLE FINGER RIGHT HAND SURGERY ON JULY 31ST 2019    Past Surgical History:  Past Surgical History:  Procedure Laterality Date  . COLONOSCOPY WITH PROPOFOL N/A 07/18/2019   Procedure: COLONOSCOPY WITH PROPOFOL;  Surgeon: [name], MD;  Location: [place];  Service: Endoscopy;  Laterality: N/A;  Latex Sleep apnea  . HYSTEROSCOPY WITH D & C N/A 05/20/2018   Procedure: DILATATION AND CURETTAGE /HYSTEROSCOPY;  Surgeon: [name], MD;  Location: [facility];  Service: Gynecology;  Laterality: N/A;  . INTRAUTERINE DEVICE (IUD) INSERTION N/A 05/20/2018   Procedure: INTRAUTERINE DEVICE (IUD) INSERTION;  Surgeon: [name], MD;  Location: [facility];  Service: Gynecology;  Laterality: N/A;  . WISDOM TOOTH EXTRACTION      Gynecologic History:  No LMP recorded. (Menstrual status:  IUD). Contraception: IUD Last Pap: 03/23/2018 results were: .no abnormalities  Family History:  Family History  Problem Relation Age of Onset  . Arthritis Mother   . Hyperlipidemia Mother   . Hypertension Mother   . Diabetes Mellitus I Father        DM I  . Breast cancer Maternal Grandmother 77  . Arthritis Paternal Grandmother   . Diabetes Mellitus II Paternal Grandmother   . Arthritis Paternal Grandfather   . Hyperlipidemia Paternal Grandfather   . Stroke Paternal Grandfather   . Hypertension Paternal Grandfather   . Diabetes Mellitus II Sister   . Diabetes Mellitus II Sister   . Hyperlipidemia Sister   . Hypertension Sister     Social History:  Social History   Socioeconomic History  . Marital status: Married    Spouse name: Not on file  . Number of children: 2  . Years of education: Not on file  . Highest education level: Not on file  Occupational History  . Occupation: Therapist, sports  Tobacco Use  . Smoking status: Never Smoker  . Smokeless tobacco: Never Used  Vaping Use  . Vaping Use: Never used  Substance and Sexual Activity  . Alcohol use: Yes    Alcohol/week: 2.0 standard drinks    Types: 2 Glasses of wine per week    Comment: OCC-WEEKENDS  . Drug use: No  . Sexual activity: Yes    Partners: Male    Birth control/protection: Surgical    Comment: vasectomy  Other Topics Concern  . Not on file  Social History Narrative   Married    2 kids    Works occup. [employer]    Social Determinants of Health   Financial Resource Strain:   . Difficulty of Paying Living Expenses: Not on file  Food Insecurity:   . Worried About Charity fundraiser in the Last Year: Not on file  . Ran Out of Food in the Last Year: Not on file  Transportation Needs:   . Lack of Transportation (Medical): Not on file  . Lack of Transportation (Non-Medical): Not on file  Physical Activity:   . Days of Exercise per Week: Not on file  . Minutes of Exercise per Session: Not on file   Stress:   . Feeling of Stress : Not on file  Social Connections:   . Frequency of Communication with Friends and Family: Not on file  . Frequency of Social Gatherings with Friends and Family: Not on file  . Attends Religious Services: Not on file  . Active Member of Clubs or Organizations: Not on file  . Attends Archivist Meetings: Not on file  . Marital Status: Not on file  Intimate Partner Violence:   . Fear of Current or Ex-Partner: Not on file  . Emotionally Abused: Not on file  . Physically Abused: Not on file  . Sexually Abused: Not on file    Allergies:  Allergies  Allergen Reactions  . Latex Itching    GLOVES (extended exposure)    Medications: Prior to Admission medications   Medication Sig Start Date End Date Taking? Authorizing Provider  docusate sodium (COLACE) 100 MG capsule Take 100 mg by mouth daily.    Yes [provider]  ELDERBERRY PO Take by mouth 2 (two) times daily. Gummies    Yes [provider]  ferrous sulfate 325 (65 FE) MG tablet Take 325 mg by mouth daily with breakfast.    Yes [provider]  hydrochlorothiazide (HYDRODIURIL) 25 MG tablet 1 tab daily by mouth 03/22/20  Yes [name], [name], MD  metoprolol succinate (TOPROL-XL) 25 MG 24 hr tablet Take 1 tablet (25 mg total) by mouth daily. 11/29/19  Yes [name], [name], MD  SUPER B COMPLEX/C PO  08/16/19  Yes [provider]  TURMERIC PO Take 500 mg by mouth daily. With ginger    Yes [provider]  VITAMIN D, ERGOCALCIFEROL, PO Take 5,000 Units by mouth daily.    Yes [provider]  ACCU-CHEK GUIDE test strip Qd E 11.9 accucheck test strips 03/22/20   [name], [name], MD  blood glucose meter kit and supplies Dispense based on patient and insurance preference. Use up to four times daily as directed. (FOR ICD-10 E10.9, E11.9). 05/03/19   [name], [name], MD  Cyanocobalamin (B-12) 1000 MCG TABS daily.   08/16/19   [provider]  diclofenac sodium (VOLTAREN) 1 % GEL Apply 2 g topically 4 (four) times daily. 12/02/18   [name], [name], MD  hydrocortisone 2.5 % cream Apply topically 2 (two) times daily. As needed neck, face Patient not taking: Reported on 09/17/2020 08/03/20   [name], [name], MD  ketoconazole (NIZORAL) 2 % cream daily as needed.  Patient not taking: Reported on 09/17/2020 04/20/19   [provider]  Lancets (ACCU-CHEK SOFT TOUCH) lancets Qd Use as instructed E11.9 03/22/20   [name], [name], MD  naproxen sodium (ALEVE) 220 MG tablet Take 220 mg by mouth  daily as needed.  Patient not taking: Reported on 09/17/2020    [provider]  triamcinolone cream (KENALOG) 0.1 % Apply 1 application topically 2 (two) times daily. Patient not taking: Reported on 09/17/2020 12/01/18   [name], [name], MD    Physical Exam Vitals: Blood pressure (!) 132/92, height '5\' 6"'  (1.676 m), weight 238 lb (108 kg).  General: NAD HEENT: normocephalic, anicteric Pulmonary: No increased work of breathing Extremities: no edema, erythema, or tenderness Neurologic: Grossly intact, normal gait Psychiatric: mood appropriate, affect full  US Transvaginal Non-OB  Result Date: 09/17/2020 Patient Name: OLIVETTE BECKMANN DOB: 1976-03-14 MRN: [id number] ULTRASOUND REPORT Location: [facility] Date of Service: 09/17/2020 Indications:F/U Left Ovarian Cyst and Fluid within the Uterus Findings: The uterus is retroverted and measures 7.9 x 6.5 x 6.1 cm. Echo texture is heterogenous with evidence of focal masses. Within the uterus are two intramural fibroids seen. The Endometrium measures 5.9 mm. There is anechoic fluid within the endometrial canal that is outlining an echogenic structure with blood flow. It measures 24.3 x 8.8 x 17.4 mm. The IUD is correctly placed within the uterus. Right Ovary measures 2.8 x 2.3 x 1.6 cm. It is normal in appearance. Left Ovary  measures 4.9 x 4.2 x 3.7 cm. It is not normal in appearance. There is a complex cyst with complex fluid seen. It measures 38.0 x 28.8 x 28.9 mm. No blood flow is seen within. There is free fluid seen around the left ovary measures 39.3 x 23.4 x 21.7 mm. Survey of the adnexa demonstrates no adnexal masses. There is no free fluid in the cul de sac. Impression: 1. There is anechoic fluid within the endometrial canal that outlines an echogenic structure with blood flow. 2. The IUD is correctly placed within the uterus. 3. There are two uterine fibroids seen. 4. Normal appearing right ovary. 5. There is a 3.8 cm complex cyst seen in the left ovary. Recommendations: 1.Clinical correlation with the patient's History and Physical Exam. Gweneth Dimitri, RT Images reviewed.  There is interval decrease in the size of the left ovarian cyst with surrounding free fluid suggestive of rupture.  There does appear to be an endometrial polyp visible on today's study which was not visualized on the prior study.  The patient does have a history of prior polypectomy for endometrial polyps in July of 2019.  [name], MD, [facility], [facility] 09/17/2020, 10:43 AM     Assessment: 44 y.o. G2P2002 follow up left ovarian cyst  Plan: Problem List Items Addressed This Visit    None    Visit Diagnoses    Left ovarian cyst    -  Primary   Endometrial polyp          1) Left ovarian cyst - interval decrease in size with apparent rupture with some surrounding free fluid left ovary.  No further follow up give patient also asymptomatic  2) Endometrial polyp - Schedule for hysteroscopy D&C, possible mirena removal replacement  3) Return if symptoms worsen or fail to improve.    [name], MD, [facility], [place]

## 2020-09-26 ENCOUNTER — Telehealth: Payer: Self-pay | Admitting: Obstetrics and Gynecology

## 2020-09-26 NOTE — Telephone Encounter (Signed)
Left a message for the patient to return the call.  

## 2020-10-04 ENCOUNTER — Telehealth: Payer: Self-pay | Admitting: Obstetrics and Gynecology

## 2020-10-04 NOTE — Telephone Encounter (Signed)
-----   Message from Vena Austria, MD sent at 09/22/2020  2:51 PM EST ----- Regarding: Surgery Surgery Booking Request Patient Full Name:  Kathleen Burke  MRN: [id number]  DOB: 13-Jan-1976  Surgeon: Vena Austria, MD  Requested Surgery Date and Time: 2-6 week Primary Diagnosis AND Code: Endometrial poyp Secondary Diagnosis and Code:  Surgical Procedure: Hysteroscopy D&C RNFA Requested?: No L&D Notification: No Admission Status: same day surgery Length of Surgery: 50 min Special Case Needs: No H&P: Yes Phone Interview???:  Yes Interpreter: No Medical Clearance:  No Special Scheduling Instructions: No Any known health/anesthesia issues, diabetes, sleep apnea, latex allergy, defibrillator/pacemaker?: No Acuity: P3   (P1 highest, P2 delay may cause harm, P3 low, elective gyn, P4 lowest)

## 2020-10-04 NOTE — Telephone Encounter (Signed)
Called patient to schedule Hysteroscopy D&C w [facility]  DOS 12/30  H&P 12/23 @ 11:30 [place]   Covid testing 12/28 @ 8-10:30, [address], drive up and wear mask. Advised pt to quarantine until DOS.  Pre-admit phone call appointment to be requested - date and time will be included on H&P paper work. Also all appointments will be updated on pt MyChart. Explained that this appointment has a call window. Based on the time scheduled will indicate if the call will be received within a 4 hour window before 1:00 or after.  Advised that pt may also receive calls from the hospital pharmacy and pre-service center.  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 No secondary insurance.

## 2020-10-05 ENCOUNTER — Other Ambulatory Visit: Payer: Self-pay

## 2020-10-05 ENCOUNTER — Telehealth (INDEPENDENT_AMBULATORY_CARE_PROVIDER_SITE_OTHER): Payer: BC Managed Care – PPO | Admitting: Internal Medicine

## 2020-10-05 ENCOUNTER — Encounter: Payer: Self-pay | Admitting: Internal Medicine

## 2020-10-05 VITALS — Ht 66.0 in | Wt 238.7 lb

## 2020-10-05 DIAGNOSIS — I10 Essential (primary) hypertension: Secondary | ICD-10-CM | POA: Diagnosis not present

## 2020-10-05 DIAGNOSIS — G5601 Carpal tunnel syndrome, right upper limb: Secondary | ICD-10-CM

## 2020-10-05 DIAGNOSIS — R000 Tachycardia, unspecified: Secondary | ICD-10-CM

## 2020-10-05 DIAGNOSIS — Z1329 Encounter for screening for other suspected endocrine disorder: Secondary | ICD-10-CM

## 2020-10-05 DIAGNOSIS — Z13818 Encounter for screening for other digestive system disorders: Secondary | ICD-10-CM

## 2020-10-05 DIAGNOSIS — Z1389 Encounter for screening for other disorder: Secondary | ICD-10-CM

## 2020-10-05 DIAGNOSIS — R7303 Prediabetes: Secondary | ICD-10-CM

## 2020-10-05 MED ORDER — METOPROLOL SUCCINATE ER 25 MG PO TB24: 25.0000 mg | ORAL_TABLET | Freq: Every day | ORAL | 3 refills | Status: DC

## 2020-10-05 NOTE — Progress Notes (Signed)
Telephone Note  I connected with Kathleen Burke  on 10/05/20 at  1:30 PM EST by telephone and verified that I am speaking with the correct person using two identifiers.  Location patient: work  Environmental manager or home office Persons participating in the virtual visit: patient, provider  I discussed the limitations of evaluation and management by telemedicine and the availability of in person appointments. The patient expressed understanding and agreed to proceed.   HPI: 1. HTN on hctz 25 mg qd and toprol xl 25 mg qd 120/80s at home 2. Prediabetes 6.2   ROS: See pertinent positives and negatives per HPI. MSK: denies neck   Past Medical History:  Diagnosis Date  . Anemia   . Anxiety 2014  . Arthritis    right arm, right knee  . Chicken pox   . Depression   . Frequent headaches   . GERD (gastroesophageal reflux disease)   . Hyperlipemia   . Hypertension   . Obesity (BMI 30-39.9)   . Pre-diabetes   . Sleep apnea    CPAP  . Trigger finger    MIDDLE FINGER RIGHT HAND SURGERY ON JULY 31ST 2019    Past Surgical History:  Procedure Laterality Date  . COLONOSCOPY WITH PROPOFOL N/A 07/18/2019   Procedure: COLONOSCOPY WITH PROPOFOL;  Surgeon: [name], MD;  Location: [place];  Service: Endoscopy;  Laterality: N/A;  Latex Sleep apnea  . HYSTEROSCOPY WITH D & C N/A 05/20/2018   Procedure: DILATATION AND CURETTAGE /HYSTEROSCOPY;  Surgeon: [name], MD;  Location: [facility];  Service: Gynecology;  Laterality: N/A;  . INTRAUTERINE DEVICE (IUD) INSERTION N/A 05/20/2018   Procedure: INTRAUTERINE DEVICE (IUD) INSERTION;  Surgeon: [name], MD;  Location: [facility];  Service: Gynecology;  Laterality: N/A;  . WISDOM TOOTH EXTRACTION       Current Outpatient Medications:  .  ACCU-CHEK GUIDE test strip, Qd E 11.9 accucheck test strips, Disp: 100 each, Rfl: 3 .  blood glucose meter kit and supplies, Dispense based on patient and insurance preference. Use  up to four times daily as directed. (FOR ICD-10 E10.9, E11.9)., Disp: 1 each, Rfl: 0 .  Cyanocobalamin (B-12) 1000 MCG TABS, daily. , Disp: , Rfl:  .  diclofenac sodium (VOLTAREN) 1 % GEL, Apply 2 g topically 4 (four) times daily., Disp: 100 g, Rfl: 11 .  docusate sodium (COLACE) 100 MG capsule, Take 100 mg by mouth daily. , Disp: , Rfl:  .  ELDERBERRY PO, Take by mouth 2 (two) times daily. Gummies , Disp: , Rfl:  .  ferrous sulfate 325 (65 FE) MG tablet, Take 325 mg by mouth daily with breakfast. , Disp: , Rfl:  .  hydrochlorothiazide (HYDRODIURIL) 25 MG tablet, 1 tab daily by mouth, Disp: 90 tablet, Rfl: 3 .  hydrocortisone 2.5 % cream, Apply topically 2 (two) times daily. As needed neck, face, Disp: 60 g, Rfl: 0 .  Lancets (ACCU-CHEK SOFT TOUCH) lancets, Qd Use as instructed E11.9, Disp: 100 each, Rfl: 12 .  naproxen sodium (ALEVE) 220 MG tablet, Take 220 mg by mouth daily as needed., Disp: , Rfl:  .  SUPER B COMPLEX/C PO, , Disp: , Rfl:  .  triamcinolone cream (KENALOG) 0.1 %, Apply 1 application topically 2 (two) times daily., Disp: 80 g, Rfl: 0 .  TURMERIC PO, Take 500 mg by mouth daily. With ginger , Disp: , Rfl:  .  VITAMIN D, ERGOCALCIFEROL, PO, Take 5,000 Units by mouth daily. , Disp: , Rfl:  .  ketoconazole (NIZORAL) 2 % cream, daily as needed.  (Patient not taking: No sig reported), Disp: , Rfl:  .  metoprolol succinate (TOPROL-XL) 25 MG 24 hr tablet, Take 1 tablet (25 mg total) by mouth daily., Disp: 90 tablet, Rfl: 3  EXAM:  VITALS per patient if applicable:  GENERAL: alert, oriented, appears well and in no acute distress  PSYCH/NEURO: pleasant and cooperative, no obvious depression or anxiety, speech and thought processing grossly intact  ASSESSMENT AND PLAN:  Discussed the following assessment and plan:  Prediabetes - Plan: Hemoglobin A1c  Primary hypertension - Plan: Comprehensive metabolic panel, Lipid panel, CBC with Differential/Platelet hctz 25 mg qd  toprol 25  mg qd   Carpal tunnel syndrome of right wrist Noted on previous testing   HM Flu shot utd utd tdaphad at workin 2021 check date for me 08/20/20  Immune hep B and MMR covid 2/2 had and pfizer 08/2020 1 Consider pna 23 vaccine   Pap [facility] 03/23/18 neg no HPV testing done Endometrial Bx 05/20/18 negative with IUD now Has IUD  DUB 10/25/20 uterine fibroids x 2, IUD out, left 3.8 cyst   mammo8/23/21negative  Colonoscopy 07/18/2019 normal [facility]ohl repeat in 10 years  Declines HIV testing rec healthy diet and exercise rec take D3 2000 IUD daily  Ortho seeing for shoulder/ arm pain tried steroid injection, massage helped consider MRI in the past   -we discussed possible serious and likely etiologies, options for evaluation and workup, limitations of telemedicine visit vs in person visit, treatment, treatment risks and precautions.    I discussed the assessment and treatment plan with the patient. The patient was provided an opportunity to ask questions and all were answered. The patient agreed with the plan and demonstrated an understanding of the instructions.    Time spent 20 minutes  Delorise Jackson, MD

## 2020-10-11 ENCOUNTER — Other Ambulatory Visit: Payer: BC Managed Care – PPO

## 2020-10-16 ENCOUNTER — Other Ambulatory Visit: Payer: Self-pay

## 2020-10-16 ENCOUNTER — Other Ambulatory Visit
Admission: RE | Admit: 2020-10-16 | Discharge: 2020-10-16 | Disposition: A | Discharge status: Home / Self Care | Payer: BC Managed Care – PPO | Source: Ambulatory Visit | Attending: Obstetrics and Gynecology | Admitting: Obstetrics and Gynecology

## 2020-10-16 NOTE — Patient Instructions (Signed)
Your procedure is scheduled on: Thursday October 25, 2020.  Report to Day Surgery inside [facility] 2nd floor (stop by Registration desk first). To find out your arrival time please call [phone] between 1PM - 3PM on Wednesday October 24, 2020.  Remember: Instructions that are not followed completely may result in serious medical risk,  up to and including death, or upon the discretion of your surgeon and anesthesiologist your  surgery may need to be rescheduled.     _X__ 1. Do not eat food after midnight the night before your procedure.                 No chewing gum or hard candies. You may drink clear liquids up to 2 hours                 before you are scheduled to arrive for your surgery- DO not drink clear                 liquids within 2 hours of the start of your surgery.                 Clear Liquids include:  water, apple juice without pulp, clear Gatorade, G2 or                  Gatorade Zero (avoid Red/Purple/Blue), Black Coffee or Tea (Do not add                 anything to coffee or tea).  __X__2.  On the morning of surgery brush your teeth with toothpaste and water, you                may rinse your mouth with mouthwash if you wish.  Do not swallow any toothpaste of mouthwash.     _X__ 3.  No Alcohol for 24 hours before or after surgery.   _X__ 4.  Do Not Smoke or use e-cigarettes For 24 Hours Prior to Your Surgery.                 Do not use any chewable tobacco products for at least 6 hours prior to                 Surgery.  _X__  5.  Do not use any recreational drugs (marijuana, cocaine, heroin, ecstasy, MDMA or other)                For at least one week prior to your surgery.  Combination of these drugs with anesthesia                May have life threatening results.  __X__ 6.  Notify your doctor if there is any change in your medical condition      (cold, fever, infections).     Do not wear jewelry, make-up, hairpins, clips or  nail polish. Do not wear lotions, powders, or perfumes. You may wear deodorant. Do not shave 48 hours prior to surgery. Men may shave face and neck. Do not bring valuables to the hospital.    [facility], [facility], [facility] is not responsible for any belongings or valuables.  Contacts, dentures or bridgework may not be worn into surgery. Leave your suitcase in the car. After surgery it may be brought to your room. For patients admitted to the hospital, discharge time is determined by your treatment team.   Patients discharged the day of surgery will not be allowed to drive home.  Make arrangements for someone to be with you for the first 24 hours of your Same Day Discharge.   __X__ Take these medicines the morning of surgery with A SIP OF WATER:    1. metoprolol succinate (TOPROL-XL) 25 MG   ____ Fleet Enema (as directed)   ____ Use CHG Soap (or wipes) as directed  ____ Use Benzoyl Peroxide Gel as instructed  ____ Use inhalers on the day of surgery  ____ Stop metformin 2 days prior to surgery    ____ Take 1/2 of usual insulin dose the night before surgery. No insulin the morning          of surgery.   ____ Stop Coumadin/Plavix/aspirin   __X__ Stop Anti-inflammatories such as naproxen sodium (ALEVE), Ibuprofen, Advil, aspirin and or BC powders.    __X__ Stop supplements until after surgery.  Such as TURMERIC and ELDERBERRY    __X__ Do not start any herbal supplements before your procedure.     __X__ Bring your CPAP machine the day of your surgery if in good standing.   If you have any questions regarding your pre-procedure instructions,  Please call Pre-admit Testing at [phone].

## 2020-10-18 ENCOUNTER — Ambulatory Visit (INDEPENDENT_AMBULATORY_CARE_PROVIDER_SITE_OTHER): Payer: BC Managed Care – PPO | Admitting: Obstetrics and Gynecology

## 2020-10-18 ENCOUNTER — Other Ambulatory Visit: Payer: Self-pay

## 2020-10-18 ENCOUNTER — Encounter: Payer: Self-pay | Admitting: Obstetrics and Gynecology

## 2020-10-18 VITALS — BP 132/84 | Ht 66.0 in | Wt 245.0 lb

## 2020-10-18 DIAGNOSIS — Z01818 Encounter for other preprocedural examination: Secondary | ICD-10-CM

## 2020-10-18 DIAGNOSIS — N840 Polyp of corpus uteri: Secondary | ICD-10-CM | POA: Diagnosis not present

## 2020-10-18 DIAGNOSIS — Z531 Procedure and treatment not carried out because of patient's decision for reasons of belief and group pressure: Secondary | ICD-10-CM | POA: Diagnosis not present

## 2020-10-18 NOTE — Progress Notes (Signed)
Obstetrics & Gynecology Surgery H&P    Chief Complaint: Scheduled Surgery   History of Present Illness: Patient is a 44 y.o. [id number] presenting for scheduled hystereoscopy, D&C, possible Mirena IUD replacement, for the treatment or further evaluation of endometrial polyp.   Prior Treatments prior to proceeding with surgery include: Expectant management and follow up imaging of left ovarian cyst  Preoperative Pap: 03/23/2018 NILM HPV negative Preoperative Endometrial biopsy: N/A focal lesion Preoperative Ultrasound: 09/17/2020 Interval decrease and likely rupture of simple left ovarian cyst, evidence of endometrial polyp not visualized on previous imaging   Review of Systems:10 point review of systems  Past Medical History:  Patient Active Problem List   Diagnosis Date Noted  . 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 Prediabetes 03/22/2020  . Vitamin D deficiency 03/22/2020  . CTS (carpal tunnel syndrome) 12/30/2019    Moderate to severe right mild on left per EMG/NCS     . Low back pain 09/28/2019  . Special screening for malignant neoplasms, colon   . Dizziness 06/16/2019  . Numbness and tingling in both hands 06/16/2019  . OSA (obstructive sleep apnea) 06/16/2019    Moderate osa on cpap     . Nonintractable headache 06/16/2019  . Edema 06/16/2019  . Eczema 12/01/2018  . Acute pain of right shoulder 12/01/2018  . Epidermal inclusion cyst 12/01/2018  . Right arm pain 12/01/2018  . Sinus tachycardia 12/01/2018  . HTN (hypertension) 06/22/2018  . Trigger finger, right 06/22/2018  . No blood products 05/11/2018    Jehovah's Witness no blood products   . Low vitamin D level 03/23/2018    Was 25 on 03/04/2018-taking vitamin D3 5000 IU daily   . Microcytic hypochromic anemia 03/23/2018  . Anxiety and depression 01/14/2016  . Obesity (BMI 30-39.9) 01/14/2016  . Annual physical exam 01/14/2016    Past Surgical History:  Past Surgical History:  Procedure Laterality Date   . COLONOSCOPY WITH PROPOFOL N/A 07/18/2019   Procedure: COLONOSCOPY WITH PROPOFOL;  Surgeon: [name], MD;  Location: [place];  Service: Endoscopy;  Laterality: N/A;  Latex Sleep apnea  . HYSTEROSCOPY WITH D & C N/A 05/20/2018   Procedure: DILATATION AND CURETTAGE /HYSTEROSCOPY;  Surgeon: [name], MD;  Location: [facility];  Service: Gynecology;  Laterality: N/A;  . INTRAUTERINE DEVICE (IUD) INSERTION N/A 05/20/2018   Procedure: INTRAUTERINE DEVICE (IUD) INSERTION;  Surgeon: [name], MD;  Location: [facility];  Service: Gynecology;  Laterality: N/A;  . WISDOM TOOTH EXTRACTION      Family History:  Family History  Problem Relation Age of Onset  . Arthritis Mother   . Hyperlipidemia Mother   . Hypertension Mother   . Diabetes Mellitus I Father        DM I  . Breast cancer Maternal Grandmother 51  . Arthritis Paternal Grandmother   . Diabetes Mellitus II Paternal Grandmother   . Arthritis Paternal Grandfather   . Hyperlipidemia Paternal Grandfather   . Stroke Paternal Grandfather   . Hypertension Paternal Grandfather   . Diabetes Mellitus II Sister   . Diabetes Mellitus II Sister   . Hyperlipidemia Sister   . Hypertension Sister     Social History:  Social History   Socioeconomic History  . Marital status: Married    Spouse name: Not on file  . Number of children: 2  . Years of education: Not on file  . Highest education level: Not on file  Occupational History  . Occupation: Therapist, sports  Tobacco Use  .  Smoking status: Never Smoker  . Smokeless tobacco: Never Used  Vaping Use  . Vaping Use: Never used  Substance and Sexual Activity  . Alcohol use: Yes    Alcohol/week: 2.0 standard drinks    Types: 2 Glasses of wine per week    Comment: OCC-WEEKENDS  . Drug use: No  . Sexual activity: Yes    Partners: Male    Birth control/protection: Surgical    Comment: vasectomy  Other Topics Concern  . Not on file  Social History Narrative   Married     2 kids    Works occup. [employer]    Social Determinants of Health   Financial Resource Strain: Not on file  Food Insecurity: Not on file  Transportation Needs: Not on file  Physical Activity: Not on file  Stress: Not on file  Social Connections: Not on file  Intimate Partner Violence: Not on file    Allergies:  Allergies  Allergen Reactions  . Latex Itching    GLOVES (extended exposure)    Medications: Prior to Admission medications   Medication Sig Start Date End Date Taking? Authorizing Provider  ACCU-CHEK GUIDE test strip Qd E 11.9 accucheck test strips 03/22/20   [name], [name], MD  b complex vitamins capsule Take 1 capsule by mouth daily.    [provider]  blood glucose meter kit and supplies Dispense based on patient and insurance preference. Use up to four times daily as directed. (FOR ICD-10 E10.9, E11.9). 05/03/19   [name], [name], MD  Cholecalciferol (VITAMIN D3) 125 MCG (5000 UT) CAPS Take 5,000 Units by mouth daily.    [provider]  Cyanocobalamin (B-12) 1000 MCG TABS Take 1,000 mcg by mouth daily. 08/16/19   [provider]  diclofenac sodium (VOLTAREN) 1 % GEL Apply 2 g topically 4 (four) times daily. Patient taking differently: Apply 2 g topically 4 (four) times daily as needed (pain). 12/02/18   [name], [name], MD  docusate sodium (COLACE) 100 MG capsule Take 100 mg by mouth daily.     [provider]  ELDERBERRY PO Take 1 each by mouth 2 (two) times daily. Gummies    [provider]  ferrous sulfate 325 (65 FE) MG tablet Take 325 mg by mouth daily with breakfast.     [provider]  hydrochlorothiazide (HYDRODIURIL) 25 MG tablet 1 tab daily by mouth Patient taking differently: Take 25 mg by mouth daily. 1 tab daily by mouth 03/22/20   [name], [name], MD  hydrocortisone 2.5 % cream Apply topically 2 (two) times daily. As needed neck, face Patient taking differently:  Apply 1 application topically 2 (two) times daily as needed (face and neck). 08/03/20   [name], [name], MD  Lancets (ACCU-CHEK SOFT TOUCH) lancets Qd Use as instructed E11.9 03/22/20   [name], [name], MD  methocarbamol (ROBAXIN) 500 MG tablet Take 500 mg by mouth every 6 (six) hours as needed for muscle spasms.    [provider]  metoprolol succinate (TOPROL-XL) 25 MG 24 hr tablet Take 1 tablet (25 mg total) by mouth daily. 10/05/20   [name], [name], MD  naproxen sodium (ALEVE) 220 MG tablet Take 220 mg by mouth daily as needed (pain).    [provider]  olmesartan (BENICAR) 20 MG tablet Take 20 mg by mouth daily.    [provider]  triamcinolone cream (KENALOG) 0.1 % Apply 1 application topically 2 (two) times daily. Patient taking differently: Apply 1 application  topically 2 (two) times daily as needed (irritation). 12/01/18   [name], [name], MD  TURMERIC PO Take 500 mg by mouth daily. With ginger     [provider]    Physical Exam Vitals:  Blood pressure 132/84, height _0  (1.676 m), weight 245 lb (111.1 kg).  General: NAD HEENT: normocephalic, anicteric Pulmonary: No increased work of breathing, CTAB Cardiovascular: RRR, distal pulses 2+ Abdomen: soft, non-tender, non-distended Extremities: no edema, erythema, or tenderness Neurologic: Grossly intact Psychiatric: mood appropriate, affect full  Imaging No results found.  Assessment: 44 y.o. [id number] presenting for scheduled hysteroscopy, D&C  Plan: 1) I have discussed with the patient the indications for the procedure. Included in the discussion were the options of therapy, as wall as their individual risks, benefits, and complications. Ample time was given to answer all questions.   In office pipelle biopsy generally provides comparable results to [facility], however this sampling modality may miss focal lesions if these were previously documented on ultrasound.   It is because of the potential to miss focal lesions that hysteroscopy D&C is also warranted in patient with continued postmenopausal bleeding that is not self limited regardless of prior in office biopsy results or ultrasound findings.  She understands that the risk of continued observation include worsening bleeding or worsening of any underlying pathology.  The choices include: 1. Doing nothing but following her symptoms 2. Attempts at hormonal manipulation with either BCP or Depo-Provera for premenopausal patients with no concern for focal lesion or endometrial pathology 3. D&C/hysteroscopy. 4. Endometrial ablation via Novasure or other techniques for premenopausal patients with no concern for focal lesion or endometrial pathology  5. As final resort, hysterectomy. After consideration of her history and findings, mutual decision has been made to proceed with D+C/hysteroscopy. While the incidence is low, the risks from this surgery include, but are not limited to, the risks of anesthesia, hemorrhage, infection, perforation, and injury to adjacent structures including bowel, bladder and blood vessels.   2) Routine postoperative instructions were reviewed with the patient and her family in detail today including the expected length of recovery and likely postoperative course.  The patient concurred with the proposed plan, giving informed written consent for the surgery today.  Patient instructed on the importance of being NPO after midnight prior to her procedure.  If warranted preoperative prophylactic antibiotics and SCDs ordered on call to the OR to meet SCIP guidelines and adhere to recommendation laid forth in [facility] Number 104 May 2009  "Antibiotic Prophylaxis for Gynecologic Procedures".     [name], MD, [facility], [facility] 10/18/2020, 10:21 AM

## 2020-10-18 NOTE — H&P (View-Only) (Signed)
 Obstetrics & Gynecology Surgery H&P    Chief Complaint: Scheduled Surgery   History of Present Illness: Patient is a Kathleen Burke presenting for scheduled hystereoscopy, D&C, possible Mirena IUD replacement, for the treatment or further evaluation of endometrial polyp.   Prior Treatments prior to proceeding with surgery include: Expectant management and follow up imaging of left ovarian cyst  Preoperative Pap: 03/23/2018 NILM HPV negative Preoperative Endometrial biopsy: N/A focal lesion Preoperative Ultrasound: 09/17/2020 Interval decrease and likely rupture of simple left ovarian cyst, evidence of endometrial polyp not visualized on previous imaging   Review of Systems:10 point review of systems  Past Medical History:  Patient Active Problem List   Diagnosis Date Noted  . 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 Prediabetes 03/22/2020  . Vitamin D deficiency 03/22/2020  . CTS (carpal tunnel syndrome) 12/30/2019    Moderate to severe right mild on left per EMG/NCS     . Low back pain 09/28/2019  . Special screening for malignant neoplasms, colon   . Dizziness 06/16/2019  . Numbness and tingling in both hands 06/16/2019  . OSA (obstructive sleep apnea) 06/16/2019    Moderate osa on cpap     . Nonintractable headache 06/16/2019  . Edema 06/16/2019  . Eczema 12/01/2018  . Acute pain of right shoulder 12/01/2018  . Epidermal inclusion cyst 12/01/2018  . Right arm pain 12/01/2018  . Sinus tachycardia 12/01/2018  . HTN (hypertension) 06/22/2018  . Trigger finger, right 06/22/2018  . No blood products 05/11/2018    Jehovah's Witness no blood products   . Low vitamin D level 03/23/2018    Was 25 on 03/04/2018-taking vitamin D3 5000 IU daily   . Microcytic hypochromic anemia 03/23/2018  . Anxiety and depression 01/14/2016  . Obesity (BMI 30-39.9) 01/14/2016  . Annual physical exam 01/14/2016    Past Surgical History:  Past Surgical History:  Procedure Laterality Date   . COLONOSCOPY WITH PROPOFOL N/A 07/18/2019   Procedure: COLONOSCOPY WITH PROPOFOL;  Surgeon: Wohl, Darren, MD;  Location: [facility];  Service: Endoscopy;  Laterality: N/A;  Latex Sleep apnea  . HYSTEROSCOPY WITH D & C N/A 05/20/2018   Procedure: DILATATION AND CURETTAGE /HYSTEROSCOPY;  Surgeon: Kedric Bumgarner, MD;  Location: [facility];  Service: Gynecology;  Laterality: N/A;  . INTRAUTERINE DEVICE (IUD) INSERTION N/A 05/20/2018   Procedure: INTRAUTERINE DEVICE (IUD) INSERTION;  Surgeon: Conita Amenta, MD;  Location: [facility];  Service: Gynecology;  Laterality: N/A;  . WISDOM TOOTH EXTRACTION      Family History:  Family History  Problem Relation Age of Onset  . Arthritis Mother   . Hyperlipidemia Mother   . Hypertension Mother   . Diabetes Mellitus I Father        DM I  . Breast cancer Maternal Grandmother 50  . Arthritis Paternal Grandmother   . Diabetes Mellitus II Paternal Grandmother   . Arthritis Paternal Grandfather   . Hyperlipidemia Paternal Grandfather   . Stroke Paternal Grandfather   . Hypertension Paternal Grandfather   . Diabetes Mellitus II Sister   . Diabetes Mellitus II Sister   . Hyperlipidemia Sister   . Hypertension Sister     Social History:  Social History   Socioeconomic History  . Marital status: Married    Spouse name: Not on file  . Number of children: 2  . Years of education: Not on file  . Highest education level: Not on file  Occupational History  . Occupation: RN  Tobacco Use  .   Smoking status: Never Smoker  . Smokeless tobacco: Never Used  Vaping Use  . Vaping Use: Never used  Substance and Sexual Activity  . Alcohol use: Yes    Alcohol/week: 2.0 standard drinks    Types: 2 Glasses of wine per week    Comment: OCC-WEEKENDS  . Drug use: No  . Sexual activity: Yes    Partners: Male    Birth control/protection: Surgical    Comment: vasectomy  Other Topics Concern  . Not on file  Social History Narrative   Married     2 kids    Works occup. [employer]    Social Determinants of Health   Financial Resource Strain: Not on file  Food Insecurity: Not on file  Transportation Needs: Not on file  Physical Activity: Not on file  Stress: Not on file  Social Connections: Not on file  Intimate Partner Violence: Not on file    Allergies:  Allergies  Allergen Reactions  . Latex Itching    GLOVES (extended exposure)    Medications: Prior to Admission medications   Medication Sig Start Date End Date Taking? Authorizing Provider  ACCU-CHEK GUIDE test strip Qd E 11.9 accucheck test strips 03/22/20   [name], Tracy N, MD  b complex vitamins capsule Take 1 capsule by mouth daily.    [provider]  blood glucose meter kit and supplies Dispense based on patient and insurance preference. Use up to four times daily as directed. (FOR ICD-10 E10.9, E11.9). 05/03/19   [name], Tracy N, MD  Cholecalciferol (VITAMIN D3) 125 MCG (5000 UT) CAPS Take 5,000 Units by mouth daily.    [provider]  Cyanocobalamin (B-12) 1000 MCG TABS Take 1,000 mcg by mouth daily. 08/16/19   [provider]  diclofenac sodium (VOLTAREN) 1 % GEL Apply 2 g topically 4 (four) times daily. Patient taking differently: Apply 2 g topically 4 (four) times daily as needed (pain). 12/02/18   [name], Tracy N, MD  docusate sodium (COLACE) 100 MG capsule Take 100 mg by mouth daily.     [provider]  ELDERBERRY PO Take 1 each by mouth 2 (two) times daily. Gummies    [provider]  ferrous sulfate 325 (65 FE) MG tablet Take 325 mg by mouth daily with breakfast.     [provider]  hydrochlorothiazide (HYDRODIURIL) 25 MG tablet 1 tab daily by mouth Patient taking differently: Take 25 mg by mouth daily. 1 tab daily by mouth 03/22/20   [name], Tracy N, MD  hydrocortisone 2.5 % cream Apply topically 2 (two) times daily. As needed neck, face Patient taking differently:  Apply 1 application topically 2 (two) times daily as needed (face and neck). 08/03/20   [name], Tracy N, MD  Lancets (ACCU-CHEK SOFT TOUCH) lancets Qd Use as instructed E11.9 03/22/20   [name], Tracy N, MD  methocarbamol (ROBAXIN) 500 MG tablet Take 500 mg by mouth every 6 (six) hours as needed for muscle spasms.    [provider]  metoprolol succinate (TOPROL-XL) 25 MG 24 hr tablet Take 1 tablet (25 mg total) by mouth daily. 10/05/20   [name], Tracy N, MD  naproxen sodium (ALEVE) 220 MG tablet Take 220 mg by mouth daily as needed (pain).    [provider]  olmesartan (BENICAR) 20 MG tablet Take 20 mg by mouth daily.    [provider]  triamcinolone cream (KENALOG) 0.1 % Apply 1 application topically 2 (two) times daily. Patient taking differently: Apply 1 application   topically 2 (two) times daily as needed (irritation). 12/01/18   [name], Tracy N, MD  TURMERIC PO Take 500 mg by mouth daily. With ginger     [provider]    Physical Exam Vitals:  Blood pressure 132/84, height 5' 6" (1.676 m), weight 245 lb (111.1 kg).  General: NAD HEENT: normocephalic, anicteric Pulmonary: No increased work of breathing, CTAB Cardiovascular: RRR, distal pulses 2+ Abdomen: soft, non-tender, non-distended Extremities: no edema, erythema, or tenderness Neurologic: Grossly intact Psychiatric: mood appropriate, affect full  Imaging No results found.  Assessment: Kathleen Burke presenting for scheduled hysteroscopy, D&C  Plan: 1) I have discussed with the patient the indications for the procedure. Included in the discussion were the options of therapy, as wall as their individual risks, benefits, and complications. Ample time was given to answer all questions.   In office pipelle biopsy generally provides comparable results to D&C, however this sampling modality may miss focal lesions if these were previously documented on ultrasound.   It is because of the potential to miss focal lesions that hysteroscopy D&C is also warranted in patient with continued postmenopausal bleeding that is not self limited regardless of prior in office biopsy results or ultrasound findings.  She understands that the risk of continued observation include worsening bleeding or worsening of any underlying pathology.  The choices include: 1. Doing nothing but following her symptoms 2. Attempts at hormonal manipulation with either BCP or Depo-Provera for premenopausal patients with no concern for focal lesion or endometrial pathology 3. D&C/hysteroscopy. 4. Endometrial ablation via Novasure or other techniques for premenopausal patients with no concern for focal lesion or endometrial pathology  5. As final resort, hysterectomy. After consideration of her history and findings, mutual decision has been made to proceed with D+C/hysteroscopy. While the incidence is low, the risks from this surgery include, but are not limited to, the risks of anesthesia, hemorrhage, infection, perforation, and injury to adjacent structures including bowel, bladder and blood vessels.   2) Routine postoperative instructions were reviewed with the patient and her family in detail today including the expected length of recovery and likely postoperative course.  The patient concurred with the proposed plan, giving informed written consent for the surgery today.  Patient instructed on the importance of being NPO after midnight prior to her procedure.  If warranted preoperative prophylactic antibiotics and SCDs ordered on call to the OR to meet SCIP guidelines and adhere to recommendation laid forth in [employer] Practice Bulletin Number 104 May 2009  "Antibiotic Prophylaxis for Gynecologic Procedures".     Shanika Levings, MD, FACOG [facility], [facility] 10/18/2020, 10:21 AM   

## 2020-10-19 ENCOUNTER — Other Ambulatory Visit: Payer: BC Managed Care – PPO

## 2020-10-23 ENCOUNTER — Other Ambulatory Visit
Admission: RE | Admit: 2020-10-23 | Discharge: 2020-10-23 | Disposition: A | Discharge status: Home / Self Care | Payer: BC Managed Care – PPO | Source: Ambulatory Visit | Attending: Obstetrics and Gynecology | Admitting: Obstetrics and Gynecology

## 2020-10-23 ENCOUNTER — Other Ambulatory Visit: Payer: Self-pay

## 2020-10-23 DIAGNOSIS — Z79899 Other long term (current) drug therapy: Secondary | ICD-10-CM | POA: Diagnosis not present

## 2020-10-23 DIAGNOSIS — Z01812 Encounter for preprocedural laboratory examination: Secondary | ICD-10-CM | POA: Insufficient documentation

## 2020-10-23 DIAGNOSIS — Z20822 Contact with and (suspected) exposure to covid-19: Secondary | ICD-10-CM | POA: Insufficient documentation

## 2020-10-23 DIAGNOSIS — N840 Polyp of corpus uteri: Secondary | ICD-10-CM | POA: Diagnosis not present

## 2020-10-23 LAB — SARS CORONAVIRUS 2 (TAT 6-24 HRS): SARS Coronavirus 2: NEGATIVE

## 2020-10-25 ENCOUNTER — Encounter
Admission: RE | Disposition: A | Discharge status: Home / Self Care | Payer: Self-pay | Source: Home / Self Care | Attending: Obstetrics and Gynecology

## 2020-10-25 ENCOUNTER — Ambulatory Visit: Payer: BC Managed Care – PPO | Admitting: Certified Registered Nurse Anesthetist

## 2020-10-25 ENCOUNTER — Encounter: Payer: Self-pay | Admitting: Obstetrics and Gynecology

## 2020-10-25 ENCOUNTER — Ambulatory Visit
Admission: RE | Admit: 2020-10-25 | Discharge: 2020-10-25 | Disposition: A | Discharge status: Home / Self Care | Payer: BC Managed Care – PPO | Attending: Obstetrics and Gynecology | Admitting: Obstetrics and Gynecology

## 2020-10-25 ENCOUNTER — Other Ambulatory Visit: Payer: Self-pay

## 2020-10-25 DIAGNOSIS — Z30433 Encounter for removal and reinsertion of intrauterine contraceptive device: Secondary | ICD-10-CM

## 2020-10-25 DIAGNOSIS — Z79899 Other long term (current) drug therapy: Secondary | ICD-10-CM | POA: Insufficient documentation

## 2020-10-25 DIAGNOSIS — N840 Polyp of corpus uteri: Secondary | ICD-10-CM

## 2020-10-25 DIAGNOSIS — Z20822 Contact with and (suspected) exposure to covid-19: Secondary | ICD-10-CM | POA: Insufficient documentation

## 2020-10-25 HISTORY — PX: INTRAUTERINE DEVICE (IUD) INSERTION: SHX5877

## 2020-10-25 HISTORY — PX: HYSTEROSCOPY WITH D & C: SHX1775

## 2020-10-25 LAB — POCT PREGNANCY, URINE: Preg Test, Ur: NEGATIVE

## 2020-10-25 LAB — GLUCOSE, CAPILLARY
Glucose-Capillary: 85 mg/dL (ref 70–99)
Glucose-Capillary: 89 mg/dL (ref 70–99)

## 2020-10-25 SURGERY — DILATATION AND CURETTAGE /HYSTEROSCOPY
Anesthesia: General

## 2020-10-25 MED ORDER — ORAL CARE MOUTH RINSE: 15.0000 mL | Freq: Once | OROMUCOSAL | Status: AC

## 2020-10-25 MED ORDER — CHLORHEXIDINE GLUCONATE 0.12 % MT SOLN
OROMUCOSAL | Status: AC
  Administered 2020-10-25: 15 mL via OROMUCOSAL
  Filled 2020-10-25: qty 15

## 2020-10-25 MED ORDER — HYDROCODONE-ACETAMINOPHEN 5-325 MG PO TABS: 1.0000 | ORAL_TABLET | Freq: Four times a day (QID) | ORAL | 0 refills | Status: DC | PRN

## 2020-10-25 MED ORDER — DEXMEDETOMIDINE (PRECEDEX) IN NS 20 MCG/5ML (4 MCG/ML) IV SYRINGE
PREFILLED_SYRINGE | INTRAVENOUS | Status: AC
  Filled 2020-10-25: qty 5

## 2020-10-25 MED ORDER — OXYCODONE HCL 5 MG/5ML PO SOLN
5.0000 mg | Freq: Once | ORAL | Status: AC | PRN
Start: 2020-10-25 — End: 2020-10-25

## 2020-10-25 MED ORDER — FENTANYL CITRATE (PF) 100 MCG/2ML IJ SOLN: 25.0000 ug | INTRAMUSCULAR | Status: DC | PRN

## 2020-10-25 MED ORDER — ROCURONIUM BROMIDE 100 MG/10ML IV SOLN
INTRAVENOUS | Status: DC | PRN
  Administered 2020-10-25: 50 mg via INTRAVENOUS

## 2020-10-25 MED ORDER — MIDAZOLAM HCL 2 MG/2ML IJ SOLN
INTRAMUSCULAR | Status: AC
  Filled 2020-10-25: qty 2

## 2020-10-25 MED ORDER — ESMOLOL HCL 100 MG/10ML IV SOLN
INTRAVENOUS | Status: DC | PRN
  Administered 2020-10-25 (×2): 30 mg via INTRAVENOUS

## 2020-10-25 MED ORDER — POVIDONE-IODINE 10 % EX SWAB: 2.0000 "application " | Freq: Once | CUTANEOUS | Status: DC

## 2020-10-25 MED ORDER — ACETAMINOPHEN 10 MG/ML IV SOLN
INTRAVENOUS | Status: DC | PRN
  Administered 2020-10-25: 1000 mg via INTRAVENOUS

## 2020-10-25 MED ORDER — DEXMEDETOMIDINE HCL IN NACL 200 MCG/50ML IV SOLN
INTRAVENOUS | Status: DC | PRN
  Administered 2020-10-25 (×2): 10 ug via INTRAVENOUS

## 2020-10-25 MED ORDER — MIDAZOLAM HCL 2 MG/2ML IJ SOLN: INTRAMUSCULAR | Status: DC | PRN

## 2020-10-25 MED ORDER — PROPOFOL 10 MG/ML IV BOLUS
INTRAVENOUS | Status: DC | PRN
  Administered 2020-10-25: 200 mg via INTRAVENOUS

## 2020-10-25 MED ORDER — LIDOCAINE HCL (CARDIAC) PF 100 MG/5ML IV SOSY
PREFILLED_SYRINGE | INTRAVENOUS | Status: DC | PRN
  Administered 2020-10-25: 50 mg via INTRAVENOUS

## 2020-10-25 MED ORDER — CHLORHEXIDINE GLUCONATE 0.12 % MT SOLN: 15.0000 mL | Freq: Once | OROMUCOSAL | Status: AC

## 2020-10-25 MED ORDER — ACETAMINOPHEN 10 MG/ML IV SOLN
INTRAVENOUS | Status: AC
  Filled 2020-10-25: qty 100

## 2020-10-25 MED ORDER — LEVONORGESTREL 20 MCG/24HR IU IUD
INTRAUTERINE_SYSTEM | INTRAUTERINE | Status: AC
  Filled 2020-10-25: qty 1

## 2020-10-25 MED ORDER — FENTANYL CITRATE (PF) 100 MCG/2ML IJ SOLN
INTRAMUSCULAR | Status: DC | PRN
  Administered 2020-10-25: 100 ug via INTRAVENOUS

## 2020-10-25 MED ORDER — DEXAMETHASONE SODIUM PHOSPHATE 10 MG/ML IJ SOLN
INTRAMUSCULAR | Status: DC | PRN
  Administered 2020-10-25: 5 mg via INTRAVENOUS

## 2020-10-25 MED ORDER — OXYCODONE HCL 5 MG PO TABS
ORAL_TABLET | ORAL | Status: AC
  Administered 2020-10-25: 5 mg via ORAL
  Filled 2020-10-25: qty 1

## 2020-10-25 MED ORDER — PROMETHAZINE HCL 25 MG/ML IJ SOLN: 6.2500 mg | INTRAMUSCULAR | Status: DC | PRN

## 2020-10-25 MED ORDER — FAMOTIDINE 20 MG PO TABS
ORAL_TABLET | ORAL | Status: AC
  Administered 2020-10-25: 20 mg
  Filled 2020-10-25: qty 1

## 2020-10-25 MED ORDER — SUGAMMADEX SODIUM 200 MG/2ML IV SOLN
INTRAVENOUS | Status: DC | PRN
  Administered 2020-10-25: 300 mg via INTRAVENOUS

## 2020-10-25 MED ORDER — LACTATED RINGERS IV SOLN: INTRAVENOUS | Status: DC | PRN

## 2020-10-25 MED ORDER — SILVER NITRATE-POT NITRATE 75-25 % EX MISC
CUTANEOUS | Status: AC
  Filled 2020-10-25: qty 10

## 2020-10-25 MED ORDER — ONDANSETRON HCL 4 MG/2ML IJ SOLN
INTRAMUSCULAR | Status: DC | PRN
  Administered 2020-10-25: 4 mg via INTRAVENOUS

## 2020-10-25 MED ORDER — PROPOFOL 10 MG/ML IV BOLUS
INTRAVENOUS | Status: AC
  Filled 2020-10-25: qty 40

## 2020-10-25 MED ORDER — SODIUM CHLORIDE 0.9 % IV SOLN: INTRAVENOUS | Status: DC

## 2020-10-25 MED ORDER — FENTANYL CITRATE (PF) 100 MCG/2ML IJ SOLN
INTRAMUSCULAR | Status: AC
  Filled 2020-10-25: qty 2

## 2020-10-25 MED ORDER — OXYCODONE HCL 5 MG PO TABS: 5.0000 mg | ORAL_TABLET | Freq: Once | ORAL | Status: AC | PRN

## 2020-10-25 MED ORDER — LACTATED RINGERS IV SOLN: INTRAVENOUS | Status: DC

## 2020-10-25 SURGICAL SUPPLY — 26 items
CATH ROBINSON RED A/P 16FR (CATHETERS) ×3 IMPLANT
COVER WAND RF STERILE (DRAPES) IMPLANT
DEVICE MYOSURE LITE (MISCELLANEOUS) ×1 IMPLANT
DEVICE MYOSURE REACH (MISCELLANEOUS) IMPLANT
ELECT REM PT RETURN 9FT ADLT (ELECTROSURGICAL)
ELECTRODE REM PT RTRN 9FT ADLT (ELECTROSURGICAL) IMPLANT
GLOVE BIO SURGEON STRL SZ7 (GLOVE) ×3 IMPLANT
GLOVE INDICATOR 7.5 STRL GRN (GLOVE) ×3 IMPLANT
GOWN STRL REUS W/ TWL LRG LVL3 (GOWN DISPOSABLE) ×4 IMPLANT
GOWN STRL REUS W/TWL LRG LVL3 (GOWN DISPOSABLE) ×6
INFUSOR MANOMETER BAG 3000ML (MISCELLANEOUS) ×1 IMPLANT
IV LACTATED RINGER IRRG 3000ML (IV SOLUTION) ×3
IV LR IRRIG 3000ML ARTHROMATIC (IV SOLUTION) IMPLANT
IV NS IRRIG 3000ML ARTHROMATIC (IV SOLUTION) ×3 IMPLANT
KIT PROCEDURE FLUENT (KITS) ×3 IMPLANT
KIT TURNOVER CYSTO (KITS) ×3 IMPLANT
MANIFOLD NEPTUNE II (INSTRUMENTS) ×3 IMPLANT
MYOSURE XL FIBROID (MISCELLANEOUS)
Mirena IUD ×1 IMPLANT
PACK DNC HYST (MISCELLANEOUS) ×3 IMPLANT
PAD OB MATERNITY 4.3X12.25 (PERSONAL CARE ITEMS) ×3 IMPLANT
PAD PREP 24X41 OB/GYN DISP (PERSONAL CARE ITEMS) ×3 IMPLANT
SEAL ROD LENS SCOPE MYOSURE (ABLATOR) ×3 IMPLANT
SYSTEM TISS REMOVAL MYOSURE XL (MISCELLANEOUS) IMPLANT
TOWEL OR 17X26 4PK STRL BLUE (TOWEL DISPOSABLE) ×3 IMPLANT
TUBING CONNECTING 10 (TUBING) ×3 IMPLANT

## 2020-10-25 NOTE — Anesthesia Preprocedure Evaluation (Signed)
Anesthesia Evaluation  Patient identified by MRN, date of birth, ID band Patient awake    Reviewed: Allergy & Precautions, H&P , NPO status , Patient's Chart, lab work & pertinent test results  History of Anesthesia Complications Negative for: history of anesthetic complications  Airway Mallampati: II  TM Distance: >3 FB     Dental  (+) Teeth Intact   Pulmonary sleep apnea , neg COPD,    breath sounds clear to auscultation       Cardiovascular hypertension, (-) angina(-) Past MI and (-) Cardiac Stents (-) dysrhythmias  Rhythm:regular Rate:Normal     Neuro/Psych  Headaches, PSYCHIATRIC DISORDERS Anxiety Depression    GI/Hepatic Neg liver ROS, GERD  Controlled,  Endo/Other  Morbid obesityPre-diabetes  Renal/GU      Musculoskeletal   Abdominal   Peds  Hematology negative hematology ROS (+)   Anesthesia Other Findings Past Medical History: No date: Anemia 2014: Anxiety No date: Arthritis     Comment:  right arm, right knee No date: Chicken pox No date: Depression No date: Frequent headaches No date: GERD (gastroesophageal reflux disease) No date: Hyperlipemia No date: Hypertension No date: Obesity (BMI 30-39.9) No date: Pre-diabetes No date: Sleep apnea     Comment:  CPAP No date: Trigger finger     Comment:  MIDDLE FINGER RIGHT HAND SURGERY ON JULY 31ST 2019  Past Surgical History: 07/18/2019: COLONOSCOPY WITH PROPOFOL; N/A     Comment:  Procedure: COLONOSCOPY WITH PROPOFOL;  Surgeon: Midge Minium, MD;  Location: [facility];  Service:               Endoscopy;  Laterality: N/A;  Latex Sleep apnea 05/20/2018: HYSTEROSCOPY WITH D & C; N/A     Comment:  Procedure: DILATATION AND CURETTAGE /HYSTEROSCOPY;                Surgeon: Vena Austria, MD;  Location: [facility];                Service: Gynecology;  Laterality: N/A; 05/20/2018: INTRAUTERINE DEVICE (IUD) INSERTION; N/A      Comment:  Procedure: INTRAUTERINE DEVICE (IUD) INSERTION;                Surgeon: Vena Austria, MD;  Location: [facility];                Service: Gynecology;  Laterality: N/A; No date: WISDOM TOOTH EXTRACTION     Reproductive/Obstetrics negative OB ROS                            Anesthesia Physical Anesthesia Plan  ASA: III  Anesthesia Plan: General ETT   Post-op Pain Management:    Induction:   PONV Risk Score and Plan: Ondansetron, Dexamethasone, Midazolam and Treatment may vary due to age or medical condition  Airway Management Planned:   Additional Equipment:   Intra-op Plan:   Post-operative Plan:   Informed Consent: I have reviewed the patients History and Physical, chart, labs and discussed the procedure including the risks, benefits and alternatives for the proposed anesthesia with the patient or authorized representative who has indicated his/her understanding and acceptance.     Dental Advisory Given  Plan Discussed with: Anesthesiologist, CRNA and Surgeon  Anesthesia Plan Comments:         Anesthesia Quick Evaluation

## 2020-10-25 NOTE — Anesthesia Procedure Notes (Signed)
Procedure Name: Intubation Date/Time: 10/25/2020 1:10 PM Performed by: Lanora Manis, CRNA Pre-anesthesia Checklist: Patient identified, Patient being monitored, Timeout performed, Emergency Drugs available and Suction available Patient Re-evaluated:Patient Re-evaluated prior to induction Oxygen Delivery Method: Circle system utilized Preoxygenation: Pre-oxygenation with 100% oxygen Induction Type: IV induction Ventilation: Mask ventilation without difficulty Laryngoscope Size: 3 and McGraph Grade View: Grade I Tube type: Oral Tube size: 7.0 mm Number of attempts: 1 Airway Equipment and Method: Stylet Placement Confirmation: ETT inserted through vocal cords under direct vision,  positive ETCO2 and breath sounds checked- equal and bilateral Secured at: 21 cm Tube secured with: Tape Dental Injury: Teeth and Oropharynx as per pre-operative assessment  Comments: Anterior airway

## 2020-10-25 NOTE — OR Nursing (Signed)
IUD implant was explanted by MD. Disposed of per MD in biohazard waste.

## 2020-10-25 NOTE — Interval H&P Note (Signed)
History and Physical Interval Note:  10/25/2020 12:59 PM  Kathleen Burke  has presented today for surgery, with the diagnosis of Endometrial polyp.  The various methods of treatment have been discussed with the patient and family. After consideration of risks, benefits and other options for treatment, the patient has consented to  Procedure(s): DILATATION AND CURETTAGE /HYSTEROSCOPY (N/A) INTRAUTERINE DEVICE (IUD) INSERTION as a surgical intervention.  The patient's history has been reviewed, patient examined, no change in status, stable for surgery.  I have reviewed the patient's chart and labs.  Questions were answered to the patient's satisfaction.     Vena Austria

## 2020-10-25 NOTE — Transfer of Care (Signed)
Immediate Anesthesia Transfer of Care Note  Patient: Kathleen Burke  Procedure(s) Performed: DILATATION AND CURETTAGE /HYSTEROSCOPY (N/A ) INTRAUTERINE DEVICE (IUD) INSERTION  Patient Location: PACU  Anesthesia Type:General  Level of Consciousness: awake, alert  and oriented  Airway & Oxygen Therapy: Patient Spontanous Breathing  Post-op Assessment: Report given to RN and Post -op Vital signs reviewed and stable  Post vital signs: Reviewed and stable  Last Vitals:  Vitals Value Taken Time  BP 132/86 10/25/20 1357  Temp    Pulse 87 10/25/20 1400  Resp 17 10/25/20 1400  SpO2 95 % 10/25/20 1400  Vitals shown include unvalidated device data.  Last Pain:  Vitals:   10/25/20 1124  TempSrc: Temporal  PainSc: 0-No pain      Patients Stated Pain Goal: 0 (10/25/20 1124)  Complications: No complications documented.

## 2020-10-25 NOTE — Discharge Instructions (Signed)

## 2020-10-25 NOTE — Op Note (Signed)
Preoperative Diagnosis: 1) 44 y.o. with ultrasound showing focal endometrial abnormality  Postoperative Diagnosis: 1) 44 y.o. with endometrial polyp  Operation Performed: Hysteroscopy, and polypectomy.  Mirena IUD removal and replacement  Indication: Patient with ultrasound finding of probable endometrial polyp.  Incidental findings for follow up imaging of ovarian cyst  Anesthesia: General  Primary Surgeon: Vena Austria, MD  Assistant: none  Preoperative Antibiotics: none  Estimated Blood Loss: 40mL  IV Fluids:319mL  Urine Output:: ~101mL straight cath  Drains or Tubes: none  Implants: none  Specimens Removed: endometrial curettings  Complications: none  Intraoperative Findings:  Normal cervix, normal tubal ostia and cavity.  The posterior lower uterine segment at the junction of the internal cervical os contained a small polyp.  This was able to be resected completely using a mysure device.  Patient Condition: stable  Procedure in Detail:  Patient was taken to the operating room were she was administered general endotracheal anesthesia.  She was positioned in the dorsal lithotomy position utilizing Allen stirups, prepped and draped in the usual sterile fashion.  Uterus was noted to be non-enlarged in size, retroverted.   Prior to proceeding with the case a time out was performed.    Uterus sounded to 10 cm. IUD placed per manufacturer's recommendations.  Strings trimmed to 3 cm. The single tooth tenaculum was removed from the cervix.  The tenaculum sites and cervix were noted to be  Hemostatic before removing the operative speculum.  Sponge needle and instrument counts were corrects times two.  The patient tolerated the procedure well and was taken to the recovery room in stable condition.

## 2020-10-26 ENCOUNTER — Encounter: Payer: Self-pay | Admitting: Obstetrics and Gynecology

## 2020-10-26 NOTE — Anesthesia Postprocedure Evaluation (Signed)
Anesthesia Post Note  Patient: Kathleen Burke  Procedure(s) Performed: DILATATION AND CURETTAGE /HYSTEROSCOPY (N/A ) INTRAUTERINE DEVICE (IUD) INSERTION  Patient location during evaluation: PACU Anesthesia Type: General Level of consciousness: awake and alert Pain management: pain level controlled Vital Signs Assessment: post-procedure vital signs reviewed and stable Respiratory status: spontaneous breathing, nonlabored ventilation and respiratory function stable Cardiovascular status: blood pressure returned to baseline and stable Postop Assessment: no apparent nausea or vomiting Anesthetic complications: no   No complications documented.   Last Vitals:  Vitals:   10/25/20 1456 10/25/20 1519  BP: 132/82 (!) 147/80  Pulse: 88 96  Resp: 16 16  Temp: (!) 36.4 C   SpO2:  98%    Last Pain:  Vitals:   10/25/20 1519  TempSrc:   PainSc: 2                  Karleen Hampshire

## 2020-10-29 LAB — SURGICAL PATHOLOGY

## 2020-10-29 MED ORDER — MIDAZOLAM HCL 2 MG/2ML IJ SOLN
INTRAMUSCULAR | Status: DC | PRN
  Administered 2020-10-25: 1 mg via INTRAVENOUS

## 2020-10-29 NOTE — Addendum Note (Signed)
Addendum  created 10/29/20 1648 by Henrietta Hoover, CRNA   Intraprocedure Meds edited

## 2020-11-05 ENCOUNTER — Ambulatory Visit: Payer: BC Managed Care – PPO | Admitting: Obstetrics and Gynecology

## 2020-12-26 ENCOUNTER — Telehealth: Payer: Self-pay | Admitting: Internal Medicine

## 2020-12-26 NOTE — Telephone Encounter (Addendum)
[facility] consider HbA1c monitoring  12-24-20 faxed on 12-25-20

## 2021-02-26 ENCOUNTER — Other Ambulatory Visit: Payer: Self-pay | Admitting: Internal Medicine

## 2021-02-26 DIAGNOSIS — I10 Essential (primary) hypertension: Secondary | ICD-10-CM

## 2021-02-27 ENCOUNTER — Other Ambulatory Visit: Payer: Self-pay | Admitting: Internal Medicine

## 2021-02-27 DIAGNOSIS — I10 Essential (primary) hypertension: Secondary | ICD-10-CM

## 2021-03-13 ENCOUNTER — Other Ambulatory Visit: Payer: Self-pay

## 2021-03-13 ENCOUNTER — Other Ambulatory Visit (INDEPENDENT_AMBULATORY_CARE_PROVIDER_SITE_OTHER): Payer: BC Managed Care – PPO

## 2021-03-13 DIAGNOSIS — Z13818 Encounter for screening for other digestive system disorders: Secondary | ICD-10-CM

## 2021-03-13 DIAGNOSIS — Z1389 Encounter for screening for other disorder: Secondary | ICD-10-CM

## 2021-03-13 DIAGNOSIS — Z1329 Encounter for screening for other suspected endocrine disorder: Secondary | ICD-10-CM

## 2021-03-13 DIAGNOSIS — R7303 Prediabetes: Secondary | ICD-10-CM | POA: Diagnosis not present

## 2021-03-13 DIAGNOSIS — I10 Essential (primary) hypertension: Secondary | ICD-10-CM

## 2021-03-13 LAB — CBC WITH DIFFERENTIAL/PLATELET
Basophils Absolute: 0 10*3/uL (ref 0.0–0.1)
Basophils Relative: 0.6 % (ref 0.0–3.0)
Eosinophils Absolute: 0.1 10*3/uL (ref 0.0–0.7)
Eosinophils Relative: 1.7 % (ref 0.0–5.0)
HCT: 34.6 % — ABNORMAL LOW (ref 36.0–46.0)
Hemoglobin: 11.7 g/dL — ABNORMAL LOW (ref 12.0–15.0)
Lymphocytes Relative: 30.2 % (ref 12.0–46.0)
Lymphs Abs: 1.8 10*3/uL (ref 0.7–4.0)
MCHC: 34 g/dL (ref 30.0–36.0)
MCV: 87.1 fl (ref 78.0–100.0)
Monocytes Absolute: 0.4 10*3/uL (ref 0.1–1.0)
Monocytes Relative: 6 % (ref 3.0–12.0)
Neutro Abs: 3.6 10*3/uL (ref 1.4–7.7)
Neutrophils Relative %: 61.5 % (ref 43.0–77.0)
Platelets: 365 10*3/uL (ref 150.0–400.0)
RBC: 3.97 Mil/uL (ref 3.87–5.11)
RDW: 14 % (ref 11.5–15.5)
WBC: 5.8 10*3/uL (ref 4.0–10.5)

## 2021-03-13 LAB — COMPREHENSIVE METABOLIC PANEL
ALT: 17 U/L (ref 0–35)
AST: 16 U/L (ref 0–37)
Albumin: 4.5 g/dL (ref 3.5–5.2)
Alkaline Phosphatase: 73 U/L (ref 39–117)
BUN: 19 mg/dL (ref 6–23)
CO2: 27 mEq/L (ref 19–32)
Calcium: 9.5 mg/dL (ref 8.4–10.5)
Chloride: 97 mEq/L (ref 96–112)
Creatinine, Ser: 0.86 mg/dL (ref 0.40–1.20)
GFR: 80.71 mL/min (ref >60.00)
Glucose, Bld: 114 mg/dL — ABNORMAL HIGH (ref 70–99)
Potassium: 3.2 mEq/L — ABNORMAL LOW (ref 3.5–5.1)
Sodium: 136 mEq/L (ref 135–145)
Total Bilirubin: 0.4 mg/dL (ref 0.2–1.2)
Total Protein: 7.6 g/dL (ref 6.0–8.3)

## 2021-03-13 LAB — LIPID PANEL
Cholesterol: 194 mg/dL (ref 0–200)
HDL: 61.4 mg/dL (ref >39.00)
LDL Cholesterol: 122 mg/dL — ABNORMAL HIGH (ref 0–99)
NonHDL: 132.25
Total CHOL/HDL Ratio: 3
Triglycerides: 52 mg/dL (ref 0.0–149.0)
VLDL: 10.4 mg/dL (ref 0.0–40.0)

## 2021-03-13 LAB — HEMOGLOBIN A1C: Hgb A1c MFr Bld: 6.4 % (ref 4.6–6.5)

## 2021-03-13 LAB — TSH: TSH: 1.89 u[IU]/mL (ref 0.35–4.50)

## 2021-03-14 LAB — URINALYSIS, ROUTINE W REFLEX MICROSCOPIC
Bilirubin Urine: NEGATIVE
Glucose, UA: NEGATIVE
Hgb urine dipstick: NEGATIVE
Ketones, ur: NEGATIVE
Leukocytes,Ua: NEGATIVE
Nitrite: NEGATIVE
Protein, ur: NEGATIVE
Specific Gravity, Urine: 1.005 (ref 1.001–1.035)
pH: 5.5 (ref 5.0–8.0)

## 2021-03-14 LAB — HEPATITIS C ANTIBODY
Hepatitis C Ab: NONREACTIVE
SIGNAL TO CUT-OFF: 0 (ref <1.00)

## 2021-03-15 ENCOUNTER — Telehealth: Payer: Self-pay | Admitting: Internal Medicine

## 2021-03-15 NOTE — Telephone Encounter (Signed)
Patient informed and verbalized understanding

## 2021-03-15 NOTE — Telephone Encounter (Signed)
Patient was returning call for results 

## 2021-03-18 ENCOUNTER — Other Ambulatory Visit: Payer: Self-pay | Admitting: Internal Medicine

## 2021-03-18 DIAGNOSIS — E785 Hyperlipidemia, unspecified: Secondary | ICD-10-CM

## 2021-03-18 DIAGNOSIS — E876 Hypokalemia: Secondary | ICD-10-CM

## 2021-03-18 MED ORDER — PRAVASTATIN SODIUM 20 MG PO TABS: 20.0000 mg | ORAL_TABLET | Freq: Every day | ORAL | 3 refills | Status: DC

## 2021-03-18 MED ORDER — POTASSIUM CHLORIDE CRYS ER 20 MEQ PO TBCR: 20.0000 meq | EXTENDED_RELEASE_TABLET | Freq: Every day | ORAL | 3 refills | Status: DC

## 2021-03-19 ENCOUNTER — Other Ambulatory Visit: Payer: Self-pay

## 2021-03-19 ENCOUNTER — Emergency Department
Admission: EM | Admit: 2021-03-19 | Discharge: 2021-03-20 | Disposition: A | Discharge status: Home / Self Care | Payer: BC Managed Care – PPO | Attending: Emergency Medicine | Admitting: Emergency Medicine

## 2021-03-19 ENCOUNTER — Emergency Department: Payer: BC Managed Care – PPO

## 2021-03-19 DIAGNOSIS — I10 Essential (primary) hypertension: Secondary | ICD-10-CM | POA: Diagnosis not present

## 2021-03-19 DIAGNOSIS — Z20822 Contact with and (suspected) exposure to covid-19: Secondary | ICD-10-CM | POA: Diagnosis not present

## 2021-03-19 DIAGNOSIS — R0789 Other chest pain: Secondary | ICD-10-CM | POA: Insufficient documentation

## 2021-03-19 DIAGNOSIS — R000 Tachycardia, unspecified: Secondary | ICD-10-CM | POA: Diagnosis not present

## 2021-03-19 DIAGNOSIS — Z91040 Latex allergy status: Secondary | ICD-10-CM | POA: Diagnosis not present

## 2021-03-19 DIAGNOSIS — R7303 Prediabetes: Secondary | ICD-10-CM | POA: Insufficient documentation

## 2021-03-19 DIAGNOSIS — Z79899 Other long term (current) drug therapy: Secondary | ICD-10-CM | POA: Diagnosis not present

## 2021-03-19 DIAGNOSIS — R079 Chest pain, unspecified: Secondary | ICD-10-CM

## 2021-03-19 DIAGNOSIS — E876 Hypokalemia: Secondary | ICD-10-CM | POA: Insufficient documentation

## 2021-03-19 LAB — BASIC METABOLIC PANEL WITH GFR
Anion gap: 11 (ref 5–15)
BUN: 13 mg/dL (ref 6–20)
CO2: 27 mmol/L (ref 22–32)
Calcium: 9.2 mg/dL (ref 8.9–10.3)
Chloride: 100 mmol/L (ref 98–111)
Creatinine, Ser: 0.9 mg/dL (ref 0.44–1.00)
GFR, Estimated: >60 mL/min
Glucose, Bld: 148 mg/dL — ABNORMAL HIGH (ref 70–99)
Potassium: 3.4 mmol/L — ABNORMAL LOW (ref 3.5–5.1)
Sodium: 138 mmol/L (ref 135–145)

## 2021-03-19 LAB — CBC
HCT: 33.7 % — ABNORMAL LOW (ref 36.0–46.0)
Hemoglobin: 11.2 g/dL — ABNORMAL LOW (ref 12.0–15.0)
MCH: 29.5 pg (ref 26.0–34.0)
MCHC: 33.2 g/dL (ref 30.0–36.0)
MCV: 88.7 fL (ref 80.0–100.0)
Platelets: 322 10*3/uL (ref 150–400)
RBC: 3.8 MIL/uL — ABNORMAL LOW (ref 3.87–5.11)
RDW: 13.2 % (ref 11.5–15.5)
WBC: 8.9 10*3/uL (ref 4.0–10.5)
nRBC: 0 % (ref 0.0–0.2)

## 2021-03-19 LAB — TROPONIN I (HIGH SENSITIVITY)
Troponin I (High Sensitivity): 5 ng/L
Troponin I (High Sensitivity): 5 ng/L

## 2021-03-19 MED ORDER — SODIUM CHLORIDE 0.9 % IV BOLUS
1000.0000 mL | Freq: Once | INTRAVENOUS | Status: AC
  Administered 2021-03-20: 1000 mL via INTRAVENOUS

## 2021-03-19 MED ORDER — POTASSIUM CHLORIDE CRYS ER 20 MEQ PO TBCR
40.0000 meq | EXTENDED_RELEASE_TABLET | Freq: Once | ORAL | Status: AC
  Administered 2021-03-20: 40 meq via ORAL
  Filled 2021-03-19: qty 2

## 2021-03-19 NOTE — ED Provider Notes (Signed)
[facility] Emergency Department Provider Note  ____________________________________________   Event Date/Time   First MD Initiated Contact with Patient 03/19/21 2347     (approximate)  I have reviewed the triage vital signs and the nursing notes.   HISTORY  Chief Complaint Chest Pain    HPI Kathleen Burke is a 45 y.o. female with hypertension, hyperlipidemia who comes in with chest pain that began at 5 PM and then felt short of breath at 7 PM.  Patient got 324 of aspirin at home.  Patient states that she had just come home from work.  States that she felt fine prior to this happening.  Reports having some pressure sensation in her chest and sharp stabbing sensation underneath her left breast.  Reports she felt really short of breath with it constant, nothing made it better, nothing made it worse.  Reports that her symptoms are now much better.  She does report feeling anxious with calling EMS.  Denies any risk factors for PE.  Is COVID vaccinated denies any recent contacts or anyone else sick at home.    Past Medical History:  Diagnosis Date  . Anemia   . Anxiety 2014  . Arthritis    right arm, right knee  . Chicken pox   . Depression   . Frequent headaches   . GERD (gastroesophageal reflux disease)   . Hyperlipemia   . Hypertension   . Obesity (BMI 30-39.9)   . Pre-diabetes   . Sleep apnea    CPAP  . Trigger finger    MIDDLE FINGER RIGHT HAND SURGERY ON JULY 31ST 2019    Patient Active Problem List   Diagnosis Date Noted  . Endometrial polyp   . Blood transfusion declined because patient is Jehovah's Witness 10/18/2020  . 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 Prediabetes 03/22/2020  . Vitamin D deficiency 03/22/2020  . CTS (carpal tunnel syndrome) 12/30/2019  . Low back pain 09/28/2019  . Special screening for malignant neoplasms, colon   . Dizziness 06/16/2019  . Numbness and tingling in both hands 06/16/2019  . OSA (obstructive sleep  apnea) 06/16/2019  . Nonintractable headache 06/16/2019  . Edema 06/16/2019  . Eczema 12/01/2018  . Acute pain of right shoulder 12/01/2018  . Epidermal inclusion cyst 12/01/2018  . Right arm pain 12/01/2018  . Sinus tachycardia 12/01/2018  . HTN (hypertension) 06/22/2018  . Trigger finger, right 06/22/2018  . No blood products 05/11/2018  . Low vitamin D level 03/23/2018  . Microcytic hypochromic anemia 03/23/2018  . Anxiety and depression 01/14/2016  . Obesity (BMI 30-39.9) 01/14/2016  . Annual physical exam 01/14/2016    Past Surgical History:  Procedure Laterality Date  . COLONOSCOPY WITH PROPOFOL N/A 07/18/2019   Procedure: COLONOSCOPY WITH PROPOFOL;  Surgeon: [name], MD;  Location: [place];  Service: Endoscopy;  Laterality: N/A;  Latex Sleep apnea  . HYSTEROSCOPY WITH D & C N/A 05/20/2018   Procedure: DILATATION AND CURETTAGE /HYSTEROSCOPY;  Surgeon: [name], MD;  Location: [facility];  Service: Gynecology;  Laterality: N/A;  . HYSTEROSCOPY WITH D & C N/A 10/25/2020   Procedure: DILATATION AND CURETTAGE /HYSTEROSCOPY;  Surgeon: [name], MD;  Location: [facility];  Service: Gynecology;  Laterality: N/A;  . INTRAUTERINE DEVICE (IUD) INSERTION N/A 05/20/2018   Procedure: INTRAUTERINE DEVICE (IUD) INSERTION;  Surgeon: [name], MD;  Location: [facility];  Service: Gynecology;  Laterality: N/A;  . INTRAUTERINE DEVICE (IUD) INSERTION  10/25/2020   Procedure: INTRAUTERINE DEVICE (  IUD) INSERTION;  Surgeon: [name], MD;  Location: [facility];  Service: Gynecology;;  . WISDOM TOOTH EXTRACTION      Prior to Admission medications   Medication Sig Start Date End Date Taking? Authorizing Provider  ACCU-CHEK GUIDE test strip Qd E 11.9 accucheck test strips 03/22/20   [name], [name], MD  b complex vitamins capsule Take 1 capsule by mouth daily.    [provider]  blood glucose meter kit and supplies Dispense based on patient  and insurance preference. Use up to four times daily as directed. (FOR ICD-10 E10.9, E11.9). 05/03/19   [name], [name], MD  Cholecalciferol (VITAMIN D3) 125 MCG (5000 UT) CAPS Take 5,000 Units by mouth daily.    [provider]  Cyanocobalamin (B-12) 1000 MCG TABS Take 1,000 mcg by mouth daily. 08/16/19   [provider]  diclofenac sodium (VOLTAREN) 1 % GEL Apply 2 g topically 4 (four) times daily. Patient taking differently: Apply 2 g topically 4 (four) times daily as needed (pain). 12/02/18   [name], [name], MD  docusate sodium (COLACE) 100 MG capsule Take 100 mg by mouth daily.     [provider]  ELDERBERRY PO Take 1 each by mouth 2 (two) times daily. Gummies    [provider]  ferrous sulfate 325 (65 FE) MG tablet Take 325 mg by mouth daily with breakfast.     [provider]  hydrochlorothiazide (HYDRODIURIL) 25 MG tablet TAKE 1 TABLET BY MOUTH EVERY DAY 02/26/21   [name], [name], MD  HYDROcodone-acetaminophen (NORCO/VICODIN) 5-325 MG tablet Take 1 tablet by mouth every 6 (six) hours as needed. 10/25/20   [name], MD  hydrocortisone 2.5 % cream Apply topically 2 (two) times daily. As needed neck, face Patient taking differently: Apply 1 application topically 2 (two) times daily as needed (face and neck). 08/03/20   [name], [name], MD  Lancets (ACCU-CHEK SOFT TOUCH) lancets Qd Use as instructed E11.9 03/22/20   [name], [name], MD  methocarbamol (ROBAXIN) 500 MG tablet Take 500 mg by mouth every 6 (six) hours as needed for muscle spasms.    [provider]  metoprolol succinate (TOPROL-XL) 25 MG 24 hr tablet Take 1 tablet (25 mg total) by mouth daily. 10/05/20   [name], [name], MD  naproxen sodium (ALEVE) 220 MG tablet Take 220 mg by mouth daily as needed (pain).    [provider]  olmesartan (BENICAR) 20 MG tablet TAKE 1 TABLET BY MOUTH EVERY DAY 02/27/21    [name], [name], MD  potassium chloride SA (KLOR-CON) 20 MEQ tablet Take 1 tablet (20 mEq total) by mouth daily. 03/18/21   [name], [name], MD  pravastatin (PRAVACHOL) 20 MG tablet Take 1 tablet (20 mg total) by mouth daily. After 6 pm 03/18/21   [name], [name], MD  triamcinolone cream (KENALOG) 0.1 % Apply 1 application topically 2 (two) times daily. Patient taking differently: Apply 1 application topically 2 (two) times daily as needed (irritation). 12/01/18   [name], [name], MD  TURMERIC PO Take 500 mg by mouth daily. With ginger     [provider]    Allergies Latex  Family History  Problem Relation Age of Onset  . Arthritis Mother   . Hyperlipidemia Mother   . Hypertension Mother   . Diabetes Mellitus I Father        DM I  . Breast cancer Maternal Grandmother 33  . Arthritis Paternal Grandmother   . Diabetes  Mellitus II Paternal Grandmother   . Arthritis Paternal Grandfather   . Hyperlipidemia Paternal Grandfather   . Stroke Paternal Grandfather   . Hypertension Paternal Grandfather   . Diabetes Mellitus II Sister   . Diabetes Mellitus II Sister   . Hyperlipidemia Sister   . Hypertension Sister     Social History Social History   Tobacco Use  . Smoking status: Never Smoker  . Smokeless tobacco: Never Used  Vaping Use  . Vaping Use: Never used  Substance Use Topics  . Alcohol use: Yes    Alcohol/week: 2.0 standard drinks    Types: 2 Glasses of wine per week    Comment: OCC-WEEKENDS  . Drug use: No      Review of Systems Constitutional: No fever/chills Eyes: No visual changes. ENT: No sore throat. Cardiovascular: Positive chest pain Respiratory: Positive shortness of breath Gastrointestinal: No abdominal pain.  No nausea, no vomiting.  No diarrhea.  No constipation. Genitourinary: Negative for dysuria. Musculoskeletal: Negative for back pain. Skin: Negative for rash. Neurological: Negative for headaches, focal  weakness or numbness. All other ROS negative ____________________________________________   PHYSICAL EXAM:  VITAL SIGNS: ED Triage Vitals  Enc Vitals Group     BP 03/19/21 2029 (!) 153/95     Pulse Rate 03/19/21 2029 (!) 110     Resp 03/19/21 2029 18     Temp 03/19/21 2029 99.2 F (37.3 C)     Temp Source 03/19/21 2029 Oral     SpO2 03/19/21 2029 98 %     Weight 03/19/21 2029 230 lb (104.3 kg)     Height 03/19/21 2029 '5\' 6"'  (1.676 m)     Head Circumference --      Peak Flow --      Pain Score 03/19/21 2036 0     Pain Loc --      Pain Edu? --      Excl. in [place]? --     Constitutional: Alert and oriented. Well appearing and in no acute distress. Eyes: Conjunctivae are normal. EOMI. Head: Atraumatic. Nose: No congestion/rhinnorhea. Mouth/Throat: Mucous membranes are moist.   Neck: No stridor. Trachea Midline. FROM Cardiovascular: Tachycardic, regular rhythm. Grossly normal heart sounds.  Good peripheral circulation. Respiratory: Normal respiratory effort.  No retractions. Lungs CTAB. Gastrointestinal: Soft and nontender. No distention. No abdominal bruits.  Musculoskeletal: No lower extremity tenderness nor edema.  No joint effusions. Neurologic:  Normal speech and language. No gross focal neurologic deficits are appreciated.  Skin:  Skin is warm, dry and intact. No rash noted. Psychiatric: Mood and affect are normal. Speech and behavior are normal. GU: Deferred   ____________________________________________   LABS (all labs ordered are listed, but only abnormal results are displayed)  Labs Reviewed  BASIC METABOLIC PANEL - Abnormal; Notable for the following components:      Result Value   Potassium 3.4 (*)    Glucose, Bld 148 (*)    All other components within normal limits  CBC - Abnormal; Notable for the following components:   RBC 3.80 (*)    Hemoglobin 11.2 (*)    HCT 33.7 (*)    All other components within normal limits  RESP PANEL BY RT-PCR (FLU A&B,  COVID) ARPGX2  D-DIMER, QUANTITATIVE  POC URINE PREG, ED  TROPONIN I (HIGH SENSITIVITY)  TROPONIN I (HIGH SENSITIVITY)   ____________________________________________   ED ECG REPORT I, Vanessa Esto, the attending physician, personally viewed and interpreted this ECG.  Sinus tachycardia rate of 113, no  ST elevation, no T wave inversions, normal intervals ____________________________________________  RADIOLOGY Robert Bellow, personally viewed and evaluated these images (plain radiographs) as part of my medical decision making, as well as reviewing the written report by the radiologist.  ED MD interpretation: No pneumonia  Official radiology report(s): DG Chest 2 View  Result Date: 03/19/2021 CLINICAL DATA:  Chest pain EXAM: CHEST - 2 VIEW COMPARISON:  10/31/2019 FINDINGS: The heart size and mediastinal contours are within normal limits. Both lungs are clear. The visualized skeletal structures are unremarkable. IMPRESSION: No active cardiopulmonary disease. Electronically Signed   By: Fidela Salisbury MD   On: 03/19/2021 21:10    ____________________________________________   PROCEDURES  Procedure(s) performed (including Critical Care):  .1-3 Lead EKG Interpretation Performed by: Vanessa Partridge, MD Authorized by: Vanessa Almont, MD     Interpretation: abnormal     ECG rate:  105s   ECG rate assessment: tachycardic     Rhythm: sinus tachycardia     Ectopy: none     Conduction: normal   Comments:     Patient initially normal sinus rate of 98 when I walked into the room and went up to low 100s     ____________________________________________   INITIAL IMPRESSION / ASSESSMENT AND PLAN / ED COURSE   Kathleen Burke was evaluated in Emergency Department on 03/19/2021 for the symptoms described in the history of present illness. She was evaluated in the context of the global COVID-19 pandemic, which necessitated consideration that the patient might be at risk for infection  with the SARS-CoV-2 virus that causes COVID-19. Institutional protocols and algorithms that pertain to the evaluation of patients at risk for COVID-19 are in a state of rapid change based on information released by regulatory bodies including the CDC and federal and state organizations. These policies and algorithms were followed during the patient's care in the ED.    Most Likely DDx:  -MSK (atypical chest pain) but will get cardiac markers to evaluate for ACS given risk factors/age -Given patient's tachycardia could be some anxiety but given the shortness of breath will get D-dimer.  Patient is low risk based on [place]. -We will get COVID and flu swab    DDx that was also considered d/t potential to cause harm, but was found less likely based on history and physical (as detailed above): -PNA (no fevers, cough but CXR to evaluate) -PNX (reassured with equal b/l breath sounds, CXR to evaluate) -Symptomatic anemia (will get H&H) -Pulmonary embolism as no sob at rest, not pleuritic in nature, no hypoxia -Aortic Dissection as no tearing pain and no radiation to the mid back, pulses equal -Pericarditis no rub on exam, EKG changes or hx to suggest dx -Tamponade (no notable SOB, tachycardic, hypotensive) -Esophageal rupture (no h/o diffuse vomitting/no crepitus)  Labs are reassuring, cardiac markers negative x2.  D-dimer is negative.  COVID is negative.  Potassium was low but we do give some oral repletion.  X-ray without evidence of pneumonia.  On repeat evaluation patient's heart rates have come down patient denies any symptoms.  Discussed with patient follow-up with her cardiologist given her risk factors.  Patient expressed understanding at this time is no chest pain and feels comfortable with discharge  I discussed the provisional nature of ED diagnosis, the treatment so far, the ongoing plan of care, follow up appointments and return precautions with the patient and any family or support people  present. They expressed understanding and agreed with the plan, discharged  home.         ____________________________________________   FINAL CLINICAL IMPRESSION(S) / ED DIAGNOSES   Final diagnoses:  Chest pain, unspecified type  Hypokalemia     MEDICATIONS GIVEN DURING THIS VISIT:  Medications  sodium chloride 0.9 % bolus 1,000 mL (1,000 mLs Intravenous New Bag/Given 03/20/21 0055)  potassium chloride SA (KLOR-CON) CR tablet 40 mEq (40 mEq Oral Given 03/20/21 0054)     ED Discharge Orders    None       Note:  This document was prepared using Dragon voice recognition software and may include unintentional dictation errors.   Vanessa [place], MD 03/20/21 0157

## 2021-03-19 NOTE — ED Triage Notes (Signed)
Patient to ED via [facility] for chest pain that began at 5 pm, then shortness of breath at 7pm.   Patient states heavy feeling and shortness of breath reminder her of having her stress test recently.  EMS interventions: bp 192/96, hr 120, pulse ox 98% on room air, receiving IVF via 20g angiocath to left antecub.  Patient took 324 mg of asa at home.

## 2021-03-20 LAB — RESP PANEL BY RT-PCR (FLU A&B, COVID) ARPGX2
Influenza A by PCR: NEGATIVE
Influenza B by PCR: NEGATIVE
SARS Coronavirus 2 by RT PCR: NEGATIVE

## 2021-03-20 LAB — D-DIMER, QUANTITATIVE: D-Dimer, Quant: 0.3 ug/mL-FEU (ref 0.00–0.50)

## 2021-03-20 NOTE — Discharge Instructions (Addendum)
Your work-up was reassuring there is no signs of a heart attack or blood clot and a COVID test was negative.  However if you develop worsening symptoms please return to the ER immediately otherwise you should call your cardiologist to get follow-up at next available appointment.

## 2021-03-26 DIAGNOSIS — N840 Polyp of corpus uteri: Secondary | ICD-10-CM

## 2021-03-27 DIAGNOSIS — Z87898 Personal history of other specified conditions: Secondary | ICD-10-CM

## 2021-03-27 HISTORY — DX: Personal history of other specified conditions: Z87.898

## 2021-03-29 ENCOUNTER — Ambulatory Visit (INDEPENDENT_AMBULATORY_CARE_PROVIDER_SITE_OTHER): Payer: BC Managed Care – PPO

## 2021-03-29 ENCOUNTER — Other Ambulatory Visit: Payer: Self-pay

## 2021-03-29 ENCOUNTER — Ambulatory Visit: Payer: BC Managed Care – PPO | Admitting: Physician Assistant

## 2021-03-29 ENCOUNTER — Encounter: Payer: Self-pay | Admitting: Physician Assistant

## 2021-03-29 ENCOUNTER — Other Ambulatory Visit: Payer: Self-pay | Admitting: Internal Medicine

## 2021-03-29 VITALS — BP 126/74 | HR 88 | Ht 66.0 in | Wt 241.0 lb

## 2021-03-29 DIAGNOSIS — E876 Hypokalemia: Secondary | ICD-10-CM

## 2021-03-29 DIAGNOSIS — E785 Hyperlipidemia, unspecified: Secondary | ICD-10-CM

## 2021-03-29 DIAGNOSIS — Z8639 Personal history of other endocrine, nutritional and metabolic disease: Secondary | ICD-10-CM

## 2021-03-29 DIAGNOSIS — E669 Obesity, unspecified: Secondary | ICD-10-CM

## 2021-03-29 DIAGNOSIS — R002 Palpitations: Secondary | ICD-10-CM

## 2021-03-29 DIAGNOSIS — D649 Anemia, unspecified: Secondary | ICD-10-CM

## 2021-03-29 DIAGNOSIS — E119 Type 2 diabetes mellitus without complications: Secondary | ICD-10-CM

## 2021-03-29 DIAGNOSIS — R072 Precordial pain: Secondary | ICD-10-CM | POA: Diagnosis not present

## 2021-03-29 DIAGNOSIS — R7303 Prediabetes: Secondary | ICD-10-CM

## 2021-03-29 DIAGNOSIS — N939 Abnormal uterine and vaginal bleeding, unspecified: Secondary | ICD-10-CM

## 2021-03-29 DIAGNOSIS — R011 Cardiac murmur, unspecified: Secondary | ICD-10-CM

## 2021-03-29 DIAGNOSIS — R079 Chest pain, unspecified: Secondary | ICD-10-CM

## 2021-03-29 DIAGNOSIS — I10 Essential (primary) hypertension: Secondary | ICD-10-CM

## 2021-03-29 NOTE — Progress Notes (Signed)
Office Visit    Patient Name: Kathleen Burke Date of Encounter: 03/29/2021  PCP:  Kathleen Burke, Kathleen Glow, MD   Kathleen Burke  Cardiologist:  Kathleen Sable, MD  Advanced Practice Provider:  No care team member to display Electrophysiologist:  None :[id number]}   Chief Complaint    Chief Complaint  Patient presents with  . Other    ED follow up - Chest pain. Patient c.o SOB and Urinating more throughout the night. Meds reviewed verbally with patient.     45 y.o. female with history of hypertension, prediabetes, BMI 38.0-39.0, and who presents today for follow-up of recent emergency department visit for chest pain with report of shortness of breath and increased urination.  Past Medical History    Past Medical History:  Diagnosis Date  . Anemia   . Anxiety 2014  . Arthritis    right arm, right knee  . Chicken pox   . Depression   . Frequent headaches   . GERD (gastroesophageal reflux disease)   . Hyperlipemia   . Hypertension   . Obesity (BMI 30-39.9)   . Pre-diabetes   . Sleep apnea    CPAP  . Trigger finger    MIDDLE FINGER RIGHT HAND SURGERY ON JULY 31ST 2019   Past Surgical History:  Procedure Laterality Date  . COLONOSCOPY WITH PROPOFOL N/A 07/18/2019   Procedure: COLONOSCOPY WITH PROPOFOL;  Surgeon: [name], MD;  Location: [place];  Service: Endoscopy;  Laterality: N/A;  Latex Sleep apnea  . HYSTEROSCOPY WITH D & C N/A 05/20/2018   Procedure: DILATATION AND CURETTAGE /HYSTEROSCOPY;  Surgeon: [name], MD;  Location: [facility];  Service: Gynecology;  Laterality: N/A;  . HYSTEROSCOPY WITH D & C N/A 10/25/2020   Procedure: DILATATION AND CURETTAGE /HYSTEROSCOPY;  Surgeon: [name], MD;  Location: [facility];  Service: Gynecology;  Laterality: N/A;  . INTRAUTERINE DEVICE (IUD) INSERTION N/A 05/20/2018   Procedure: INTRAUTERINE DEVICE (IUD) INSERTION;  Surgeon: [name], MD;  Location: [facility];   Service: Gynecology;  Laterality: N/A;  . INTRAUTERINE DEVICE (IUD) INSERTION  10/25/2020   Procedure: INTRAUTERINE DEVICE (IUD) INSERTION;  Surgeon: [name], MD;  Location: [facility];  Service: Gynecology;;  . WISDOM TOOTH EXTRACTION      Allergies  Allergies  Allergen Reactions  . Latex Itching    GLOVES (extended exposure)    History of Present Illness    Kathleen Burke is a 45 y.o. female with PMH as above.  She has history of hypertension, prediabetes, and obesity.  She was seen in office 10/2019 with report of shortness of breath and chest pain with exertion.  Echo was ordered and showed EF 60 to 65%, NR WMA, G1 DD.  Coronary CTA was ordered but difficult to obtain due to elevated heart rates.  MPI was therefore ordered in place and overall ruled a low risk scan.  CT attenuation corrected images are without aortic atherosclerosis and no significant coronary artery calcification.  She was last seen in office 02/06/2020 to review the results.  She reportedly still described anginal-like chest discomfort but was reassured by the results.  Her current blood pressure medications were continued and weight loss advised.  On 03/19/2021, she presented to the emergency department with chest pain that began at 5 PM that evening.  She reported this chest pain is waxing and waning while driving.  She then felt short of breath at around 7 PM.  She took 324 of  ASA at home.  Chest pain was described as a pressure and sharp stabbing underneath her left breast with associated shortness of breath.  Initial vitals significant for BP 153/95 and HR 110 bpm.  Temperature 99.2 F.  Labs showed hypokalemia with potassium 3.4, hyperglycemia with glucose 148, and anemia with RBC 3.8, hemoglobin 11.2, and hematocrit 33.7.  EKG was significant for sinus tachycardia without significant ST/T changes.  Chest x-ray was without active disease.  Her chest pain was thought most likely MSK in etiology.    Today,  03/29/2021, she returns to clinic and recalls the above episode.  She again reports the episode of chest discomfort that started around 5 PM that evening and continued throughout the night.  She reports today that the pressure did not seem alleviated by any factors, though it also seem to wax and wane.  Today, she describes dizziness that "comes out of the blue.  She reports tachypalpitations.  She reports that she is not sleeping well and feels often that her heart is racing.  At times, she feels as if she cannot breathe.  She reports her chest hurts when laying down flat sometimes.  At times, she notes her pulse elevated to 105 bpm with ambulation.  She is drinking 2-3 bottles of water per day.  She has noted increased urination with recommendation she follow-up with her PCP to ensure her A1c/glucose is well controlled.  She is trying to watch her salt but does note using Adobo frequently, which we discussed also has salt in it.  She does report some indigestion-like symptoms but no emesis or diarrhea.  We discussed her hypokalemia in the emergency department with patient report that her PCP has been repleting her potassium after the potassium repletion provided by the emergency department.  Of note, she also reports episodes of vaginal bleeding/clots that are not necessarily associated with her menstrual cycle with recommendation to bring this to the attention of her OB/GYN and PCP.  No other signs of bleeding reported.  Home Medications   Current Outpatient Medications  Medication Instructions  . Accu-Chek FastClix Lancets MISC USE AS DIRECTED ONCE A DAY  . ACCU-CHEK GUIDE test strip USE AS DIRECTED TO CHECK SUGAR ONCE A DAY  . b complex vitamins capsule 1 capsule, Oral, Daily  . B-12 1,000 mcg, Oral, Daily  . blood glucose meter kit and supplies Dispense based on patient and insurance preference. Use up to four times daily as directed. (FOR ICD-10 E10.9, E11.9).  Marland Kitchen diclofenac sodium (VOLTAREN) 2 g,  Topical, 4 times daily  . docusate sodium (COLACE) 100 mg, Oral, Daily  . ELDERBERRY PO 1 each, Oral, 2 times daily, Gummies  . ferrous sulfate 325 mg, Oral, Daily with breakfast  . hydrochlorothiazide (HYDRODIURIL) 25 MG tablet TAKE 1 TABLET BY MOUTH EVERY DAY  . HYDROcodone-acetaminophen (NORCO/VICODIN) 5-325 MG tablet 1 tablet, Oral, Every 6 hours PRN  . hydrocortisone 2.5 % cream Topical, 2 times daily, As needed neck, face  . methocarbamol (ROBAXIN) 500 mg, Oral, Every 6 hours PRN  . metoprolol succinate (TOPROL-XL) 25 mg, Oral, Daily  . naproxen sodium (ALEVE) 220 mg, Oral, Daily PRN  . olmesartan (BENICAR) 20 MG tablet TAKE 1 TABLET BY MOUTH EVERY DAY  . potassium chloride SA (KLOR-CON) 20 MEQ tablet 20 mEq, Oral, Daily  . pravastatin (PRAVACHOL) 20 mg, Oral, Daily, After 6 pm  . triamcinolone cream (KENALOG) 0.1 % 1 application, Topical, 2 times daily  . TURMERIC PO 500 mg, Oral, Daily, With  ginger  . Vitamin D3 5,000 Units, Oral, Daily     Review of Systems    She denies pnd, orthopnea, emesis, diarrhea, syncope, edema, weight gain, or early satiety.  She reports episodes of chest pain as described above, as well as feelings of shortness of breath.  She reports tachypalpitations and dizziness/presyncope.  She reports that she is not sleeping well.  She reports episodes of vaginal bleeding/uterine clotting that is not necessarily associated with her menstrual cycle and will discuss with PCP/OB/GYN.  All other systems reviewed and are otherwise negative except as noted above.  Physical Exam    VS:  BP 126/74 (BP Location: Left Arm, Patient Position: Sitting, Cuff Size: Normal)   Pulse 88   Ht _0  (1.676 m)   Wt 241 lb (109.3 kg)   SpO2 98%   BMI 38.90 kg/m  , BMI Body mass index is 38.9 kg/m. GEN: Well nourished, well developed, in no acute distress.  Facemask in place. HEENT: normal. Neck: Supple, no JVD, carotid bruits, or masses. Cardiac: RRR, 2/6 systolic murmur. No  rubs, or gallops. No clubbing, cyanosis, edema.  Radials/DP/PT 2+ and equal bilaterally.  Respiratory:  Respirations regular and unlabored, clear to auscultation bilaterally. GI: Soft, nontender, nondistended, BS + x 4. MS: no deformity or atrophy. Skin: warm and dry, no rash. Neuro:  Strength and sensation are intact. Psych: Normal affect.  Accessory Clinical Findings    ECG personally reviewed by me today -NSR, 88 bpm, QTC 454- no acute changes.  VITALS Reviewed today   Temp Readings from Last 3 Encounters:  03/19/21 98.6 F (37 C) (Oral)  10/25/20 (!) 97.5 F (36.4 C) (Oral)  05/08/20 98.2 F (36.8 C) (Oral)   BP Readings from Last 3 Encounters:  03/29/21 126/74  03/20/21 136/75  10/25/20 (!) 147/80   Pulse Readings from Last 3 Encounters:  03/29/21 88  03/20/21 87  10/25/20 96    Wt Readings from Last 3 Encounters:  03/29/21 241 lb (109.3 kg)  03/19/21 230 lb (104.3 kg)  10/25/20 240 lb (108.9 kg)     LABS  reviewed today   Lab Results  Component Value Date   WBC 8.9 03/19/2021   HGB 11.2 (L) 03/19/2021   HCT 33.7 (L) 03/19/2021   MCV 88.7 03/19/2021   PLT 322 03/19/2021   Lab Results  Component Value Date   CREATININE 0.90 03/19/2021   BUN 13 03/19/2021   NA 138 03/19/2021   K 3.4 (L) 03/19/2021   CL 100 03/19/2021   CO2 27 03/19/2021   Lab Results  Component Value Date   ALT 17 03/13/2021   AST 16 03/13/2021   ALKPHOS 73 03/13/2021   BILITOT 0.4 03/13/2021   Lab Results  Component Value Date   CHOL 194 03/13/2021   HDL 61.40 03/13/2021   LDLCALC 122 (H) 03/13/2021   TRIG 52.0 03/13/2021   CHOLHDL 3 03/13/2021    Lab Results  Component Value Date   HGBA1C 6.4 03/13/2021   Lab Results  Component Value Date   TSH 1.89 03/13/2021     STUDIES/PROCEDURES reviewed today   Echo 12/02/19 1. Left ventricular ejection fraction, by visual estimation, is 60 to  65%. The left ventricle has normal function. There is no left ventricular   hypertrophy.  2. Left ventricular diastolic parameters are consistent with Grade I  diastolic dysfunction (impaired relaxation).  3. The left ventricle has no regional wall motion abnormalities.  4. Global right ventricle has normal systolic  function.The right  ventricular size is normal. No increase in right ventricular wall  thickness.  5. Left atrial size was normal.  6. TR signal is inadequate for assessing pulmonary artery systolic  pressure.   MPI 02/03/20 Pharmacological myocardial perfusion imaging study with no significant  ischemia Normal wall motion, EF estimated at 58% No EKG changes concerning for ischemia at peak stress or in recovery. CT attenuation correction images with no aortic atherosclerosis, no significant coronary calcification Low risk scan   Assessment & Plan    Precordial chest pain, ongoing -- Reports ongoing episodes of precordial chest discomfort as described in the past and dating back to 2021 on review of EMR.  Chest pain has both typical and atypical features.  Previous echo as above with EF normal and NR WMA, G1 DD.  MPI performed and without evidence of ischemia and overall ruled a low risk scan.  She presented to the emergency department after a recent episode of chest discomfort that was concerning to her, given its duration and severity.  On exam today, euvolemic and well compensated though new systolic murmur appreciated with recommendation to repeat an echocardiogram.  We will repeat an echo, which will allow [place] to rule out valvular disease as contributing to her symptoms.  In addition, echo will allow for reassessment of EF and wall motion, as well as assessment of heart pressures and structure.  Most recent labs reviewed with recommendation for BMET/magnesium to ensure potassium WNL.  Her PCP has started her on potassium repletion per patient report today.  Due to report of racing heart rate, she will undergo Zio XT x2 weeks.  Discussed that anemia  can sometimes also influence chest discomfort with patient report of periodic clotting not necessarily connected with her menstrual cycle and recommendation that she follow-up with her PCP/OB/GYN regarding the symptoms; however, in the meantime, we will recheck a CBC to ensure no acute drops in H&H.  Consider that GERD and MSK etiology may also be playing a role, as well as anxiety. This was discussed with pt understanding. Will update echo and obtain cardiac monitoring and labs. Continue current medications.  Tachypalpitations -- Reports racing heart rate, often interfering with sleep.  EKG today shows NSR at 88 bpm. She reports elevated rates at times with ED resting heart rate in the low 100s.  Reviewed recommendations for salt/fluid, as volume status could certainly contribute to the symptoms.  Most recent TSH WNL.  Given her recent history of hypokalemia, we will recheck a BMET/magnesium to ensure electrolytes at goal.  Agreeable to Zio XT x2 weeks for further evaluation and to rule out arrhythmia or high burden of ectopy.  Obtaining repeat echo as above.  Continue current Toprol 25 mg daily.    New systolic murmur -- Systolic murmur appreciated on exam today.  Agreeable to echo to reassess valvular function.  Recommend heart rate, BP, and volume control.  Total daily fluid and salt intake reviewed.  Will obtain CBC to rule out significant anemia as contributing to her murmur today.  Essential hypertension, goal BP 130/80 or lower -- BP today well controlled.  Continue current medications.  Continue current Toprol 25 mg daily and olmesartan 20 mg daily.  Recommended against chronic NSAID use as discussed today.  Also discussed was limiting total daily fluids to maximum 2 L/day and salt under 2 g/day.  She will work towards cutting back on [place] and monitor her salt more closely.  HLD, LDL goal below 100 -- Continue current  pravastatin.  03/13/2021 LDL 122.  Recheck of lipid and liver function per  PCP.  BMI 38.0-39.0 -- Reviewed recommendations for weight loss through diet and exercise/heart healthy diet.  Vaginal bleeding/clotting -- Reports vaginal bleeding/uterine clots that are not necessarily connected to her menstrual cycle with recommendation to follow-up with her PCP/OB/GYN.  Will obtain CBC to rule out significant anemia is contributing to her symptoms and systolic murmur on exam.  Further recommendations if needed after labs.  Medication changes: None Labs ordered: BMET, magnesium, CBC Studies / Imaging ordered: Zio XT x2 weeks, echo Future considerations: Pending Zio and echo Disposition: RTC 1 month  *Please be aware that the above documentation was completed voice recognition software and may contain dictation errors.    Arvil Chaco, PA-C 03/29/2021

## 2021-03-29 NOTE — Patient Instructions (Signed)
Medication Instructions:  Your physician recommends that you continue on your current medications as directed. Please refer to the Current Medication list given to you today.  *If you need a refill on your cardiac medications before your next appointment, please call your pharmacy*   Lab Work: Bmp, Cnc, Mag today If you have labs (blood work) drawn today and your tests are completely normal, you will receive your results only by: Marland Kitchen MyChart Message (if you have MyChart) OR . A paper copy in the mail If you have any lab test that is abnormal or we need to change your treatment, we will call you to review the results.   Testing/Procedures: Your physician has requested that you have an echocardiogram. Echocardiography is a painless test that uses sound waves to create images of your heart. It provides your doctor with information about the size and shape of your heart and how well your heart's chambers and valves are working. This procedure takes approximately one hour. There are no restrictions for this procedure.  Your physician has recommended that you wear a Zio monitor XT (To be worn for 14 days).   This monitor is a medical device that records the heart's electrical activity. Doctors most often use these monitors to diagnose arrhythmias. Arrhythmias are problems with the speed or rhythm of the heartbeat. The monitor is a small device applied to your chest. You can wear one while you do your normal daily activities. While wearing this monitor if you have any symptoms to push the button and record what you felt. Once you have worn this monitor for the period of time provider prescribed (Usually 14 days), you will return the monitor device in the postage paid box. Once it is returned they will download the data collected and provide [place] with a report which the provider will then review and we will call you with those results. Important tips:  1. Avoid showering during the first 24 hours of wearing  the monitor. 2. Avoid excessive sweating to help maximize wear time. 3. Do not submerge the device, no hot tubs, and no swimming pools. 4. Keep any lotions or oils away from the patch. 5. After 24 hours you may shower with the patch on. Take brief showers with your back facing the shower head.  6. Do not remove patch once it has been placed because that will interrupt data and decrease adhesive wear time. 7. Push the button when you have any symptoms and write down what you were feeling. 8. Once you have completed wearing your monitor, remove and place into box which has postage paid and place in your outgoing mailbox.  9. If for some reason you have misplaced your box then call our office and we can provide another box and/or mail it off for you.      Follow-Up: At [facility], you and your health needs are our priority.  As part of our continuing mission to provide you with exceptional heart care, we have created designated Provider Care Teams.  These Care Teams include your primary Cardiologist (physician) and Advanced Practice Providers (APPs -  Physician Assistants and Nurse Practitioners) who all work together to provide you with the care you need, when you need it.  We recommend signing up for the patient portal called "MyChart".  Sign up information is provided on this After Visit Summary.  MyChart is used to connect with patients for Virtual Visits (Telemedicine).  Patients are able to view lab/test results, encounter notes, upcoming  appointments, etc.  Non-urgent messages can be sent to your provider as well.   To learn more about what you can do with MyChart, go to [email or web address].    Your next appointment:   4 week(s)  The format for your next appointment:   In Person  Provider:   You may see Debbe Odea, MD or one of the following Advanced Practice Providers on your designated Care Team:    Nicolasa Ducking, NP  Eula Listen, PA-C  Marisue Ivan,  PA-C  Cadence Grandy, [place]  Gillian Shields, NP    Other Instructions N/A

## 2021-03-30 LAB — BASIC METABOLIC PANEL
BUN/Creatinine Ratio: 16 (ref 9–23)
BUN: 13 mg/dL (ref 6–24)
CO2: 19 mmol/L — ABNORMAL LOW (ref 20–29)
Calcium: 9.6 mg/dL (ref 8.7–10.2)
Chloride: 100 mmol/L (ref 96–106)
Creatinine, Ser: 0.8 mg/dL (ref 0.57–1.00)
Glucose: 107 mg/dL — ABNORMAL HIGH (ref 65–99)
Potassium: 4 mmol/L (ref 3.5–5.2)
Sodium: 138 mmol/L (ref 134–144)
eGFR: 92 mL/min/{1.73_m2} (ref >59)

## 2021-03-30 LAB — CBC WITH DIFFERENTIAL/PLATELET
Basophils Absolute: 0 10*3/uL (ref 0.0–0.2)
Basos: 0 %
EOS (ABSOLUTE): 0.1 10*3/uL (ref 0.0–0.4)
Eos: 1 %
Hematocrit: 35.4 % (ref 34.0–46.6)
Hemoglobin: 11.9 g/dL (ref 11.1–15.9)
Immature Grans (Abs): 0 10*3/uL (ref 0.0–0.1)
Immature Granulocytes: 0 %
Lymphocytes Absolute: 1.4 10*3/uL (ref 0.7–3.1)
Lymphs: 20 %
MCH: 28.7 pg (ref 26.6–33.0)
MCHC: 33.6 g/dL (ref 31.5–35.7)
MCV: 86 fL (ref 79–97)
Monocytes Absolute: 0.4 10*3/uL (ref 0.1–0.9)
Monocytes: 7 %
Neutrophils Absolute: 4.9 10*3/uL (ref 1.4–7.0)
Neutrophils: 72 %
Platelets: 374 10*3/uL (ref 150–450)
RBC: 4.14 x10E6/uL (ref 3.77–5.28)
RDW: 13 % (ref 11.7–15.4)
WBC: 6.8 10*3/uL (ref 3.4–10.8)

## 2021-03-30 LAB — MAGNESIUM: Magnesium: 2.1 mg/dL (ref 1.6–2.3)

## 2021-04-05 ENCOUNTER — Other Ambulatory Visit: Payer: Self-pay

## 2021-04-05 ENCOUNTER — Encounter: Payer: Self-pay | Admitting: Internal Medicine

## 2021-04-05 ENCOUNTER — Ambulatory Visit: Payer: BC Managed Care – PPO | Admitting: Internal Medicine

## 2021-04-05 VITALS — BP 132/88 | HR 100 | Temp 97.8°F | Ht 65.98 in | Wt 238.4 lb

## 2021-04-05 DIAGNOSIS — I10 Essential (primary) hypertension: Secondary | ICD-10-CM | POA: Diagnosis not present

## 2021-04-05 DIAGNOSIS — K219 Gastro-esophageal reflux disease without esophagitis: Secondary | ICD-10-CM | POA: Insufficient documentation

## 2021-04-05 DIAGNOSIS — Z1231 Encounter for screening mammogram for malignant neoplasm of breast: Secondary | ICD-10-CM

## 2021-04-05 DIAGNOSIS — R195 Other fecal abnormalities: Secondary | ICD-10-CM

## 2021-04-05 DIAGNOSIS — Z0000 Encounter for general adult medical examination without abnormal findings: Secondary | ICD-10-CM

## 2021-04-05 DIAGNOSIS — R0789 Other chest pain: Secondary | ICD-10-CM

## 2021-04-05 DIAGNOSIS — R7303 Prediabetes: Secondary | ICD-10-CM

## 2021-04-05 MED ORDER — TRIAMTERENE-HCTZ 37.5-25 MG PO TABS: 1.0000 | ORAL_TABLET | Freq: Every day | ORAL | 3 refills | Status: DC

## 2021-04-05 NOTE — Progress Notes (Signed)
Chief Complaint  Patient presents with   Follow-up   Annual  1. Htn on benicar 20 mg qd and hctz 25 and toprol 25 mg xl  on Kdur 20 but too large will change 2. Atypical chest pain +belching had sob and pressure/heaviness 03/29/21 saw cardiology and has zio on  03/29/21 to 04/12/21 BP at home running 117/80 just took BP meds 3. C/o GERD and greasy stools will have pt fu   Review of Systems  Constitutional:  Negative for weight loss.  HENT:  Negative for hearing loss.   Eyes:  Negative for blurred vision.  Respiratory:  Negative for shortness of breath.   Cardiovascular:  Negative for chest pain.  Gastrointestinal:  Positive for heartburn.  Musculoskeletal:  Negative for joint pain.  Skin:  Negative for rash.  Neurological:  Negative for headaches.  Psychiatric/Behavioral:  Negative for depression. The patient is not nervous/anxious.   Past Medical History:  Diagnosis Date   Anemia    Anxiety 2014   Arthritis    right arm, right knee   Chicken pox    Depression    Frequent headaches    GERD (gastroesophageal reflux disease)    Hyperlipemia    Hypertension    Obesity (BMI 30-39.9)    Pre-diabetes    Sleep apnea    CPAP   Trigger finger    MIDDLE FINGER RIGHT HAND SURGERY ON JULY 31ST 2019   Past Surgical History:  Procedure Laterality Date   COLONOSCOPY WITH PROPOFOL N/A 07/18/2019   Procedure: COLONOSCOPY WITH PROPOFOL;  Surgeon: [name], MD;  Location: [facility];  Service: Endoscopy;  Laterality: N/A;  Latex Sleep apnea   HYSTEROSCOPY WITH D & C N/A 05/20/2018   Procedure: DILATATION AND CURETTAGE /HYSTEROSCOPY;  Surgeon: [name], MD;  Location: [facility];  Service: Gynecology;  Laterality: N/A;   HYSTEROSCOPY WITH D & C N/A 10/25/2020   Procedure: DILATATION AND CURETTAGE /HYSTEROSCOPY;  Surgeon: [name], MD;  Location: [facility];  Service: Gynecology;  Laterality: N/A;   INTRAUTERINE DEVICE (IUD) INSERTION N/A 05/20/2018   Procedure:  INTRAUTERINE DEVICE (IUD) INSERTION;  Surgeon: [name], MD;  Location: [facility];  Service: Gynecology;  Laterality: N/A;   INTRAUTERINE DEVICE (IUD) INSERTION  10/25/2020   Procedure: INTRAUTERINE DEVICE (IUD) INSERTION;  Surgeon: [name], MD;  Location: [facility];  Service: Gynecology;;   WISDOM TOOTH EXTRACTION     Family History  Problem Relation Age of Onset   Arthritis Mother    Hyperlipidemia Mother    Hypertension Mother    Diabetes Mellitus I Father        DM I   Breast cancer Maternal Grandmother 6   Arthritis Paternal Grandmother    Diabetes Mellitus II Paternal Grandmother    Arthritis Paternal Grandfather    Hyperlipidemia Paternal Grandfather    Stroke Paternal Grandfather    Hypertension Paternal Grandfather    Diabetes Mellitus II Sister    Diabetes Mellitus II Sister    Hyperlipidemia Sister    Hypertension Sister    Social History   Socioeconomic History   Marital status: Married    Spouse name: Not on file   Number of children: 2   Years of education: Not on file   Highest education level: Not on file  Occupational History   Occupation: RN  Tobacco Use   Smoking status: Never   Smokeless tobacco: Never  Vaping Use   Vaping Use: Never used  Substance and Sexual Activity  Alcohol use: Yes    Alcohol/week: 2.0 standard drinks    Types: 2 Glasses of wine per week    Comment: OCC-WEEKENDS   Drug use: No   Sexual activity: Yes    Partners: Male    Birth control/protection: Surgical    Comment: vasectomy  Other Topics Concern   Not on file  Social History Narrative   Married    2 kids    Works occup[employer]    Social Determinants of Health   Financial Resource Strain: Not on file  Food Insecurity: Not on file  Transportation Needs: Not on file  Physical Activity: Not on file  Stress: Not on file  Social Connections: Not on file  Intimate Partner Violence: Not on file   Current Meds  Medication Sig   Accu-Chek  FastClix Lancets MISC USE AS DIRECTED ONCE A DAY   ACCU-CHEK GUIDE test strip USE AS DIRECTED TO CHECK SUGAR ONCE A DAY   b complex vitamins capsule Take 1 capsule by mouth daily.   blood glucose meter kit and supplies Dispense based on patient and insurance preference. Use up to four times daily as directed. (FOR ICD-10 E10.9, E11.9).   Cholecalciferol (VITAMIN D3) 125 MCG (5000 UT) CAPS Take 5,000 Units by mouth daily.   Cyanocobalamin (B-12) 1000 MCG TABS Take 1,000 mcg by mouth daily.   docusate sodium (COLACE) 100 MG capsule Take 100 mg by mouth daily.    ELDERBERRY PO Take 1 each by mouth 2 (two) times daily. Gummies   ferrous sulfate 325 (65 FE) MG tablet Take 325 mg by mouth daily with breakfast.    metoprolol succinate (TOPROL-XL) 25 MG 24 hr tablet Take 1 tablet (25 mg total) by mouth daily.   olmesartan (BENICAR) 20 MG tablet TAKE 1 TABLET BY MOUTH EVERY DAY   pravastatin (PRAVACHOL) 20 MG tablet Take 1 tablet (20 mg total) by mouth daily. After 6 pm   triamcinolone cream (KENALOG) 0.1 % Apply 1 application topically 2 (two) times daily.   triamterene-hydrochlorothiazide (MAXZIDE-25) 37.5-25 MG tablet Take 1 tablet by mouth daily. In am   TURMERIC PO Take 500 mg by mouth daily. With ginger    [DISCONTINUED] hydrochlorothiazide (HYDRODIURIL) 25 MG tablet TAKE 1 TABLET BY MOUTH EVERY DAY   [DISCONTINUED] potassium chloride SA (KLOR-CON) 20 MEQ tablet Take 1 tablet (20 mEq total) by mouth daily.   Allergies  Allergen Reactions   Latex Itching    GLOVES (extended exposure)   Recent Results (from the past 2160 hour(s))  Hepatitis C antibody     Status: None   Collection Time: 03/13/21  8:10 AM  Result Value Ref Range   Hepatitis C Ab NON-REACTIVE NON-REACTIVE   SIGNAL TO CUT-OFF 0.00 <1.00    Comment: . HCV antibody was non-reactive. There is no laboratory  evidence of HCV infection. . In most cases, no further action is required. However, if recent HCV exposure is suspected,  a test for HCV RNA (test code [phone]) is suggested. . For additional information please refer to http://education.quest[facility].com/faq/FAQ22v1 (This link is being provided for informational/ educational purposes only.) .   TSH     Status: None   Collection Time: 03/13/21  8:10 AM  Result Value Ref Range   TSH 1.89 0.35 - 4.50 uIU/mL  Urinalysis, Routine w reflex microscopic     Status: None   Collection Time: 03/13/21  8:10 AM  Result Value Ref Range   Color, Urine YELLOW YELLOW   APPearance CLEAR  CLEAR   Specific Gravity, Urine 1.005 1.001 - 1.035   pH 5.5 5.0 - 8.0   Glucose, UA NEGATIVE NEGATIVE   Bilirubin Urine NEGATIVE NEGATIVE   Ketones, ur NEGATIVE NEGATIVE   Hgb urine dipstick NEGATIVE NEGATIVE   Protein, ur NEGATIVE NEGATIVE   Nitrite NEGATIVE NEGATIVE   Leukocytes,Ua NEGATIVE NEGATIVE  Hemoglobin A1c     Status: None   Collection Time: 03/13/21  8:10 AM  Result Value Ref Range   Hgb A1c MFr Bld 6.4 4.6 - 6.5 %    Comment: Glycemic Control Guidelines for People with Diabetes:Non Diabetic:  <6%Goal of Therapy: <7%Additional Action Suggested:  >8%   CBC with Differential/Platelet     Status: Abnormal   Collection Time: 03/13/21  8:10 AM  Result Value Ref Range   WBC 5.8 4.0 - 10.5 K/uL   RBC 3.97 3.87 - 5.11 Mil/uL   Hemoglobin 11.7 (L) 12.0 - 15.0 g/dL   HCT 34.6 (L) 36.0 - 46.0 %   MCV 87.1 78.0 - 100.0 fl   MCHC 34.0 30.0 - 36.0 g/dL   RDW 14.0 11.5 - 15.5 %   Platelets 365.0 150.0 - 400.0 K/uL   Neutrophils Relative % 61.5 43.0 - 77.0 %   Lymphocytes Relative 30.2 12.0 - 46.0 %   Monocytes Relative 6.0 3.0 - 12.0 %   Eosinophils Relative 1.7 0.0 - 5.0 %   Basophils Relative 0.6 0.0 - 3.0 %   Neutro Abs 3.6 1.4 - 7.7 K/uL   Lymphs Abs 1.8 0.7 - 4.0 K/uL   Monocytes Absolute 0.4 0.1 - 1.0 K/uL   Eosinophils Absolute 0.1 0.0 - 0.7 K/uL   Basophils Absolute 0.0 0.0 - 0.1 K/uL  Lipid panel     Status: Abnormal   Collection Time: 03/13/21  8:10 AM   Result Value Ref Range   Cholesterol 194 0 - 200 mg/dL    Comment: ATP III Classification       Desirable:  < 200 mg/dL               Borderline High:  200 - 239 mg/dL          High:  > = 240 mg/dL   Triglycerides 52.0 0.0 - 149.0 mg/dL    Comment: Normal:  <150 mg/dLBorderline High:  150 - 199 mg/dL   HDL 61.40 >39.00 mg/dL   VLDL 10.4 0.0 - 40.0 mg/dL   LDL Cholesterol 122 (H) 0 - 99 mg/dL   Total CHOL/HDL Ratio 3     Comment:                Men          Women1/2 Average Risk     3.4          3.3Average Risk          5.0          4.42X Average Risk          9.6          7.13X Average Risk          15.0          11.0                       NonHDL 132.25     Comment: NOTE:  Non-HDL goal should be 30 mg/dL higher than patient's LDL goal (i.e. LDL goal of < 70 mg/dL, would have non-HDL goal of < 100 mg/dL)  Comprehensive metabolic panel     Status: Abnormal   Collection Time: 03/13/21  8:10 AM  Result Value Ref Range   Sodium 136 135 - 145 mEq/L   Potassium 3.2 (L) 3.5 - 5.1 mEq/L   Chloride 97 96 - 112 mEq/L   CO2 27 19 - 32 mEq/L   Glucose, Bld 114 (H) 70 - 99 mg/dL   BUN 19 6 - 23 mg/dL   Creatinine, Ser 0.86 0.40 - 1.20 mg/dL   Total Bilirubin 0.4 0.2 - 1.2 mg/dL   Alkaline Phosphatase 73 39 - 117 U/L   AST 16 0 - 37 U/L   ALT 17 0 - 35 U/L   Total Protein 7.6 6.0 - 8.3 g/dL   Albumin 4.5 3.5 - 5.2 g/dL   GFR 80.71 >60.00 mL/min    Comment: Calculated using the CKD-EPI Creatinine Equation (2021)   Calcium 9.5 8.4 - 10.5 mg/dL  Basic metabolic panel     Status: Abnormal   Collection Time: 03/19/21  8:46 PM  Result Value Ref Range   Sodium 138 135 - 145 mmol/L   Potassium 3.4 (L) 3.5 - 5.1 mmol/L   Chloride 100 98 - 111 mmol/L   CO2 27 22 - 32 mmol/L   Glucose, Bld 148 (H) 70 - 99 mg/dL    Comment: Glucose reference range applies only to samples taken after fasting for at least 8 hours.   BUN 13 6 - 20 mg/dL   Creatinine, Ser 0.90 0.44 - 1.00 mg/dL   Calcium 9.2 8.9 - 10.3  mg/dL   GFR, Estimated >60 >60 mL/min    Comment: (NOTE) Calculated using the CKD-EPI Creatinine Equation (2021)    Anion gap 11 5 - 15    Comment: Performed at [facility], [address] [place], [place] [address]  CBC     Status: Abnormal   Collection Time: 03/19/21  8:46 PM  Result Value Ref Range   WBC 8.9 4.0 - 10.5 K/uL   RBC 3.80 (L) 3.87 - 5.11 MIL/uL   Hemoglobin 11.2 (L) 12.0 - 15.0 g/dL   HCT 33.7 (L) 36.0 - 46.0 %   MCV 88.7 80.0 - 100.0 fL   MCH 29.5 26.0 - 34.0 pg   MCHC 33.2 30.0 - 36.0 g/dL   RDW 13.2 11.5 - 15.5 %   Platelets 322 150 - 400 K/uL   nRBC 0.0 0.0 - 0.2 %    Comment: Performed at [facility], [place], [place] [address]  Troponin I (High Sensitivity)     Status: None   Collection Time: 03/19/21  8:46 PM  Result Value Ref Range   Troponin I (High Sensitivity) 5 <18 ng/L    Comment: (NOTE) Elevated high sensitivity troponin I (hsTnI) values and significant  changes across serial measurements may suggest ACS but many other  chronic and acute conditions are known to elevate hsTnI results.  Refer to the "Links" section for chest pain algorithms and additional  guidance. Performed at [facility], [place], [place] [address]   Troponin I (High Sensitivity)     Status: None   Collection Time: 03/19/21 10:39 PM  Result Value Ref Range   Troponin I (High Sensitivity) 5 <18 ng/L    Comment: (NOTE) Elevated high sensitivity troponin I (hsTnI) values and significant  changes across serial measurements may suggest ACS but many other  chronic and acute conditions are known to elevate hsTnI results.  Refer to  the "Links" section for chest pain algorithms and additional  guidance. Performed at [facility], [place], [place] [address]   Resp Panel by RT-PCR (Flu A&B, Covid) Nasopharyngeal Swab     Status: None   Collection Time: 03/20/21 12:45 AM   Specimen:  Nasopharyngeal Swab; Nasopharyngeal(NP) swabs in vial transport medium  Result Value Ref Range   SARS Coronavirus 2 by RT PCR NEGATIVE NEGATIVE    Comment: (NOTE) SARS-CoV-2 target nucleic acids are NOT DETECTED.  The SARS-CoV-2 RNA is generally detectable in upper respiratory specimens during the acute phase of infection. The lowest concentration of SARS-CoV-2 viral copies this assay can detect is 138 copies/mL. A negative result does not preclude SARS-Cov-2 infection and should not be used as the sole basis for treatment or other patient management decisions. A negative result may occur with  improper specimen collection/handling, submission of specimen other than nasopharyngeal swab, presence of viral mutation(s) within the areas targeted by this assay, and inadequate number of viral copies(<138 copies/mL). A negative result must be combined with clinical observations, patient history, and epidemiological information. The expected result is Negative.  Fact Sheet for Patients:  EntrepreneurPulse.com.au  Fact Sheet for Healthcare Providers:  IncredibleEmployment.be  This test is no t yet approved or cleared by the [place] FDA and  has been authorized for detection and/or diagnosis of SARS-CoV-2 by FDA under an Emergency Use Authorization (EUA). This EUA will remain  in effect (meaning this test can be used) for the duration of the COVID-19 declaration under Section 564(b)(1) of the Act, 21 U.S.C.section 360bbb-3(b)(1), unless the authorization is terminated  or revoked sooner.       Influenza A by PCR NEGATIVE NEGATIVE   Influenza B by PCR NEGATIVE NEGATIVE    Comment: (NOTE) The Xpert Xpress SARS-CoV-2/FLU/RSV plus assay is intended as an aid in the diagnosis of influenza from Nasopharyngeal swab specimens and should not be used as a sole basis for treatment. Nasal washings and aspirates are unacceptable for Xpert Xpress  SARS-CoV-2/FLU/RSV testing.  Fact Sheet for Patients: EntrepreneurPulse.com.au  Fact Sheet for Healthcare Providers: IncredibleEmployment.be  This test is not yet approved or cleared by the [place] FDA and has been authorized for detection and/or diagnosis of SARS-CoV-2 by FDA under an Emergency Use Authorization (EUA). This EUA will remain in effect (meaning this test can be used) for the duration of the COVID-19 declaration under Section 564(b)(1) of the Act, 21 U.S.C. section 360bbb-3(b)(1), unless the authorization is terminated or revoked.  Performed at [facility], [address] [place], [place] [address]   D-dimer, quantitative     Status: None   Collection Time: 03/20/21 12:45 AM  Result Value Ref Range   D-Dimer, Quant 0.30 0.00 - 0.50 ug/mL-FEU    Comment: (NOTE) At the manufacturer cut-off value of 0.5 g/mL FEU, this assay has a negative predictive value of 95-100%.This assay is intended for use in conjunction with a clinical pretest probability (PTP) assessment model to exclude pulmonary embolism (PE) and deep venous thrombosis (DVT) in outpatients suspected of PE or DVT. Results should be correlated with clinical presentation. Performed at [facility], [address] [place], [place] [address]   Basic metabolic panel     Status: Abnormal   Collection Time: 03/29/21  2:55 PM  Result Value Ref Range   Glucose 107 (H) 65 - 99 mg/dL   BUN 13 6 - 24 mg/dL   Creatinine, Ser 0.80 0.57 - 1.00 mg/dL  eGFR 92 >59 mL/min/1.73   BUN/Creatinine Ratio 16 9 - 23   Sodium 138 134 - 144 mmol/L   Potassium 4.0 3.5 - 5.2 mmol/L   Chloride 100 96 - 106 mmol/L   CO2 19 (L) 20 - 29 mmol/L   Calcium 9.6 8.7 - 10.2 mg/dL  CBC w/Diff     Status: None   Collection Time: 03/29/21  2:55 PM  Result Value Ref Range   WBC 6.8 3.4 - 10.8 x10E3/uL   RBC 4.14 3.77 - 5.28 x10E6/uL   Hemoglobin 11.9 11.1 - 15.9 g/dL    Hematocrit 35.4 34.0 - 46.6 %   MCV 86 79 - 97 fL   MCH 28.7 26.6 - 33.0 pg   MCHC 33.6 31.5 - 35.7 g/dL   RDW 13.0 11.7 - 15.4 %   Platelets 374 150 - 450 x10E3/uL   Neutrophils 72 Not Estab. %   Lymphs 20 Not Estab. %   Monocytes 7 Not Estab. %   Eos 1 Not Estab. %   Basos 0 Not Estab. %   Neutrophils Absolute 4.9 1.4 - 7.0 x10E3/uL   Lymphocytes Absolute 1.4 0.7 - 3.1 x10E3/uL   Monocytes Absolute 0.4 0.1 - 0.9 x10E3/uL   EOS (ABSOLUTE) 0.1 0.0 - 0.4 x10E3/uL   Basophils Absolute 0.0 0.0 - 0.2 x10E3/uL   Immature Granulocytes 0 Not Estab. %   Immature Grans (Abs) 0.0 0.0 - 0.1 x10E3/uL  Magnesium     Status: None   Collection Time: 03/29/21  2:55 PM  Result Value Ref Range   Magnesium 2.1 1.6 - 2.3 mg/dL   Objective  Body mass index is 38.5 kg/m. Wt Readings from Last 3 Encounters:  04/05/21 238 lb 6.4 oz (108.1 kg)  03/29/21 241 lb (109.3 kg)  03/19/21 230 lb (104.3 kg)   Temp Readings from Last 3 Encounters:  04/05/21 97.8 F (36.6 C)  03/19/21 98.6 F (37 C) (Oral)  10/25/20 (!) 97.5 F (36.4 C) (Oral)   BP Readings from Last 3 Encounters:  04/05/21 132/88  03/29/21 126/74  03/20/21 136/75   Pulse Readings from Last 3 Encounters:  04/05/21 100  03/29/21 88  03/20/21 87    Physical Exam Vitals and nursing note reviewed.  Constitutional:      Appearance: Normal appearance. She is well-developed and well-groomed. She is obese.  HENT:     Head: Normocephalic and atraumatic.  Eyes:     Conjunctiva/sclera: Conjunctivae normal.     Pupils: Pupils are equal, round, and reactive to light.  Cardiovascular:     Rate and Rhythm: Normal rate and regular rhythm.     Heart sounds: Normal heart sounds. No murmur heard. Pulmonary:     Effort: Pulmonary effort is normal.     Breath sounds: Normal breath sounds.  Skin:    General: Skin is warm and dry.  Neurological:     General: No focal deficit present.     Mental Status: She is alert and oriented to person,  place, and time. Mental status is at baseline.     Gait: Gait normal.  Psychiatric:        Attention and Perception: Attention and perception normal.        Mood and Affect: Mood and affect normal.        Speech: Speech normal.        Behavior: Behavior normal. Behavior is cooperative.        Thought Content: Thought content normal.  Cognition and Memory: Cognition and memory normal.        Judgment: Judgment normal.    Assessment  Plan  Annual physical exam Flu shot utd  utd tdap had at work in 2021 check date for me 08/20/20  Immune hep B and MMR  covid 2/2 had and pfizer 08/2020 consider 4th dose   Consider pna 23 vaccine   Pap [facility] 03/23/18 neg no HPV testing done Endometrial Bx 05/20/18 negative with IUD now  Has IUD DUB 10/25/20 uterine fibroids x 2, IUD out, left 3.8 cyst   mammo 06/18/20 negative ordered    Colonoscopy 07/18/2019 normal [facility] repeat in 10 years    Declines HIV testing  rec healthy diet and exercise  rec take D3 2000 IUD daily   Ortho seeing for shoulder/ arm pain tried steroid injection, massage helped consider MRI in the past  Screening mammogram, encounter for - Plan: MM 3D SCREEN BREAST BILATERAL  Hypertension, sl elevated today controlled at home with low K - Plan: triamterene-hydrochlorothiazide (MAXZIDE-25) 37.5-25 MG tablet benicar 20 mg qd and hctz 25 and toprol 25 mg xl  on Kdur 20 but too large will change maxzide 37.5-25 stop Kdur  Gastroesophageal reflux disease without esophagitis - Plan: Ambulatory referral to Gastroenterology Change in stool - Plan: Ambulatory referral to Gastroenterology  Prediabetes     Provider: Dr. Olivia Mackie [name]-Internal Medicine

## 2021-04-05 NOTE — Patient Instructions (Signed)
Prediabetes Prediabetes is when your blood sugar (blood glucose) level is higher than normal but not high enough for you to be diagnosed with type 2 diabetes. Having prediabetes puts you at risk for developing type 2 diabetes (type 2 diabetes mellitus). With certain lifestyle changes, you may be able to prevent or delay the onset of type 2 diabetes. This is important because type 2 diabetes can lead to serious complications, such as: Heart disease. Stroke. Blindness. Kidney disease. Depression. Poor circulation in the feet and legs. In severe cases, this could lead to surgical removal of a leg (amputation). What are the causes? The exact cause of prediabetes is not known. It may result from insulin resistance. Insulin resistance develops when cells in the body do not respond properly to insulin that the body makes. This can cause excess glucose to build up in the blood. High blood glucose (hyperglycemia) can develop. What increases the risk? The following factors may make you more likely to develop this condition: You have a family member with type 2 diabetes. You are older than 45 years. You had a temporary form of diabetes during a pregnancy (gestational diabetes). You had polycystic ovary syndrome (PCOS). You are overweight or obese. You are inactive (sedentary). You have a history of heart disease, including problems with cholesterol levels, high levels of blood fats, or high blood pressure. What are the signs or symptoms? You may have no symptoms. If you do have symptoms, they may include: Increased hunger. Increased thirst. Increased urination. Vision changes, such as blurry vision. Tiredness (fatigue). How is this diagnosed? This condition can be diagnosed with blood tests. Your blood glucose may be checked with one or more of the following tests: A fasting blood glucose (FBG) test. You will not be allowed to eat (you will fast) for at least 8 hours before a blood sample is  taken. An A1C blood test (hemoglobin A1C). This test provides information about blood glucose levels over the previous 2?3 months. An oral glucose tolerance test (OGTT). This test measures your blood glucose at two points in time: After fasting. This is your baseline level. Two hours after you drink a beverage that contains glucose. You may be diagnosed with prediabetes if: Your FBG is 100?125 mg/dL (3.5-4.6 mmol/L). Your A1C level is 5.7?6.4% (39-46 mmol/mol). Your OGTT result is 140?199 mg/dL (5.6-81 mmol/L). These blood tests may be repeated to confirm your diagnosis. How is this treated? Treatment may include dietary and lifestyle changes to help lower your blood glucose and prevent type 2 diabetes from developing. In some cases, medicinemay be prescribed to help lower the risk of type 2 diabetes. Follow these instructions at home: Nutrition  Follow a healthy meal plan. This includes eating lean proteins, whole grains, legumes, fresh fruits and vegetables, low-fat dairy products, and healthy fats. Follow instructions from your health care provider about eating or drinking restrictions. Meet with a dietitian to create a healthy eating plan that is right for you.  Lifestyle Do moderate-intensity exercise for at least 30 minutes a day on 5 or more days each week, or as told by your health care provider. A mix of activities may be best, such as: Brisk walking, swimming, biking, and weight lifting. Lose weight as told by your health care provider. Losing 5-7% of your body weight can reverse insulin resistance. Do not drink alcohol if: Your health care provider tells you not to drink. You are pregnant, may be pregnant, or are planning to become pregnant. If you drink alcohol:  Limit how much you use to: 0-1 drink a day for women. 0-2 drinks a day for men. Be aware of how much alcohol is in your drink. In the [place] one drink equals one 12 oz bottle of beer (355 mL), one 5 oz glass of wine  (148 mL), or one 1 oz glass of hard liquor (44 mL). General instructions Take over-the-counter and prescription medicines only as told by your health care provider. You may be prescribed medicines that help lower the risk of type 2 diabetes. Do not use any products that contain nicotine or tobacco, such as cigarettes, e-cigarettes, and chewing tobacco. If you need help quitting, ask your health care provider. Keep all follow-up visits. This is important. Where to find more information American Diabetes Association: www.diabetes.org Academy of Nutrition and Dietetics: www.eatright.org American Heart Association: www.heart.org Contact a health care provider if: You have any of these symptoms: Increased hunger. Increased urination. Increased thirst. Fatigue. Vision changes, such as blurry vision. Get help right away if you: Have shortness of breath. Feel confused. Vomit or feel like you may vomit. Summary Prediabetes is when your blood sugar (blood glucose)level is higher than normal but not high enough for you to be diagnosed with type 2 diabetes. Having prediabetes puts you at risk for developing type 2 diabetes (type 2 diabetes mellitus). Make lifestyle changes such as eating a healthy diet and exercising regularly to help prevent diabetes. Lose weight as told by your health care provider. This information is not intended to replace advice given to you by your health care provider. Make sure you discuss any questions you have with your healthcare provider. Document Revised: 01/12/2020 Document Reviewed: 01/12/2020 Elsevier Patient Education  2022 Else[employer] Inc.  Prediabetes Eating Plan Prediabetes is a condition that causes blood sugar (glucose) levels to be higher than normal. This increases the risk for developing type 2 diabetes (type 2 diabetes mellitus). Working with a health care provider or nutrition specialist (dietitian) to make diet and lifestyle changes can help prevent the  onset of diabetes. These changes may help you: Control your blood glucose levels. Improve your cholesterol levels. Manage your blood pressure. What are tips for following this plan? Reading food labels Read food labels to check the amount of fat, salt (sodium), and sugar in prepackaged foods. Avoid foods that have: Saturated fats. Trans fats. Added sugars. Avoid foods that have more than 300 milligrams (mg) of sodium per serving. Limit your sodium intake to less than 2,300 mg each day. Shopping Avoid buying pre-made and processed foods. Avoid buying drinks with added sugar. Cooking Cook with olive oil. Do not use butter, lard, or ghee. Bake, broil, grill, steam, or boil foods. Avoid frying. Meal planning  Work with your dietitian to create an eating plan that is right for you. This may include tracking how many calories you take in each day. Use a food diary, notebook, or mobile application to track what you eat at each meal. Consider following a Mediterranean diet. This includes: Eating several servings of fresh fruits and vegetables each day. Eating fish at least twice a week. Eating one serving each day of whole grains, beans, nuts, and seeds. Using olive oil instead of other fats. Limiting alcohol. Limiting red meat. Using nonfat or low-fat dairy products. Consider following a plant-based diet. This includes dietary choices that focus on eating mostly vegetables and fruit, grains, beans, nuts, and seeds. If you have high blood pressure, you may need to limit your sodium intake or follow a  diet such as the DASH (Dietary Approaches to Stop Hypertension) eating plan. The DASH diet aims to lower high blood pressure.  Lifestyle Set weight loss goals with help from your health care team. It is recommended that most people with prediabetes lose 7% of their body weight. Exercise for at least 30 minutes 5 or more days a week. Attend a support group or seek support from a mental health  counselor. Take over-the-counter and prescription medicines only as told by your health care provider. What foods are recommended? Fruits Berries. Bananas. Apples. Oranges. Grapes. Papaya. Mango. Pomegranate. Kiwi.Grapefruit. Cherries. Vegetables Lettuce. Spinach. Peas. Beets. Cauliflower. Cabbage. Broccoli. Carrots.Tomatoes. Squash. Eggplant. Herbs. Peppers. Onions. Cucumbers. Brussels sprouts. Grains Whole grains, such as whole-wheat or whole-grain breads, crackers, cereals, and pasta. Unsweetened oatmeal. Bulgur. Barley. Quinoa. Brown rice. Corn orwhole-wheat flour tortillas or taco shells. Meats and other proteins Seafood. Poultry without skin. Lean cuts of pork and beef. Tofu. Eggs. Nuts.Beans. Dairy Low-fat or fat-free dairy products, such as yogurt, cottage cheese, and cheese. Beverages Water. Tea. Coffee. Sugar-free or diet soda. Seltzer water. Low-fat or nonfatmilk. Milk alternatives, such as soy or almond milk. Fats and oils Olive oil. Canola oil. Sunflower oil. Grapeseed oil. Avocado. Walnuts. Sweets and desserts Sugar-free or low-fat pudding. Sugar-free or low-fat ice cream and other frozentreats. Seasonings and condiments Herbs. Sodium-free spices. Mustard. Relish. Low-salt, low-sugar ketchup.Low-salt, low-sugar barbecue sauce. Low-fat or fat-free mayonnaise. The items listed above may not be a complete list of recommended foods and beverages. Contact a dietitian for more information. What foods are not recommended? Fruits Fruits canned with syrup. Vegetables Canned vegetables. Frozen vegetables with butter or cream sauce. Grains Refined white flour and flour products, such as bread, pasta, snack foods, andcereals. Meats and other proteins Fatty cuts of meat. Poultry with skin. Breaded or fried meat. Processed meats. Dairy Full-fat yogurt, cheese, or milk. Beverages Sweetened drinks, such as iced tea and soda. Fats and oils Butter. Lard. Ghee. Sweets and  desserts Baked goods, such as cake, cupcakes, pastries, cookies, and cheesecake. Seasonings and condiments Spice mixes with added salt. Ketchup. Barbecue sauce. Mayonnaise. The items listed above may not be a complete list of foods and beverages that are not recommended. Contact a dietitian for more information. Where to find more information American Diabetes Association: www.diabetes.org Summary You may need to make diet and lifestyle changes to help prevent the onset of diabetes. These changes can help you control blood sugar, improve cholesterol levels, and manage blood pressure. Set weight loss goals with help from your health care team. It is recommended that most people with prediabetes lose 7% of their body weight. Consider following a Mediterranean diet. This includes eating plenty of fresh fruits and vegetables, whole grains, beans, nuts, seeds, fish, and low-fat dairy, and using olive oil instead of other fats. This information is not intended to replace advice given to you by your health care provider. Make sure you discuss any questions you have with your healthcare provider. Document Revised: 01/12/2020 Document Reviewed: 01/12/2020 Elsevier Patient Education  2022 Elsevier Inc.  Budget-Friendly Healthy Eating There are many ways to save money at the grocery store and continue to eat healthy. You can be successful if you: Plan meals according to your budget. Make a grocery list and only purchase food according to your grocery list. Prepare food yourself at home. What are tips for following this plan? Reading food labels Compare food labels between brand name foods and the store brand. Often the nutritional value is the  same, but the store brand is lower cost. Look for products that do not have added sugar, fat, or salt (sodium). These often cost the same but are healthier for you. Products may be labeled as: Sugar-free. Nonfat. Low-fat. Sodium-free. Low-sodium. Look for  lean ground beef labeled as at least 92% lean and 8% fat. Shopping  Buy only the items on your grocery list and go only to the areas of the store that have the items on your list. Use coupons only for foods and brands you normally buy. Avoid buying items you wouldn't normally buy simply because they are on sale. Check online and in newspapers for weekly deals. Buy healthy items from the bulk bins when available, such as herbs, spices, flour, pasta, nuts, and dried fruit. Buy fruits and vegetables that are in season. Prices are usually lower on in-season produce. Look at the unit price on the price tag. Use it to compare different brands and sizes to find out which item is the best deal. Choose healthy items that are often low-cost, such as carrots, potatoes, apples, bananas, and oranges. Dried or canned beans are a low-cost protein source. Buy in bulk and freeze extra food. Items you can buy in bulk include meats, fish, poultry, frozen fruits, and frozen vegetables. Avoid buying "ready-to-eat" foods, such as pre-cut fruits and vegetables and pre-made salads. If possible, shop around to discover where you can find the best prices. Consider other retailers such as dollar stores, larger [employer], local fruit and vegetable stands, and farmers markets. Do not shop when you are hungry. If you shop while hungry, it may be hard to stick to your list and budget. Resist impulse buying. Use your grocery list as your official plan for the week. Buy a variety of vegetables and fruits by purchasing fresh, frozen, and canned items. Look at the top and bottom shelves for deals. Foods at eye level (eye level of an adult or child) are usually more expensive. Be efficient with your time when shopping. The more time you spend at the store, the more money you are likely to spend. To save money when choosing more expensive foods like meats and dairy: Choose cheaper cuts of meat, such as bone-in chicken thighs  and drumsticks instead of skinless and boneless chicken. When you are ready to prepare the chicken, you can remove the skin yourself to make it healthier. Choose lean meats like chicken or [place] instead of beef. Choose canned seafood, such as tuna, salmon, or sardines. Buy eggs as a low-cost source of protein. Buy dried beans and peas, such as lentils, split peas, or kidney beans instead of meats. Dried beans and peas are a good alternative source of protein. Buy the larger tubs of yogurt instead of individual-sized containers. Choose water instead of sodas and other sweetened beverages. Avoid buying chips, cookies, and other "junk food." These items are usually expensive and not healthy.  Cooking Make extra food and freeze the extras in meal-sized containers or in individual portions for fast meals and snacks. Pre-cook on days when you have extra time to prepare meals in advance. You can keep these meals in the fridge or freezer and reheat for a quick meal. When you come home from the grocery store, wash, peel, and cut fruits and vegetables so they are ready to use and eat. This will help reduce food waste. Meal planning Do not eat out or get fast food. Prepare food at home. Make a grocery list and make sure to  bring it with you to the store. If you have a smart phone, you could use your phone to create your shopping list. Plan meals and snacks according to a grocery list and budget you create. Use leftovers in your meal plan for the week. Look for recipes where you can cook once and make enough food for two meals. Prepare budget-friendly types of meals like stews, casseroles, and stir-fry dishes. Try some meatless meals or try "no cook" meals like salads. Make sure that half your plate is filled with fruits or vegetables. Choose from fresh, frozen, or canned fruits and vegetables. If eating canned, remember to rinse them before eating. This will remove any excess salt added for  packaging. Summary Eating healthy on a budget is possible if you plan your meals according to your budget, purchase according to your budget and grocery list, and prepare food yourself. Tips for buying more food on a limited budget include buying generic brands, using coupons only for foods you normally buy, and buying healthy items from the bulk bins when available. Tips for buying cheaper food to replace expensive food include choosing cheaper, lean cuts of meat, and buying dried beans and peas. This information is not intended to replace advice given to you by your health care provider. Make sure you discuss any questions you have with your healthcare provider. Document Revised: 07/26/2020 Document Reviewed: 07/26/2020 Elsevier Patient Education  2022 [employer]

## 2021-04-10 ENCOUNTER — Encounter: Payer: Self-pay | Admitting: *Deleted

## 2021-04-12 DIAGNOSIS — R079 Chest pain, unspecified: Secondary | ICD-10-CM

## 2021-04-12 DIAGNOSIS — R002 Palpitations: Secondary | ICD-10-CM | POA: Diagnosis not present

## 2021-04-23 ENCOUNTER — Telehealth: Payer: Self-pay | Admitting: *Deleted

## 2021-04-23 NOTE — Telephone Encounter (Signed)
Attempted to call pt to review results. No answer. Left detailed message (ok per DPR) notifying pt I am following up to make sure has no further questions regarding results. Otherwise, results were released to MyChart by provider.

## 2021-04-23 NOTE — Telephone Encounter (Signed)
-----   Message from Lennon Alstrom, PA-C sent at 04/20/2021  7:05 PM EDT ----- Results sent to pt via MyChart. NSR with PAC/PVCs and no sustained arrhythmia. Echo still pending. No new recommendations at this time.

## 2021-04-30 ENCOUNTER — Ambulatory Visit: Payer: BC Managed Care – PPO | Admitting: Physician Assistant

## 2021-05-15 ENCOUNTER — Other Ambulatory Visit: Payer: Self-pay

## 2021-05-15 ENCOUNTER — Ambulatory Visit (INDEPENDENT_AMBULATORY_CARE_PROVIDER_SITE_OTHER): Payer: BC Managed Care – PPO

## 2021-05-15 DIAGNOSIS — R011 Cardiac murmur, unspecified: Secondary | ICD-10-CM | POA: Diagnosis not present

## 2021-05-15 DIAGNOSIS — R072 Precordial pain: Secondary | ICD-10-CM

## 2021-05-15 LAB — ECHOCARDIOGRAM COMPLETE
AR max vel: 2.74 cm2
AV Area VTI: 2.55 cm2
AV Area mean vel: 2.6 cm2
AV Mean grad: 5 mmHg
AV Peak grad: 9.2 mmHg
Ao pk vel: 1.52 m/s
Area-P 1/2: 3.85 cm2
Calc EF: 50.8 %
S' Lateral: 3.1 cm
Single Plane A2C EF: 50.9 %
Single Plane A4C EF: 52.4 %

## 2021-05-17 ENCOUNTER — Encounter: Payer: Self-pay | Admitting: Cardiology

## 2021-05-17 ENCOUNTER — Ambulatory Visit (INDEPENDENT_AMBULATORY_CARE_PROVIDER_SITE_OTHER): Payer: BC Managed Care – PPO | Admitting: Cardiology

## 2021-05-17 ENCOUNTER — Other Ambulatory Visit: Payer: Self-pay

## 2021-05-17 VITALS — BP 120/76 | HR 80 | Ht 66.0 in | Wt 236.0 lb

## 2021-05-17 DIAGNOSIS — I10 Essential (primary) hypertension: Secondary | ICD-10-CM

## 2021-05-17 DIAGNOSIS — R002 Palpitations: Secondary | ICD-10-CM | POA: Diagnosis not present

## 2021-05-17 DIAGNOSIS — E7800 Pure hypercholesterolemia, unspecified: Secondary | ICD-10-CM

## 2021-05-17 DIAGNOSIS — Z6838 Body mass index (BMI) 38.0-38.9, adult: Secondary | ICD-10-CM

## 2021-05-17 NOTE — Patient Instructions (Signed)
Medication Instructions:  ° °Your physician recommends that you continue on your current medications as directed. Please refer to the Current Medication list given to you today. ° °*If you need a refill on your cardiac medications before your next appointment, please call your pharmacy* ° ° °Lab Work: ° °None Ordered ° °If you have labs (blood work) drawn today and your tests are completely normal, you will receive your results only by: °MyChart Message (if you have MyChart) OR °A paper copy in the mail °If you have any lab test that is abnormal or we need to change your treatment, we will call you to review the results. ° ° °Testing/Procedures: ° °None Ordered ° ° °Follow-Up: °At [facility], you and your health needs are our priority.  As part of our continuing mission to provide you with exceptional heart care, we have created designated Provider Care Teams.  These Care Teams include your primary Cardiologist (physician) and Advanced Practice Providers (APPs -  Physician Assistants and Nurse Practitioners) who all work together to provide you with the care you need, when you need it. ° °We recommend signing up for the patient portal called "MyChart".  Sign up information is provided on this After Visit Summary.  MyChart is used to connect with patients for Virtual Visits (Telemedicine).  Patients are able to view lab/test results, encounter notes, upcoming appointments, etc.  Non-urgent messages can be sent to your provider as well.   °To learn more about what you can do with MyChart, go to [email or web address].   ° °Your next appointment:  As Needed ° ° ° °

## 2021-05-17 NOTE — Progress Notes (Signed)
Cardiology Office Note:    Date:  05/17/2021   ID:  Kathleen Burke, DOB 22-Oct-1976, MRN [id number]  PCP:  [name], [name], MD  Cardiologist:  Kate Sable, MD  Electrophysiologist:  None   Referring MD: [name], Olivia Mackie *   Chief Complaint  Patient presents with   Other    Follow up post ECHO and ZIO. Meds reviewed verbally with patient.      History of Present Illness:    Kathleen Burke is a 45 y.o. female with a hx of hypertension, prediabetic, obesity who presents for follow-up.  Last seen due to cardiac murmur and palpitations.  Cardiac monitor was placed to evaluate any significant arrhythmias.  Echocardiogram also ordered to evaluate presence of structural abnormalities.  She feels well, has some shortness of breath when she bends over.  Trying to exercise as much as she can, walks about 10,000 steps a day.   Prior notes  Cardiac monitor 04/17/2021 normal with no evidence of significant arrhythmias. Lexiscan Myoview 02/01/2021 no ischemia, low risk scan. Echo 0/10/7791 normal systolic function, EF 60 to 65%, impaired relaxation.   Past Medical History:  Diagnosis Date   Anemia    Anxiety 2014   Arthritis    right arm, right knee   Chicken pox    Depression    Frequent headaches    GERD (gastroesophageal reflux disease)    Hyperlipemia    Hypertension    Obesity (BMI 30-39.9)    Pre-diabetes    Sleep apnea    CPAP   Trigger finger    MIDDLE FINGER RIGHT HAND SURGERY ON JULY 31ST 2019    Past Surgical History:  Procedure Laterality Date   COLONOSCOPY WITH PROPOFOL N/A 07/18/2019   Procedure: COLONOSCOPY WITH PROPOFOL;  Surgeon: [name], MD;  Location: [facility];  Burke: Endoscopy;  Laterality: N/A;  Latex Sleep apnea   HYSTEROSCOPY WITH D & C N/A 05/20/2018   Procedure: DILATATION AND CURETTAGE /HYSTEROSCOPY;  Surgeon: [name], MD;  Location: [facility];  Burke: Gynecology;  Laterality: N/A;   HYSTEROSCOPY WITH D & C  N/A 10/25/2020   Procedure: DILATATION AND CURETTAGE /HYSTEROSCOPY;  Surgeon: [name], MD;  Location: [facility];  Burke: Gynecology;  Laterality: N/A;   INTRAUTERINE DEVICE (IUD) INSERTION N/A 05/20/2018   Procedure: INTRAUTERINE DEVICE (IUD) INSERTION;  Surgeon: [name], MD;  Location: [facility];  Burke: Gynecology;  Laterality: N/A;   INTRAUTERINE DEVICE (IUD) INSERTION  10/25/2020   Procedure: INTRAUTERINE DEVICE (IUD) INSERTION;  Surgeon: [name], MD;  Location: [facility];  Burke: Gynecology;;   WISDOM TOOTH EXTRACTION      Current Medications: Current Meds  Medication Sig   Accu-Chek FastClix Lancets MISC USE AS DIRECTED ONCE A DAY   ACCU-CHEK GUIDE test strip USE AS DIRECTED TO CHECK SUGAR ONCE A DAY   b complex vitamins capsule Take 1 capsule by mouth daily.   blood glucose meter kit and supplies Dispense based on patient and insurance preference. Use up to four times daily as directed. (FOR ICD-10 E10.9, E11.9).   Cholecalciferol (VITAMIN D3) 125 MCG (5000 UT) CAPS Take 5,000 Units by mouth daily.   Cyanocobalamin (B-12) 1000 MCG TABS Take 1,000 mcg by mouth daily.   docusate sodium (COLACE) 100 MG capsule Take 100 mg by mouth daily.    ELDERBERRY PO Take 1 each by mouth 2 (two) times daily. Gummies   ferrous sulfate 325 (65 FE) MG tablet Take 325 mg by mouth daily with  breakfast.    metoprolol succinate (TOPROL-XL) 25 MG 24 hr tablet Take 1 tablet (25 mg total) by mouth daily.   olmesartan (BENICAR) 20 MG tablet TAKE 1 TABLET BY MOUTH EVERY DAY   pravastatin (PRAVACHOL) 20 MG tablet Take 1 tablet (20 mg total) by mouth daily. After 6 pm   triamterene-hydrochlorothiazide (MAXZIDE-25) 37.5-25 MG tablet Take 1 tablet by mouth daily. In am   TURMERIC PO Take 500 mg by mouth daily. With ginger      Allergies:   Latex   Social History   Socioeconomic History   Marital status: Married    Spouse name: Not on file   Number of children: 2   Years of  education: Not on file   Highest education level: Not on file  Occupational History   Occupation: RN  Tobacco Use   Smoking status: Never   Smokeless tobacco: Never  Vaping Use   Vaping Use: Never used  Substance and Sexual Activity   Alcohol use: Yes    Alcohol/week: 2.0 standard drinks    Types: 2 Glasses of wine per week    Comment: OCC-WEEKENDS   Drug use: No   Sexual activity: Yes    Partners: Male    Birth control/protection: Surgical    Comment: vasectomy  Other Topics Concern   Not on file  Social History Narrative   Married    2 kids    Works occup[employer]    Social Determinants of Health   Financial Resource Strain: Not on file  Food Insecurity: Not on file  Transportation Needs: Not on file  Physical Activity: Not on file  Stress: Not on file  Social Connections: Not on file     Family History: The patient's family history includes Arthritis in her mother, paternal grandfather, and paternal grandmother; Breast cancer (age of onset: 90) in her maternal grandmother; Diabetes Mellitus I in her father; Diabetes Mellitus II in her paternal grandmother, sister, and sister; Hyperlipidemia in her mother, paternal grandfather, and sister; Hypertension in her mother, paternal grandfather, and sister; Stroke in her paternal grandfather.  ROS:   Please see the history of present illness.     All other systems reviewed and are negative.  EKGs/Labs/Other Studies Reviewed:    The following studies were reviewed today:   EKG:  EKG is not ordered today.    Recent Labs: 03/13/2021: ALT 17; TSH 1.89 03/29/2021: BUN 13; Creatinine, Ser 0.80; Hemoglobin 11.9; Magnesium 2.1; Platelets 374; Potassium 4.0; Sodium 138  Recent Lipid Panel    Component Value Date/Time   CHOL 194 03/13/2021 0810   CHOL 215 (H) 06/16/2019 0000   TRIG 52.0 03/13/2021 0810   HDL 61.40 03/13/2021 0810   HDL 72 06/16/2019 0000   CHOLHDL 3 03/13/2021 0810   VLDL 10.4 03/13/2021 0810    LDLCALC 122 (H) 03/13/2021 0810   LDLCALC 134 (H) 06/16/2019 0000   LDLCALC 121 (H) 03/17/2018 1036     Physical Exam:    VS:  BP 120/76 (BP Location: Left Arm, Patient Position: Sitting, Cuff Size: Normal)   Pulse 80   Ht _0  (1.676 m)   Wt 236 lb (107 kg)   SpO2 98%   BMI 38.09 kg/m     Wt Readings from Last 3 Encounters:  05/17/21 236 lb (107 kg)  04/05/21 238 lb 6.4 oz (108.1 kg)  03/29/21 241 lb (109.3 kg)     GEN:  Well nourished, well developed in no acute distress HEENT:  Normal NECK: No JVD; No carotid bruits LYMPHATICS: No lymphadenopathy CARDIAC: RRR, no murmurs, rubs, gallops RESPIRATORY:  Clear to auscultation without rales, wheezing or rhonchi  ABDOMEN: Soft, non-tender, non-distended MUSCULOSKELETAL:  No edema; No deformity  SKIN: Warm and dry NEUROLOGIC:  Alert and oriented x 3 PSYCHIATRIC:  Normal affect   ASSESSMENT:    1. Primary hypertension   2. Palpitations   3. Pure hypercholesterolemia   4. BMI 38.0-38.9,adult     PLAN:    In order of problems listed above:  Hypertension, BP controlled, continue Benicar. Palpitations, cardiac monitor with no significant arrhythmias.  Echocardiogram with preserved ejection fraction, no significant valvular abnormalities, mild MR.  No murmur noted on my exam today. Hyperlipidemia, continue statin Obese.  Exercise, weight loss advised.  Follow-up as needed  This note was generated in part or whole with voice recognition software. Voice recognition is usually quite accurate but there are transcription errors that can and very often do occur. I apologize for any typographical errors that were not detected and corrected.  Medication Adjustments/Labs and Tests Ordered: Current medicines are reviewed at length with the patient today.  Concerns regarding medicines are outlined above.  No orders of the defined types were placed in this encounter.  No orders of the defined types were placed in this  encounter.   Patient Instructions  Medication Instructions:   Your physician recommends that you continue on your current medications as directed. Please refer to the Current Medication list given to you today.  *If you need a refill on your cardiac medications before your next appointment, please call your pharmacy*   Lab Work:  None Ordered  If you have labs (blood work) drawn today and your tests are completely normal, you will receive your results only by: [place] (if you have MyChart) OR A paper copy in the mail If you have any lab test that is abnormal or we need to change your treatment, we will call you to review the results.   Testing/Procedures:  None Ordered   Follow-Up: At [facility], you and your health needs are our priority.  As part of our continuing mission to provide you with exceptional heart care, we have created designated Provider Care Teams.  These Care Teams include your primary Cardiologist (physician) and Advanced Practice Providers (APPs -  Physician Assistants and Nurse Practitioners) who all work together to provide you with the care you need, when you need it.  We recommend signing up for the patient portal called "MyChart".  Sign up information is provided on this After Visit Summary.  MyChart is used to connect with patients for Virtual Visits (Telemedicine).  Patients are able to view lab/test results, encounter notes, upcoming appointments, etc.  Non-urgent messages can be sent to your provider as well.   To learn more about what you can do with MyChart, go to [email or web address].    Your next appointment:  As Needed    Signed, Kate Sable, MD  05/17/2021 11:07 AM    [place]

## 2021-05-31 ENCOUNTER — Ambulatory Visit: Payer: BC Managed Care – PPO | Admitting: Physician Assistant

## 2021-06-10 ENCOUNTER — Other Ambulatory Visit: Payer: Self-pay | Admitting: Internal Medicine

## 2021-06-10 DIAGNOSIS — E876 Hypokalemia: Secondary | ICD-10-CM

## 2021-06-14 ENCOUNTER — Encounter: Payer: Self-pay | Admitting: Internal Medicine

## 2021-06-25 ENCOUNTER — Ambulatory Visit (INDEPENDENT_AMBULATORY_CARE_PROVIDER_SITE_OTHER): Payer: BC Managed Care – PPO | Admitting: Gastroenterology

## 2021-06-25 ENCOUNTER — Other Ambulatory Visit: Payer: Self-pay

## 2021-06-25 ENCOUNTER — Encounter: Payer: Self-pay | Admitting: Gastroenterology

## 2021-06-25 VITALS — BP 129/81 | HR 105 | Ht 66.0 in | Wt 237.6 lb

## 2021-06-25 DIAGNOSIS — R195 Other fecal abnormalities: Secondary | ICD-10-CM | POA: Diagnosis not present

## 2021-06-25 NOTE — Progress Notes (Signed)
Primary Care Physician: [name], [name], MD  Primary Gastroenterologist:  Dr. [name]  Chief Complaint  Patient presents with   Gastroesophageal Reflux    HPI: Kathleen Burke is a 45 y.o. female here for change in bowel habits. The patient reports that she has noticed that her stools are oily and described his greasy.  She also reports that they aren't foul smelling and she has to flush the toilet multiple times to get the stool to go down.  She also reports that the stool smears the commode when she flushes.  She denies any unexplained weight loss fevers chills nausea or vomiting.  She reports that she has some indigestion usually in the morning.  The patient had a colonoscopy that was normal by me back in September 2020.  Past Medical History:  Diagnosis Date   Anemia    Anxiety 2014   Arthritis    right arm, right knee   Chicken pox    Depression    Frequent headaches    GERD (gastroesophageal reflux disease)    Hyperlipemia    Hypertension    Obesity (BMI 30-39.9)    Pre-diabetes    Sleep apnea    CPAP   Trigger finger    MIDDLE FINGER RIGHT HAND SURGERY ON JULY 31ST 2019    Current Outpatient Medications  Medication Sig Dispense Refill   Accu-Chek FastClix Lancets MISC USE AS DIRECTED ONCE A DAY 102 each 12   ACCU-CHEK GUIDE test strip USE AS DIRECTED TO CHECK SUGAR ONCE A DAY 100 strip 3   b complex vitamins capsule Take 1 capsule by mouth daily.     blood glucose meter kit and supplies Dispense based on patient and insurance preference. Use up to four times daily as directed. (FOR ICD-10 E10.9, E11.9). 1 each 0   Cholecalciferol (VITAMIN D3) 125 MCG (5000 UT) CAPS Take 5,000 Units by mouth daily.     Cyanocobalamin (B-12) 1000 MCG TABS Take 1,000 mcg by mouth daily.     docusate sodium (COLACE) 100 MG capsule Take 100 mg by mouth daily.      ELDERBERRY PO Take 1 each by mouth 2 (two) times daily. Gummies     ferrous sulfate 325 (65 FE) MG tablet Take  325 mg by mouth daily with breakfast.      metoprolol succinate (TOPROL-XL) 25 MG 24 hr tablet Take 1 tablet (25 mg total) by mouth daily. 90 tablet 3   olmesartan (BENICAR) 20 MG tablet TAKE 1 TABLET BY MOUTH EVERY DAY 90 tablet 3   pravastatin (PRAVACHOL) 20 MG tablet Take 1 tablet (20 mg total) by mouth daily. After 6 pm 90 tablet 3   triamterene-hydrochlorothiazide (MAXZIDE-25) 37.5-25 MG tablet Take 1 tablet by mouth daily. In am 90 tablet 3   TURMERIC PO Take 500 mg by mouth daily. With ginger      No current facility-administered medications for this visit.    Allergies as of 06/25/2021 - Review Complete 06/25/2021  Allergen Reaction Noted   Latex Itching 05/13/2018    ROS:  General: Negative for anorexia, weight loss, fever, chills, fatigue, weakness. ENT: Negative for hoarseness, difficulty swallowing , nasal congestion. CV: Negative for chest pain, angina, palpitations, dyspnea on exertion, peripheral edema.  Respiratory: Negative for dyspnea at rest, dyspnea on exertion, cough, sputum, wheezing.  GI: See history of present illness. GU:  Negative for dysuria, hematuria, urinary incontinence, urinary frequency, nocturnal urination.  Endo: Negative for unusual weight change.  Physical Examination:   BP 129/81 (BP Location: Right Arm, Patient Position: Sitting, Cuff Size: Large)   Pulse (!) 105   Ht _0  (1.676 m)   Wt 237 lb 9.6 oz (107.8 kg)   BMI 38.35 kg/m   General: Well-nourished, well-developed in no acute distress.  Eyes: No icterus. Conjunctivae pink. Lungs: Clear to auscultation bilaterally. Non-labored. Heart: Regular rate and rhythm, no murmurs rubs or gallops.  Abdomen: Bowel sounds are normal, nontender, nondistended, no hepatosplenomegaly or masses, no abdominal bruits or hernia , no rebound or guarding.   Extremities: No lower extremity edema. No clubbing or deformities. Neuro: Alert and oriented x 3.  Grossly intact. Skin: Warm and dry, no jaundice.    Psych: Alert and cooperative, normal mood and affect.  Labs:    Imaging Studies: No results found.  Assessment and Plan:   Kathleen Burke is a 45 y.o. y/o female who comes in with a change in bowel habits with greasy and oily stools.  The patient has tried to modify her diet but still continues to have these symptoms. The patient has been told that due to her symptoms pancreatic insufficiency versus small intestinal bacterial overgrowth may be the cause of her symptoms and she will have her stools checked for fecal fat and fecal elastase. If the stools show a normal fecal elastase with increased fecal fat then the patient may need a small intestinal bacterial overgrowth breath test.  The patient has been explained the plan and agrees with it.     [name], MD. Mar[facility] Regal    Note: This dictation was prepared with Dragon dictation along with smaller phrase technology. Any transcriptional errors that result from this process are unintentional.

## 2021-06-27 LAB — FECAL FAT, QUALITATIVE
Fat Qual Neutral, Stl: NORMAL
Fat Qual Total, Stl: NORMAL

## 2021-07-19 ENCOUNTER — Telehealth: Payer: Self-pay

## 2021-07-19 NOTE — Telephone Encounter (Signed)
-----   Message from Midge Minium, MD sent at 07/16/2021  9:39 PM EDT ----- Please inform the patient that the stool studies did not show any sign of increased fat in the stool suggestive of malabsorption.

## 2021-07-19 NOTE — Telephone Encounter (Signed)
Pt notified of lab result. 

## 2021-08-07 ENCOUNTER — Other Ambulatory Visit: Payer: Self-pay

## 2021-08-07 ENCOUNTER — Ambulatory Visit
Admission: RE | Admit: 2021-08-07 | Discharge: 2021-08-07 | Disposition: A | Discharge status: Home / Self Care | Payer: BC Managed Care – PPO | Source: Ambulatory Visit | Attending: Internal Medicine | Admitting: Internal Medicine

## 2021-08-07 DIAGNOSIS — Z1231 Encounter for screening mammogram for malignant neoplasm of breast: Secondary | ICD-10-CM

## 2021-09-05 ENCOUNTER — Telehealth: Payer: Self-pay

## 2021-09-05 NOTE — Telephone Encounter (Signed)
Confirmed signed and faxed [facility]. Form has been sent to scan.

## 2021-10-11 ENCOUNTER — Encounter: Payer: Self-pay | Admitting: Internal Medicine

## 2021-10-11 ENCOUNTER — Ambulatory Visit (INDEPENDENT_AMBULATORY_CARE_PROVIDER_SITE_OTHER): Payer: BC Managed Care – PPO | Admitting: Internal Medicine

## 2021-10-11 ENCOUNTER — Other Ambulatory Visit: Payer: Self-pay

## 2021-10-11 VITALS — BP 122/78 | HR 96 | Temp 98.1°F | Ht 66.0 in | Wt 241.2 lb

## 2021-10-11 DIAGNOSIS — I10 Essential (primary) hypertension: Secondary | ICD-10-CM | POA: Diagnosis not present

## 2021-10-11 DIAGNOSIS — F40240 Claustrophobia: Secondary | ICD-10-CM

## 2021-10-11 DIAGNOSIS — E559 Vitamin D deficiency, unspecified: Secondary | ICD-10-CM

## 2021-10-11 DIAGNOSIS — R000 Tachycardia, unspecified: Secondary | ICD-10-CM

## 2021-10-11 DIAGNOSIS — R7989 Other specified abnormal findings of blood chemistry: Secondary | ICD-10-CM

## 2021-10-11 DIAGNOSIS — I152 Hypertension secondary to endocrine disorders: Secondary | ICD-10-CM

## 2021-10-11 DIAGNOSIS — R7303 Prediabetes: Secondary | ICD-10-CM

## 2021-10-11 DIAGNOSIS — E1159 Type 2 diabetes mellitus with other circulatory complications: Secondary | ICD-10-CM

## 2021-10-11 LAB — COMPREHENSIVE METABOLIC PANEL
ALT: 20 U/L (ref 0–35)
AST: 19 U/L (ref 0–37)
Albumin: 4.5 g/dL (ref 3.5–5.2)
Alkaline Phosphatase: 75 U/L (ref 39–117)
BUN: 13 mg/dL (ref 6–23)
CO2: 29 mEq/L (ref 19–32)
Calcium: 9.9 mg/dL (ref 8.4–10.5)
Chloride: 99 mEq/L (ref 96–112)
Creatinine, Ser: 0.8 mg/dL (ref 0.40–1.20)
GFR: 87.67 mL/min (ref >60.00)
Glucose, Bld: 96 mg/dL (ref 70–99)
Potassium: 3.6 mEq/L (ref 3.5–5.1)
Sodium: 138 mEq/L (ref 135–145)
Total Bilirubin: 0.4 mg/dL (ref 0.2–1.2)
Total Protein: 8.1 g/dL (ref 6.0–8.3)

## 2021-10-11 LAB — LIPID PANEL
Cholesterol: 182 mg/dL (ref 0–200)
HDL: 71.1 mg/dL (ref >39.00)
LDL Cholesterol: 101 mg/dL — ABNORMAL HIGH (ref 0–99)
NonHDL: 110.89
Total CHOL/HDL Ratio: 3
Triglycerides: 47 mg/dL (ref 0.0–149.0)
VLDL: 9.4 mg/dL (ref 0.0–40.0)

## 2021-10-11 LAB — HEMOGLOBIN A1C: Hgb A1c MFr Bld: 6.5 % (ref 4.6–6.5)

## 2021-10-11 LAB — CBC WITH DIFFERENTIAL/PLATELET
Basophils Absolute: 0 10*3/uL (ref 0.0–0.1)
Basophils Relative: 0.5 % (ref 0.0–3.0)
Eosinophils Absolute: 0.1 10*3/uL (ref 0.0–0.7)
Eosinophils Relative: 2.1 % (ref 0.0–5.0)
HCT: 35.9 % — ABNORMAL LOW (ref 36.0–46.0)
Hemoglobin: 11.8 g/dL — ABNORMAL LOW (ref 12.0–15.0)
Lymphocytes Relative: 29.8 % (ref 12.0–46.0)
Lymphs Abs: 2 10*3/uL (ref 0.7–4.0)
MCHC: 32.9 g/dL (ref 30.0–36.0)
MCV: 89 fl (ref 78.0–100.0)
Monocytes Absolute: 0.5 10*3/uL (ref 0.1–1.0)
Monocytes Relative: 7 % (ref 3.0–12.0)
Neutro Abs: 4 10*3/uL (ref 1.4–7.7)
Neutrophils Relative %: 60.6 % (ref 43.0–77.0)
Platelets: 391 10*3/uL (ref 150.0–400.0)
RBC: 4.03 Mil/uL (ref 3.87–5.11)
RDW: 14.1 % (ref 11.5–15.5)
WBC: 6.6 10*3/uL (ref 4.0–10.5)

## 2021-10-11 LAB — VITAMIN D 25 HYDROXY (VIT D DEFICIENCY, FRACTURES): VITD: 44.97 ng/mL (ref 30.00–100.00)

## 2021-10-11 MED ORDER — ALPRAZOLAM 0.5 MG PO TABS
0.5000 mg | ORAL_TABLET | Freq: Every day | ORAL | 0 refills | Status: DC | PRN
Start: 2021-10-11 — End: 2022-04-11

## 2021-10-11 MED ORDER — METOPROLOL SUCCINATE ER 25 MG PO TB24: 25.0000 mg | ORAL_TABLET | Freq: Every day | ORAL | 3 refills | Status: DC

## 2021-10-11 NOTE — Progress Notes (Signed)
Chief Complaint  Patient presents with   Follow-up   F/u  1. Htn controlled on toprol xl 25 mg qd, benicar 20 mg qd and maxzide 37.5-25 pravachol 20 mg qhs for hld  2. Prediabetes    Review of Systems  Constitutional:  Negative for weight loss.  HENT:  Negative for hearing loss.   Eyes:  Negative for blurred vision.  Respiratory:  Negative for shortness of breath.   Cardiovascular:  Negative for chest pain.  Gastrointestinal:  Negative for abdominal pain and blood in stool.  Genitourinary:  Negative for dysuria.  Musculoskeletal:  Negative for falls and joint pain.  Skin:  Negative for rash.  Neurological:  Negative for headaches.  Psychiatric/Behavioral:  Negative for depression. The patient is nervous/anxious.   Past Medical History:  Diagnosis Date   Anemia    Anxiety 2014   Arthritis    right arm, right knee   Chicken pox    Depression    Frequent headaches    GERD (gastroesophageal reflux disease)    Hyperlipemia    Hypertension    Obesity (BMI 30-39.9)    Pre-diabetes    Sleep apnea    CPAP   Trigger finger    MIDDLE FINGER RIGHT HAND SURGERY ON JULY 31ST 2019   Past Surgical History:  Procedure Laterality Date   COLONOSCOPY WITH PROPOFOL N/A 07/18/2019   Procedure: COLONOSCOPY WITH PROPOFOL;  Surgeon: [name], MD;  Location: [facility];  Service: Endoscopy;  Laterality: N/A;  Latex Sleep apnea   HYSTEROSCOPY WITH D & C N/A 05/20/2018   Procedure: DILATATION AND CURETTAGE /HYSTEROSCOPY;  Surgeon: [name], MD;  Location: [facility];  Service: Gynecology;  Laterality: N/A;   HYSTEROSCOPY WITH D & C N/A 10/25/2020   Procedure: DILATATION AND CURETTAGE /HYSTEROSCOPY;  Surgeon: [name], MD;  Location: [facility];  Service: Gynecology;  Laterality: N/A;   INTRAUTERINE DEVICE (IUD) INSERTION N/A 05/20/2018   Procedure: INTRAUTERINE DEVICE (IUD) INSERTION;  Surgeon: [name], MD;  Location: [facility];  Service: Gynecology;   Laterality: N/A;   INTRAUTERINE DEVICE (IUD) INSERTION  10/25/2020   Procedure: INTRAUTERINE DEVICE (IUD) INSERTION;  Surgeon: [name], MD;  Location: [facility];  Service: Gynecology;;   WISDOM TOOTH EXTRACTION     Family History  Problem Relation Age of Onset   Arthritis Mother    Hyperlipidemia Mother    Hypertension Mother    Diabetes Mellitus I Father        DM I   Breast cancer Maternal Grandmother 64   Arthritis Paternal Grandmother    Diabetes Mellitus II Paternal Grandmother    Arthritis Paternal Grandfather    Hyperlipidemia Paternal Grandfather    Stroke Paternal Grandfather    Hypertension Paternal Grandfather    Diabetes Mellitus II Sister    Diabetes Mellitus II Sister    Hyperlipidemia Sister    Hypertension Sister    Social History   Socioeconomic History   Marital status: Married    Spouse name: Not on file   Number of children: 2   Years of education: Not on file   Highest education level: Not on file  Occupational History   Occupation: RN  Tobacco Use   Smoking status: Never   Smokeless tobacco: Never  Vaping Use   Vaping Use: Never used  Substance and Sexual Activity   Alcohol use: Yes    Alcohol/week: 2.0 standard drinks    Types: 2 Glasses of wine per week    Comment:  OCC-WEEKENDS   Drug use: No   Sexual activity: Yes    Partners: Male    Birth control/protection: Surgical    Comment: vasectomy  Other Topics Concern   Not on file  Social History Narrative   Married    2 kids    Works occup[employer]    Social Determinants of Health   Financial Resource Strain: Not on file  Food Insecurity: Not on file  Transportation Needs: Not on file  Physical Activity: Not on file  Stress: Not on file  Social Connections: Not on file  Intimate Partner Violence: Not on file   Current Meds  Medication Sig   Accu-Chek FastClix Lancets MISC USE AS DIRECTED ONCE A DAY   ACCU-CHEK GUIDE test strip USE AS DIRECTED TO CHECK SUGAR ONCE A  DAY   ALPRAZolam (XANAX) 0.5 MG tablet Take 1 tablet (0.5 mg total) by mouth daily as needed for anxiety.   b complex vitamins capsule Take 1 capsule by mouth daily.   blood glucose meter kit and supplies Dispense based on patient and insurance preference. Use up to four times daily as directed. (FOR ICD-10 E10.9, E11.9).   Cholecalciferol (VITAMIN D3) 125 MCG (5000 UT) CAPS Take 5,000 Units by mouth daily.   Cyanocobalamin (B-12) 1000 MCG TABS Take 1,000 mcg by mouth daily.   docusate sodium (COLACE) 100 MG capsule Take 100 mg by mouth daily.    ELDERBERRY PO Take 1 each by mouth 2 (two) times daily. Gummies   ferrous sulfate 325 (65 FE) MG tablet Take 325 mg by mouth daily with breakfast.    levonorgestrel (MIRENA, 52 MG,) 20 MCG/DAY IUD 1 each by Intrauterine route once.   olmesartan (BENICAR) 20 MG tablet TAKE 1 TABLET BY MOUTH EVERY DAY   pravastatin (PRAVACHOL) 20 MG tablet Take 1 tablet (20 mg total) by mouth daily. After 6 pm   triamterene-hydrochlorothiazide (MAXZIDE-25) 37.5-25 MG tablet Take 1 tablet by mouth daily. In am   TURMERIC PO Take 500 mg by mouth daily. With ginger    [DISCONTINUED] metoprolol succinate (TOPROL-XL) 25 MG 24 hr tablet Take 1 tablet (25 mg total) by mouth daily.   Allergies  Allergen Reactions   Latex Itching    GLOVES (extended exposure)   No results found for this or any previous visit (from the past 2160 hour(s)). Objective  Body mass index is 38.93 kg/m. Wt Readings from Last 3 Encounters:  10/11/21 241 lb 3.2 oz (109.4 kg)  06/25/21 237 lb 9.6 oz (107.8 kg)  05/17/21 236 lb (107 kg)   Temp Readings from Last 3 Encounters:  10/11/21 98.1 F (36.7 C) (Temporal)  04/05/21 97.8 F (36.6 C)  03/19/21 98.6 F (37 C) (Oral)   BP Readings from Last 3 Encounters:  10/11/21 122/78  06/25/21 129/81  05/17/21 120/76   Pulse Readings from Last 3 Encounters:  10/11/21 96  06/25/21 (!) 105  05/17/21 80    Physical Exam Vitals and nursing  note reviewed.  Constitutional:      Appearance: Normal appearance. She is well-developed and well-groomed.  HENT:     Head: Normocephalic and atraumatic.  Eyes:     Conjunctiva/sclera: Conjunctivae normal.     Pupils: Pupils are equal, round, and reactive to light.  Cardiovascular:     Rate and Rhythm: Normal rate and regular rhythm.     Heart sounds: Normal heart sounds. No murmur heard. Pulmonary:     Effort: Pulmonary effort is normal.  Breath sounds: Normal breath sounds.  Abdominal:     General: Abdomen is flat. Bowel sounds are normal.     Tenderness: There is no abdominal tenderness.  Musculoskeletal:        General: No tenderness.  Skin:    General: Skin is warm and dry.  Neurological:     General: No focal deficit present.     Mental Status: She is alert and oriented to person, place, and time. Mental status is at baseline.     Cranial Nerves: Cranial nerves 2-12 are intact.     Gait: Gait is intact.  Psychiatric:        Attention and Perception: Attention and perception normal.        Mood and Affect: Mood and affect normal.        Speech: Speech normal.        Behavior: Behavior normal. Behavior is cooperative.        Thought Content: Thought content normal.        Cognition and Memory: Cognition and memory normal.        Judgment: Judgment normal.    Assessment  Plan  Primary hypertension - Plan: Comprehensive metabolic panel, Lipid panel, CBC with Differential/Platelet toprol xl 25 mg qd, benicar 20 mg qd and maxzide 37.5-25 pravachol 20 mg qhs for hld   Low vitamin D level  Prediabetes - Plan: Hemoglobin A1c  Vitamin D deficiency - Plan: Vitamin D (25 hydroxy)  Claustrophobia - Plan: ALPRAZolam (XANAX) 0.5 MG tablet qd prn going on cruise   HM Flu shot utd  utd tdap had at work in 2021 check date for me 08/20/20  Immune hep B and MMR  covid 2/2 had and pfizer 2/2   Consider prevnar, pna 23    Pap [facility] 03/23/18 neg no HPV testing  done Endometrial Bx 05/20/18 negative with IUD now  Has IUD DUB 10/25/20 uterine fibroids x 2, IUD out, left 3.8 cyst   mammo 08/07/21 negative    Colonoscopy 07/18/2019 normal [facility]ohl repeat in 10 years    Declines HIV testing  rec healthy diet and exercise  rec take D3 2000 IUD daily   Ortho seeing for shoulder/ arm pain tried steroid injection, massage helped consider MRI in the past     Provider: Dr. Olivia Mackie [name]-Internal Medicine

## 2021-10-11 NOTE — Patient Instructions (Addendum)
Call back nurse visit for prevnar  Call [facility] to schedule pap smear  Aspercream with lidocaine for your shoulder  Lidocaine pain patches    Semaglutide Injection What is this medication? SEMAGLUTIDE (SEM a GLOO tide) treats type 2 diabetes. It works by increasing insulin levels in your body, which decreases your blood sugar (glucose). It also reduces the amount of sugar released into the blood and slows down your digestion. It can also be used to lower the risk of heart attack and stroke in people with type 2 diabetes. Changes to diet and exercise are often combined with this medication. This medicine may be used for other purposes; ask your health care provider or pharmacist if you have questions. COMMON BRAND NAME(S): OZEMPIC What should I tell my care team before I take this medication? They need to know if you have any of these conditions: Endocrine tumors (MEN 2) or if someone in your family had these tumors Eye disease, vision problems History of pancreatitis Kidney disease Stomach problems Thyroid cancer or if someone in your family had thyroid cancer An unusual or allergic reaction to semaglutide, other medications, foods, dyes, or preservatives Pregnant or trying to get pregnant Breast-feeding How should I use this medication? This medication is for injection under the skin of your upper leg (thigh), stomach area, or upper arm. It is given once every week (every 7 days). You will be taught how to prepare and give this medication. Use exactly as directed. Take your medication at regular intervals. Do not take it more often than directed. If you use this medication with insulin, you should inject this medication and the insulin separately. Do not mix them together. Do not give the injections right next to each other. Change (rotate) injection sites with each injection. It is important that you put your used needles and syringes in a special sharps container. Do not put them in a  trash can. If you do not have a sharps container, call your pharmacist or care team to get one. A special MedGuide will be given to you by the pharmacist with each prescription and refill. Be sure to read this information carefully each time. This medication comes with INSTRUCTIONS FOR USE. Ask your pharmacist for directions on how to use this medication. Read the information carefully. Talk to your pharmacist or care team if you have questions. Talk to your care team about the use of this medication in children. Special care may be needed. Overdosage: If you think you have taken too much of this medicine contact a poison control center or emergency room at once. NOTE: This medicine is only for you. Do not share this medicine with others. What if I miss a dose? If you miss a dose, take it as soon as you can within 5 days after the missed dose. Then take your next dose at your regular weekly time. If it has been longer than 5 days after the missed dose, do not take the missed dose. Take the next dose at your regular time. Do not take double or extra doses. If you have questions about a missed dose, contact your care team for advice. What may interact with this medication? Other medications for diabetes Many medications may cause changes in blood sugar, these include: Alcohol containing beverages Antiviral medications for HIV or AIDS Aspirin and aspirin-like medications Certain medications for blood pressure, heart disease, irregular heart beat Chromium Diuretics Female hormones, such as estrogens or progestins, birth control pills Fenofibrate Gemfibrozil Isoniazid Lanreotide  Female hormones or anabolic steroids MAOIs like Carbex, Eldepryl, Marplan, Nardil, and Parnate Medications for weight loss Medications for allergies, asthma, cold, or cough Medications for depression, anxiety, or psychotic disturbances Niacin Nicotine NSAIDs, medications for pain and inflammation, like ibuprofen or  naproxen Octreotide Pasireotide Pentamidine Phenytoin Probenecid Quinolone antibiotics such as ciprofloxacin, levofloxacin, ofloxacin Some herbal dietary supplements Steroid medications such as prednisone or cortisone Sulfamethoxazole; trimethoprim Thyroid hormones Some medications can hide the warning symptoms of low blood sugar (hypoglycemia). You may need to monitor your blood sugar more closely if you are taking one of these medications. These include: Beta-blockers, often used for high blood pressure or heart problems (examples include atenolol, metoprolol, propranolol) Clonidine Guanethidine Reserpine This list may not describe all possible interactions. Give your health care provider a list of all the medicines, herbs, non-prescription drugs, or dietary supplements you use. Also tell them if you smoke, drink alcohol, or use illegal drugs. Some items may interact with your medicine. What should I watch for while using this medication? Visit your care team for regular checks on your progress. Drink plenty of fluids while taking this medication. Check with your care team if you get an attack of severe diarrhea, nausea, and vomiting. The loss of too much body fluid can make it dangerous for you to take this medication. A test called the HbA1C (A1C) will be monitored. This is a simple blood test. It measures your blood sugar control over the last 2 to 3 months. You will receive this test every 3 to 6 months. Learn how to check your blood sugar. Learn the symptoms of low and high blood sugar and how to manage them. Always carry a quick-source of sugar with you in case you have symptoms of low blood sugar. Examples include hard sugar candy or glucose tablets. Make sure others know that you can choke if you eat or drink when you develop serious symptoms of low blood sugar, such as seizures or unconsciousness. They must get medical help at once. Tell your care team if you have high blood sugar.  You might need to change the dose of your medication. If you are sick or exercising more than usual, you might need to change the dose of your medication. Do not skip meals. Ask your care team if you should avoid alcohol. Many nonprescription cough and cold products contain sugar or alcohol. These can affect blood sugar. Pens should never be shared. Even if the needle is changed, sharing may result in passing of viruses like hepatitis or HIV. Wear a medical ID bracelet or chain, and carry a card that describes your disease and details of your medication and dosage times. Do not become pregnant while taking this medication. Women should inform their care team if they wish to become pregnant or think they might be pregnant. There is a potential for serious side effects to an unborn child. Talk to your care team for more information. What side effects may I notice from receiving this medication? Side effects that you should report to your care team as soon as possible: Allergic reactions--skin rash, itching, hives, swelling of the face, lips, tongue, or throat Change in vision Dehydration--increased thirst, dry mouth, feeling faint or lightheaded, headache, dark yellow or brown urine Gallbladder problems--severe stomach pain, nausea, vomiting, fever Heart palpitations--rapid, pounding, or irregular heartbeat Kidney injury--decrease in the amount of urine, swelling of the ankles, hands, or feet Pancreatitis--severe stomach pain that spreads to your back or gets worse after eating or when  touched, fever, nausea, vomiting Thyroid cancer--new mass or lump in the neck, pain or trouble swallowing, trouble breathing, hoarseness Side effects that usually do not require medical attention (report to your care team if they continue or are bothersome): Diarrhea Loss of appetite Nausea Stomach pain Vomiting This list may not describe all possible side effects. Call your doctor for medical advice about side  effects. You may report side effects to FDA at 1-800-FDA-1088. Where should I keep my medication? Keep out of the reach of children. Store unopened pens in a refrigerator between 2 and 8 degrees C (36 and 46 degrees F). Do not freeze. Protect from light and heat. After you first use the pen, it can be stored for 56 days at room temperature between 15 and 30 degrees C (59 and 86 degrees F) or in a refrigerator. Throw away your used pen after 56 days or after the expiration date, whichever comes first. Do not store your pen with the needle attached. If the needle is left on, medication may leak from the pen. NOTE: This sheet is a summary. It may not cover all possible information. If you have questions about this medicine, talk to your doctor, pharmacist, or health care provider.  2022 Elsevier/Gold Standard (2021-01-17 00:00:00)  Hypoglycemia Hypoglycemia occurs when the level of sugar (glucose) in the blood is too low. Hypoglycemia can happen in people who have or do not have diabetes. It can develop quickly, and it can be a medical emergency. For most people, a blood glucose level below 70 mg/dL (3.9 mmol/L) is considered hypoglycemia. Glucose is a type of sugar that provides the body's main source of energy. Certain hormones (insulin and glucagon) control the level of glucose in the blood. Insulin lowers blood glucose, and glucagon raises blood glucose. Hypoglycemia can result from having too much insulin in the bloodstream, or from not eating enough food that contains glucose. You may also have reactive hypoglycemia, which happens within 4 hours after eating a meal. What are the causes? Hypoglycemia occurs most often in people who have diabetes and may be caused by: Diabetes medicine. Not eating enough, or not eating often enough. Increased physical activity. Drinking alcohol on an empty stomach. If you do not have diabetes, hypoglycemia may be caused by: A tumor in the pancreas. Not eating  enough, or not eating for long periods at a time (fasting). A severe infection or illness. Problems after having bariatric surgery. Organ failure, such as kidney or liver failure. Certain medicines. What increases the risk? Hypoglycemia is more likely to develop in people who: Have diabetes and take medicines to lower blood glucose. Abuse alcohol. Have a severe illness. What are the signs or symptoms? Symptoms vary depending on whether the condition is mild, moderate, or severe. Mild hypoglycemia Hunger. Sweating and feeling clammy. Dizziness or feeling light-headed. Sleepiness or restless sleep. Nausea. Increased heart rate. Headache. Blurry vision. Mood changes, such as irritability or anxiety. Tingling or numbness around the mouth, lips, or tongue. Moderate hypoglycemia Confusion and poor judgment. Behavior changes. Weakness. Irregular heartbeat. A change in coordination. Severe hypoglycemia Severe hypoglycemia is a medical emergency. It can cause: Fainting. Seizures. Loss of consciousness (coma). Death. How is this diagnosed? Hypoglycemia is diagnosed with a blood test to measure your blood glucose level. This blood test is done while you are having symptoms. Your health care provider may also do a physical exam and review your medical history. How is this treated? This condition can be treated by immediately eating or  drinking something that contains sugar with 15 grams of fast-acting carbohydrate, such as: 4 oz (120 mL) of fruit juice. 4 oz (120 mL) of regular soda (not diet soda). Several pieces of hard candy. Check food labels to find out how many pieces to eat for 15 grams. 1 Tbsp (15 mL) of sugar or honey. 4 glucose tablets. 1 tube of glucose gel. Treating hypoglycemia if you have diabetes If you are alert and able to swallow safely, follow the 15:15 rule: Take 15 grams of a fast-acting carbohydrate. Talk with your health care provider about how much you  should take. Options for getting 15 grams of fast-acting carbohydrate include: Glucose tablets (take 4 tablets). Several pieces of hard candy. Check food labels to find out how many pieces to eat for 15 grams. 4 oz (120 mL) of fruit juice. 4 oz (120 mL) of regular soda (not diet soda). 1 Tbsp (15 mL) of sugar or honey. 1 tube of glucose gel. Check your blood glucose 15 minutes after you take the carbohydrate. If the repeat blood glucose level is still at or below 70 mg/dL (3.9 mmol/L), take 15 grams of a carbohydrate again. If your blood glucose level does not increase above 70 mg/dL (3.9 mmol/L) after 3 tries, seek emergency medical care. After your blood glucose level returns to normal, eat a meal or a snack within 1 hour.  Treating severe hypoglycemia Severe hypoglycemia is when your blood glucose level is below 54 mg/dL (3 mmol/L). Severe hypoglycemia is a medical emergency. Get medical help right away. If you have severe hypoglycemia and you cannot eat or drink, you will need to be given glucagon. A family member or close friend should learn how to check your blood glucose and how to give you glucagon. Ask your health care provider if you need to have an emergency glucagon kit available. Severe hypoglycemia may need to be treated in a hospital. The treatment may include getting glucose through an IV. You may also need treatment for the cause of your hypoglycemia. Follow these instructions at home: General instructions Take over-the-counter and prescription medicines only as told by your health care provider. Monitor your blood glucose as told by your health care provider. If you drink alcohol: Limit how much you have to: 0-1 drink a day for women who are not pregnant. 0-2 drinks a day for men. Know how much alcohol is in your drink. In the [place] one drink equals one 12 oz bottle of beer (355 mL), one 5 oz glass of wine (148 mL), or one 1 oz glass of hard liquor (44 mL). Be sure to eat  food along with drinking alcohol. Be aware that alcohol is absorbed quickly and may have lingering effects that may result in hypoglycemia later. Be sure to do ongoing glucose monitoring. Keep all follow-up visits. This is important. If you have diabetes: Always have a fast-acting carbohydrate (15 grams) option with you to treat low blood glucose. Follow your diabetes management plan as directed by your health care provider. Make sure you: Know the symptoms of hypoglycemia. It is important to treat it right away to prevent it from becoming severe. Check your blood glucose as often as told. Always check before and after exercise. Always check your blood glucose before you drive a motorized vehicle. Take your medicines as told. Follow your meal plan. Eat on time, and do not skip meals. Share your diabetes management plan with people in your workplace, school, and household. Carry a medical alert  card or wear medical alert jewelry. Where to find more information American Diabetes Association: www.diabetes.org Contact a health care provider if: You have problems keeping your blood glucose in your target range. You have frequent episodes of hypoglycemia. Get help right away if: You continue to have hypoglycemia symptoms after eating or drinking something that contains 15 grams of fast-acting carbohydrate, and you cannot get your blood glucose above 70 mg/dL (3.9 mmol/L) while following the 15:15 rule. Your blood glucose is below 54 mg/dL (3 mmol/L). You have a seizure. You faint. These symptoms may represent a serious problem that is an emergency. Do not wait to see if the symptoms will go away. Get medical help right away. Call your local emergency services (911 in the [place] Do not drive yourself to the hospital. Summary Hypoglycemia occurs when the level of sugar (glucose) in the blood is too low. Hypoglycemia can happen in people who have or do not have diabetes. It can develop quickly, and  it can be a medical emergency. Make sure you know the symptoms of hypoglycemia and how to treat it. Always have a fast-acting carbohydrate option with you to treat low blood sugar. This information is not intended to replace advice given to you by your health care provider. Make sure you discuss any questions you have with your health care provider. Document Revised: 09/13/2020 Document Reviewed: 09/13/2020 Elsevier Patient Education  [place].   Pneumococcal Conjugate Vaccine (Prevnar 13) Suspension for Injection What is this medication? PNEUMOCOCCAL VACCINE (NEU mo KOK al vak SEEN) is a vaccine used to prevent pneumococcus bacterial infections. These bacteria can cause serious infections like pneumonia, meningitis, and blood infections. This vaccine will lower your chance of getting pneumonia. If you do get pneumonia, it can make your symptoms milder and your illness shorter. This vaccine will not treat an infection and will not cause infection. This vaccine is recommended for infants and young children, adults with certain medical conditions, and adults 7 years or older. This medicine may be used for other purposes; ask your health care provider or pharmacist if you have questions. COMMON BRAND NAME(S): Prevnar, Prevnar 13 What should I tell my care team before I take this medication? They need to know if you have any of these conditions: bleeding problems fever immune system problems an unusual or allergic reaction to pneumococcal vaccine, diphtheria toxoid, other vaccines, latex, other medicines, foods, dyes, or preservatives pregnant or trying to get pregnant breast-feeding How should I use this medication? This vaccine is for injection into a muscle. It is given by a health care professional. A copy of Vaccine Information Statements will be given before each vaccination. Read this sheet carefully each time. The sheet may change frequently. Talk to your pediatrician regarding  the use of this medicine in children. While this drug may be prescribed for children as young as 9 weeks old for selected conditions, precautions do apply. Overdosage: If you think you have taken too much of this medicine contact a poison control center or emergency room at once. NOTE: This medicine is only for you. Do not share this medicine with others. What if I miss a dose? It is important not to miss your dose. Call your doctor or health care professional if you are unable to keep an appointment. What may interact with this medication? medicines for cancer chemotherapy medicines that suppress your immune function steroid medicines like prednisone or cortisone This list may not describe all possible interactions. Give your health care provider  a list of all the medicines, herbs, non-prescription drugs, or dietary supplements you use. Also tell them if you smoke, drink alcohol, or use illegal drugs. Some items may interact with your medicine. What should I watch for while using this medication? Mild fever and pain should go away in 3 days or less. Report any unusual symptoms to your doctor or health care professional. What side effects may I notice from receiving this medication? Side effects that you should report to your doctor or health care professional as soon as possible: allergic reactions like skin rash, itching or hives, swelling of the face, lips, or tongue breathing problems confused fast or irregular heartbeat fever over 102 degrees F seizures unusual bleeding or bruising unusual muscle weakness Side effects that usually do not require medical attention (report to your doctor or health care professional if they continue or are bothersome): aches and pains diarrhea fever of 102 degrees F or less headache irritable loss of appetite pain, tender at site where injected trouble sleeping This list may not describe all possible side effects. Call your doctor for medical advice  about side effects. You may report side effects to FDA at 1-800-FDA-1088. Where should I keep my medication? This does not apply. This vaccine is given in a clinic, pharmacy, doctor's office, or other health care setting and will not be stored at home. NOTE: This sheet is a summary. It may not cover all possible information. If you have questions about this medicine, talk to your doctor, pharmacist, or health care provider.  2022 Elsevier/Gold Standard (2014-07-20 00:00:00)

## 2021-10-17 ENCOUNTER — Encounter: Payer: Self-pay | Admitting: Internal Medicine

## 2021-10-17 MED ORDER — OZEMPIC (0.25 OR 0.5 MG/DOSE) 2 MG/1.5ML ~~LOC~~ SOPN: 0.2500 mg | PEN_INJECTOR | SUBCUTANEOUS | 2 refills | Status: DC

## 2021-10-17 NOTE — Addendum Note (Signed)
Addended by: Quentin Ore on: 10/17/2021 06:07 PM   Modules accepted: Orders

## 2021-11-26 ENCOUNTER — Telehealth: Payer: Self-pay | Admitting: Internal Medicine

## 2021-11-26 NOTE — Telephone Encounter (Signed)
According to 09/2021 office note: Consider prevnar, pna 23.  Added to the appointment note

## 2021-11-26 NOTE — Telephone Encounter (Signed)
Pt called in to schedule an appt for pneumonia vaccine. Pt is schedule for 11/29/2021 at 9:15am.

## 2021-11-29 ENCOUNTER — Ambulatory Visit (INDEPENDENT_AMBULATORY_CARE_PROVIDER_SITE_OTHER): Payer: BC Managed Care – PPO | Admitting: *Deleted

## 2021-11-29 ENCOUNTER — Other Ambulatory Visit: Payer: Self-pay

## 2021-11-29 DIAGNOSIS — Z23 Encounter for immunization: Secondary | ICD-10-CM

## 2021-12-25 ENCOUNTER — Telehealth: Payer: Self-pay | Admitting: Internal Medicine

## 2021-12-25 NOTE — Telephone Encounter (Signed)
Patient is having a hard time getting her Semaglutide,0.25 or 0.5MG /DOS, (OZEMPIC, 0.25 OR 0.5 MG/DOSE,) 2 MG/1.5ML SOPN. Her insurance told patient it needs to be written as a maintenance medication and a 90 day supply. Patient said office needs to call this number for refill [phone]. ?

## 2021-12-30 NOTE — Telephone Encounter (Signed)
Patient's insurance will not cover a 3 month supply of the Ozempic. 0.25 once weekly was sent in. Are you wanting the Patient to stay on the 0.235 or will she be triturating up? ?

## 2021-12-30 NOTE — Telephone Encounter (Signed)
Pt called in stating that medication (Semaglutide,0.25 or 0.5MG /DOS, (OZEMPIC, 0.25 OR 0.5 MG/DOSE,) 2 MG/1.5ML SOPN) hasn't been sent over to her pharmacy for the 90 day or 6 months supply so that she can get it cheaper through her insurance. Pt requesting script to be sent over. Pt requesting callback  ?

## 2021-12-31 ENCOUNTER — Encounter: Payer: Self-pay | Admitting: Internal Medicine

## 2021-12-31 NOTE — Telephone Encounter (Signed)
Why am I just seeing this message 6 days later?  ?Does she want to increase to 0.5 ozempic if so send in 90 day supply to pharmacy ozempic and can call back in 1 month for 1 mg if tolerating  ? ?Apologize to pt for delay please  ? ? ? ? ?

## 2022-01-01 ENCOUNTER — Other Ambulatory Visit: Payer: Self-pay | Admitting: Internal Medicine

## 2022-01-01 DIAGNOSIS — E1159 Type 2 diabetes mellitus with other circulatory complications: Secondary | ICD-10-CM

## 2022-01-01 DIAGNOSIS — I152 Hypertension secondary to endocrine disorders: Secondary | ICD-10-CM

## 2022-01-01 MED ORDER — OZEMPIC (0.25 OR 0.5 MG/DOSE) 2 MG/1.5ML ~~LOC~~ SOPN: 0.5000 mg | PEN_INJECTOR | SUBCUTANEOUS | 3 refills | Status: DC

## 2022-01-02 NOTE — Telephone Encounter (Addendum)
(  0 day supply has been sent at 0.5 mg dose. ?

## 2022-02-08 ENCOUNTER — Other Ambulatory Visit: Payer: Self-pay | Admitting: Internal Medicine

## 2022-02-08 DIAGNOSIS — I10 Essential (primary) hypertension: Secondary | ICD-10-CM

## 2022-02-09 ENCOUNTER — Other Ambulatory Visit: Payer: Self-pay | Admitting: Internal Medicine

## 2022-02-09 DIAGNOSIS — I10 Essential (primary) hypertension: Secondary | ICD-10-CM

## 2022-03-20 ENCOUNTER — Other Ambulatory Visit: Payer: Self-pay | Admitting: Internal Medicine

## 2022-03-20 ENCOUNTER — Other Ambulatory Visit: Payer: Self-pay

## 2022-03-20 ENCOUNTER — Encounter: Payer: Self-pay | Admitting: Internal Medicine

## 2022-03-20 DIAGNOSIS — I152 Hypertension secondary to endocrine disorders: Secondary | ICD-10-CM

## 2022-03-20 MED ORDER — OZEMPIC (0.25 OR 0.5 MG/DOSE) 2 MG/3ML ~~LOC~~ SOPN
0.5000 mg | PEN_INJECTOR | SUBCUTANEOUS | 3 refills | Status: DC
  Filled 2022-03-20: qty 3, 28d supply, fill #0

## 2022-03-21 ENCOUNTER — Other Ambulatory Visit: Payer: Self-pay | Admitting: Internal Medicine

## 2022-03-21 ENCOUNTER — Other Ambulatory Visit: Payer: Self-pay

## 2022-03-21 DIAGNOSIS — E785 Hyperlipidemia, unspecified: Secondary | ICD-10-CM

## 2022-04-02 ENCOUNTER — Other Ambulatory Visit: Payer: Self-pay | Admitting: Internal Medicine

## 2022-04-02 DIAGNOSIS — E119 Type 2 diabetes mellitus without complications: Secondary | ICD-10-CM

## 2022-04-11 ENCOUNTER — Encounter: Payer: Self-pay | Admitting: Internal Medicine

## 2022-04-11 ENCOUNTER — Other Ambulatory Visit: Payer: Self-pay | Admitting: Internal Medicine

## 2022-04-11 ENCOUNTER — Other Ambulatory Visit: Payer: Self-pay

## 2022-04-11 ENCOUNTER — Ambulatory Visit (INDEPENDENT_AMBULATORY_CARE_PROVIDER_SITE_OTHER): Payer: BC Managed Care – PPO | Admitting: Internal Medicine

## 2022-04-11 VITALS — BP 100/70 | HR 89 | Temp 98.1°F | Resp 14 | Ht 66.0 in | Wt 232.4 lb

## 2022-04-11 DIAGNOSIS — I152 Hypertension secondary to endocrine disorders: Secondary | ICD-10-CM

## 2022-04-11 DIAGNOSIS — E1159 Type 2 diabetes mellitus with other circulatory complications: Secondary | ICD-10-CM

## 2022-04-11 DIAGNOSIS — I10 Essential (primary) hypertension: Secondary | ICD-10-CM | POA: Diagnosis not present

## 2022-04-11 DIAGNOSIS — Z124 Encounter for screening for malignant neoplasm of cervix: Secondary | ICD-10-CM

## 2022-04-11 DIAGNOSIS — E785 Hyperlipidemia, unspecified: Secondary | ICD-10-CM | POA: Insufficient documentation

## 2022-04-11 DIAGNOSIS — Z0000 Encounter for general adult medical examination without abnormal findings: Secondary | ICD-10-CM | POA: Diagnosis not present

## 2022-04-11 DIAGNOSIS — R000 Tachycardia, unspecified: Secondary | ICD-10-CM

## 2022-04-11 DIAGNOSIS — Z1231 Encounter for screening mammogram for malignant neoplasm of breast: Secondary | ICD-10-CM

## 2022-04-11 DIAGNOSIS — Z6837 Body mass index (BMI) 37.0-37.9, adult: Secondary | ICD-10-CM | POA: Insufficient documentation

## 2022-04-11 DIAGNOSIS — Z1329 Encounter for screening for other suspected endocrine disorder: Secondary | ICD-10-CM | POA: Diagnosis not present

## 2022-04-11 DIAGNOSIS — F40240 Claustrophobia: Secondary | ICD-10-CM

## 2022-04-11 LAB — COMPREHENSIVE METABOLIC PANEL
ALT: 19 U/L (ref 0–35)
AST: 19 U/L (ref 0–37)
Albumin: 4.6 g/dL (ref 3.5–5.2)
Alkaline Phosphatase: 81 U/L (ref 39–117)
BUN: 17 mg/dL (ref 6–23)
CO2: 29 mEq/L (ref 19–32)
Calcium: 9.6 mg/dL (ref 8.4–10.5)
Chloride: 97 mEq/L (ref 96–112)
Creatinine, Ser: 0.99 mg/dL (ref 0.40–1.20)
GFR: 67.65 mL/min (ref >60.00)
Glucose, Bld: 87 mg/dL (ref 70–99)
Potassium: 3.7 mEq/L (ref 3.5–5.1)
Sodium: 137 mEq/L (ref 135–145)
Total Bilirubin: 0.4 mg/dL (ref 0.2–1.2)
Total Protein: 7.7 g/dL (ref 6.0–8.3)

## 2022-04-11 LAB — LIPID PANEL
Cholesterol: 162 mg/dL (ref 0–200)
HDL: 55.7 mg/dL (ref >39.00)
LDL Cholesterol: 97 mg/dL (ref 0–99)
NonHDL: 106.6
Total CHOL/HDL Ratio: 3
Triglycerides: 50 mg/dL (ref 0.0–149.0)
VLDL: 10 mg/dL (ref 0.0–40.0)

## 2022-04-11 LAB — CBC WITH DIFFERENTIAL/PLATELET
Basophils Absolute: 0 10*3/uL (ref 0.0–0.1)
Basophils Relative: 0.6 % (ref 0.0–3.0)
Eosinophils Absolute: 0.1 10*3/uL (ref 0.0–0.7)
Eosinophils Relative: 1.4 % (ref 0.0–5.0)
HCT: 34 % — ABNORMAL LOW (ref 36.0–46.0)
Hemoglobin: 11.3 g/dL — ABNORMAL LOW (ref 12.0–15.0)
Lymphocytes Relative: 23.3 % (ref 12.0–46.0)
Lymphs Abs: 1.8 10*3/uL (ref 0.7–4.0)
MCHC: 33.1 g/dL (ref 30.0–36.0)
MCV: 89.7 fl (ref 78.0–100.0)
Monocytes Absolute: 0.5 10*3/uL (ref 0.1–1.0)
Monocytes Relative: 6.2 % (ref 3.0–12.0)
Neutro Abs: 5.2 10*3/uL (ref 1.4–7.7)
Neutrophils Relative %: 68.5 % (ref 43.0–77.0)
Platelets: 418 10*3/uL — ABNORMAL HIGH (ref 150.0–400.0)
RBC: 3.79 Mil/uL — ABNORMAL LOW (ref 3.87–5.11)
RDW: 14.3 % (ref 11.5–15.5)
WBC: 7.5 10*3/uL (ref 4.0–10.5)

## 2022-04-11 LAB — TSH: TSH: 1.03 u[IU]/mL (ref 0.35–5.50)

## 2022-04-11 LAB — HEMOGLOBIN A1C: Hgb A1c MFr Bld: 6 % (ref 4.6–6.5)

## 2022-04-11 MED ORDER — METOPROLOL SUCCINATE ER 25 MG PO TB24: 25.0000 mg | ORAL_TABLET | Freq: Every day | ORAL | 3 refills | Status: DC

## 2022-04-11 MED ORDER — ALPRAZOLAM 0.5 MG PO TABS: 0.5000 mg | ORAL_TABLET | Freq: Every day | ORAL | 0 refills | Status: DC | PRN

## 2022-04-11 MED ORDER — OLMESARTAN MEDOXOMIL 20 MG PO TABS: 10.0000 mg | ORAL_TABLET | Freq: Every day | ORAL | 3 refills | Status: DC

## 2022-04-11 MED ORDER — SEMAGLUTIDE (1 MG/DOSE) 4 MG/3ML ~~LOC~~ SOPN
1.0000 mg | PEN_INJECTOR | SUBCUTANEOUS | 1 refills | Status: DC
  Filled 2022-04-11: qty 3, 28d supply, fill #0
  Filled 2022-05-27: qty 3, 28d supply, fill #1

## 2022-04-11 NOTE — Progress Notes (Signed)
Chief Complaint  Patient presents with   Annual Exam    Fasting this morning, denies any pain.   Annual  1. Htn with dm 2 low normal today toprol xl 25 mg qd, maxzide 37.5-25 mg qd, benicar 20 mg qd, pravachaol 20 mg qd, ozempic 0.5 weekly with weight loss    Review of Systems  Constitutional:  Negative for weight loss.  HENT:  Negative for hearing loss.   Eyes:  Negative for blurred vision.  Respiratory:  Negative for shortness of breath.   Cardiovascular:  Negative for chest pain.  Gastrointestinal:  Negative for abdominal pain and blood in stool.  Genitourinary:  Negative for dysuria.  Musculoskeletal:  Negative for falls and joint pain.  Skin:  Negative for rash.  Neurological:  Negative for headaches.  Psychiatric/Behavioral:  Negative for depression.    Past Medical History:  Diagnosis Date   Anemia    Anxiety 2014   Arthritis    right arm, right knee   Chicken pox    Depression    Frequent headaches    GERD (gastroesophageal reflux disease)    Hyperlipemia    Hypertension    Obesity (BMI 30-39.9)    Pre-diabetes    Sleep apnea    CPAP   Trigger finger    MIDDLE FINGER RIGHT HAND SURGERY ON JULY 31ST 2019   Past Surgical History:  Procedure Laterality Date   COLONOSCOPY WITH PROPOFOL N/A 07/18/2019   Procedure: COLONOSCOPY WITH PROPOFOL;  Surgeon: [name], MD;  Location: [place];  Service: Endoscopy;  Laterality: N/A;  Latex Sleep apnea   HYSTEROSCOPY WITH D & C N/A 05/20/2018   Procedure: DILATATION AND CURETTAGE /HYSTEROSCOPY;  Surgeon: [name], MD;  Location: [facility];  Service: Gynecology;  Laterality: N/A;   HYSTEROSCOPY WITH D & C N/A 10/25/2020   Procedure: DILATATION AND CURETTAGE /HYSTEROSCOPY;  Surgeon: [name], MD;  Location: [facility];  Service: Gynecology;  Laterality: N/A;   INTRAUTERINE DEVICE (IUD) INSERTION N/A 05/20/2018   Procedure: INTRAUTERINE DEVICE (IUD) INSERTION;  Surgeon: [name], MD;   Location: [facility];  Service: Gynecology;  Laterality: N/A;   INTRAUTERINE DEVICE (IUD) INSERTION  10/25/2020   Procedure: INTRAUTERINE DEVICE (IUD) INSERTION;  Surgeon: [name], MD;  Location: [facility];  Service: Gynecology;;   WISDOM TOOTH EXTRACTION     Family History  Problem Relation Age of Onset   Arthritis Mother    Hyperlipidemia Mother    Hypertension Mother    Diabetes Mellitus I Father        DM I   Diabetes Mellitus II Sister    Diabetes Mellitus II Sister    Hyperlipidemia Sister    Hypertension Sister    Breast cancer Maternal Grandmother 65   Arthritis Paternal Grandmother    Diabetes Mellitus II Paternal Grandmother    Arthritis Paternal Grandfather    Hyperlipidemia Paternal Grandfather    Stroke Paternal Grandfather    Hypertension Paternal Grandfather    Seizures Son    Social History   Socioeconomic History   Marital status: Married    Spouse name: Not on file   Number of children: 2   Years of education: Not on file   Highest education level: Not on file  Occupational History   Occupation: RN  Tobacco Use   Smoking status: Never   Smokeless tobacco: Never  Vaping Use   Vaping Use: Never used  Substance and Sexual Activity   Alcohol use: Yes  Alcohol/week: 2.0 standard drinks of alcohol    Types: 2 Glasses of wine per week    Comment: OCC-WEEKENDS   Drug use: No   Sexual activity: Yes    Partners: Male    Birth control/protection: Surgical    Comment: vasectomy  Other Topics Concern   Not on file  Social History Narrative   Married    2 kids    Works occup. [employer]    Social Determinants of Health   Financial Resource Strain: Not on file  Food Insecurity: Not on file  Transportation Needs: Not on file  Physical Activity: Inactive (03/23/2018)   Exercise Vital Sign    Days of Exercise per Week: 0 days    Minutes of Exercise per Session: 0 min  Stress: No Stress Concern Present (03/23/2018)   [place]    Feeling of Stress : Not at all  Social Connections: Not on file  Intimate Partner Violence: Not on file   Current Meds  Medication Sig   Accu-Chek FastClix Lancets MISC USE AS DIRECTED ONCE A DAY   ACCU-CHEK GUIDE test strip USE AS DIRECTED TO CHECK SUGAR ONCE A DAY   b complex vitamins capsule Take 1 capsule by mouth daily.   blood glucose meter kit and supplies Dispense based on patient and insurance preference. Use up to four times daily as directed. (FOR ICD-10 E10.9, E11.9).   Cholecalciferol (VITAMIN D3) 125 MCG (5000 UT) CAPS Take 5,000 Units by mouth daily.   Cyanocobalamin (B-12) 1000 MCG TABS Take 1,000 mcg by mouth daily.   docusate sodium (COLACE) 100 MG capsule Take 100 mg by mouth daily.    ELDERBERRY PO Take 1 each by mouth 2 (two) times daily. Gummies   ferrous sulfate 325 (65 FE) MG tablet Take 325 mg by mouth daily with breakfast.    levonorgestrel (MIRENA, 52 MG,) 20 MCG/DAY IUD 1 each by Intrauterine route once.   pravastatin (PRAVACHOL) 20 MG tablet TAKE 1 TABLET (20 MG TOTAL) BY MOUTH DAILY. AFTER 6 PM   Semaglutide,0.25 or 0.5MG/DOS, (OZEMPIC, 0.25 OR 0.5 MG/DOSE,) 2 MG/3ML SOPN Inject 0.5 mg into the skin once a week.   triamterene-hydrochlorothiazide (MAXZIDE-25) 37.5-25 MG tablet TAKE 1 TABLET BY MOUTH DAILY IN THE MORNING   TURMERIC PO Take 500 mg by mouth daily. With ginger    [DISCONTINUED] metoprolol succinate (TOPROL-XL) 25 MG 24 hr tablet Take 1 tablet (25 mg total) by mouth daily.   [DISCONTINUED] olmesartan (BENICAR) 20 MG tablet TAKE 1 TABLET BY MOUTH EVERY DAY   Allergies  Allergen Reactions   Latex Itching    GLOVES (extended exposure)   No results found for this or any previous visit (from the past 2160 hour(s)). Objective  Body mass index is 37.51 kg/m. Wt Readings from Last 3 Encounters:  04/11/22 232 lb 6.4 oz (105.4 kg)  10/11/21 241 lb 3.2 oz (109.4 kg)  06/25/21 237 lb 9.6 oz  (107.8 kg)   Temp Readings from Last 3 Encounters:  04/11/22 98.1 F (36.7 C) (Oral)  10/11/21 98.1 F (36.7 C) (Temporal)  04/05/21 97.8 F (36.6 C)   BP Readings from Last 3 Encounters:  04/11/22 100/70  10/11/21 122/78  06/25/21 129/81   Pulse Readings from Last 3 Encounters:  04/11/22 89  10/11/21 96  06/25/21 (!) 105    Physical Exam Vitals and nursing note reviewed.  Constitutional:      Appearance: Normal appearance. She is well-developed  and well-groomed.  HENT:     Head: Normocephalic and atraumatic.  Eyes:     Conjunctiva/sclera: Conjunctivae normal.     Pupils: Pupils are equal, round, and reactive to light.  Cardiovascular:     Rate and Rhythm: Normal rate and regular rhythm.     Heart sounds: Normal heart sounds. No murmur heard. Pulmonary:     Effort: Pulmonary effort is normal.     Breath sounds: Normal breath sounds.  Abdominal:     General: Abdomen is flat. Bowel sounds are normal.     Tenderness: There is no abdominal tenderness.  Musculoskeletal:        General: No tenderness.  Skin:    General: Skin is warm and dry.  Neurological:     General: No focal deficit present.     Mental Status: She is alert and oriented to person, place, and time. Mental status is at baseline.     Cranial Nerves: Cranial nerves 2-12 are intact.     Motor: Motor function is intact.     Coordination: Coordination is intact.     Gait: Gait is intact.  Psychiatric:        Attention and Perception: Attention and perception normal.        Mood and Affect: Mood and affect normal.        Speech: Speech normal.        Behavior: Behavior normal. Behavior is cooperative.        Thought Content: Thought content normal.        Cognition and Memory: Cognition and memory normal.        Judgment: Judgment normal.     Assessment  Plan  Annual physical exam - Plan: Comprehensive metabolic panel, Lipid panel, Hemoglobin A1c, CBC with Differential/Platelet, TSH, Urinalysis,  Routine w reflex microscopic, Microalbumin / creatinine urine ratio See below  Hypertension low normal associated with diabetes ([place]) - Plan: Comprehensive metabolic panel, Lipid panel, Hemoglobin A1c, CBC with Differential/Platelet, Urinalysis, Routine w reflex microscopic, Microalbumin / creatinine urine ratio Reduce benicar 20 to 10 mg qd  toprol xl 25 mg qd, maxzide 37.5-25 mg qd, benicar 20 mg qd, pravachaol 20 mg qd, ozempic 0.5 weekly with weight loss Thyroid disorder screening - Plan: TSH  Sinus tachycardia - Plan: metoprolol succinate (TOPROL-XL) 25 MG 24 hr tablet  Essential hypertension - Plan: metoprolol succinate (TOPROL-XL) 25 MG 24 hr tablet  Claustrophobia - Plan: ALPRAZolam (XANAX) 0.5 MG tablet  HM Flu shot utd  utd tdap had at work in 2021 check date for me 08/20/20  Immune hep B and MMR  covid 2/2 had and pfizer 2/2   Consider prevnar 20 11/29/2022 , pna 23 11/29/21   Pap [facility] 03/23/18 neg no HPV testing done Endometrial Bx 05/20/18 negative with IUD now  Has IUD DUB 10/25/20 uterine fibroids x 2, IUD out, left 3.8 cyst IUD 2021  Referred [facility]   mammo 08/07/21 negative ordered   Colonoscopy 07/18/2019 normal [facility] repeat in 10 years    Declines HIV testing  rec healthy diet and exercise  rec take D3 2000 IUD daily   Ortho seeing for shoulder/ arm pain tried steroid injection, massage helped consider MRI in the past     Provider: Dr. Olivia Mackie [name]-Internal Medicine

## 2022-04-12 LAB — URINALYSIS, ROUTINE W REFLEX MICROSCOPIC
Bilirubin Urine: NEGATIVE
Glucose, UA: NEGATIVE
Hgb urine dipstick: NEGATIVE
Ketones, ur: NEGATIVE
Leukocytes,Ua: NEGATIVE
Nitrite: NEGATIVE
Protein, ur: NEGATIVE
Specific Gravity, Urine: 1.004 (ref 1.001–1.035)
pH: 7 (ref 5.0–8.0)

## 2022-04-12 LAB — MICROALBUMIN / CREATININE URINE RATIO
Creatinine, Urine: 22 mg/dL (ref 20–275)
Microalb, Ur: <0.2 mg/dL

## 2022-05-24 IMAGING — CR DG CHEST 2V
2 series · 2 of 2 positions shown · non-contrast
Comparison: 10/31/2019

CLINICAL DATA: Chest pain

EXAM:
CHEST - 2 VIEW

[chest pa]
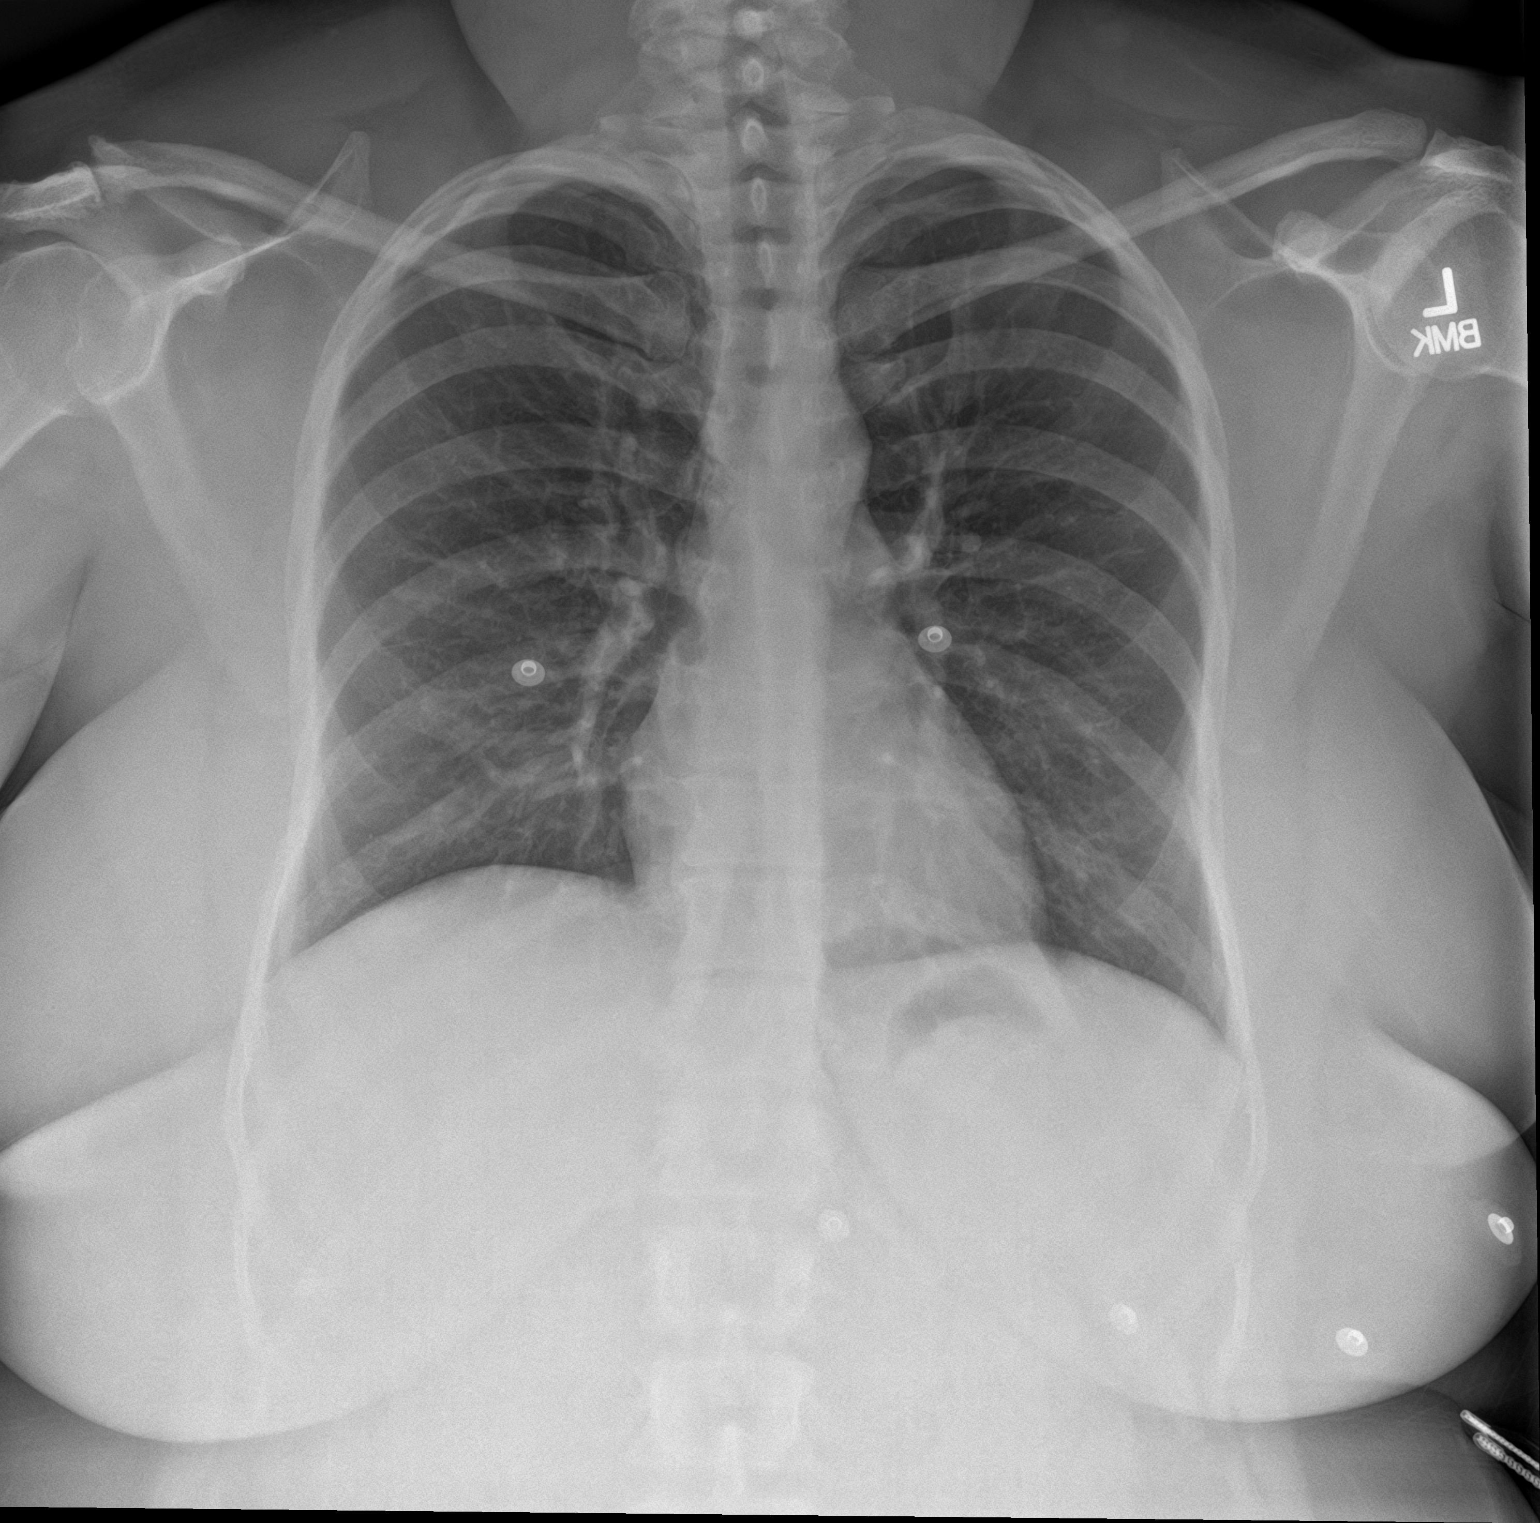

[chest lat]
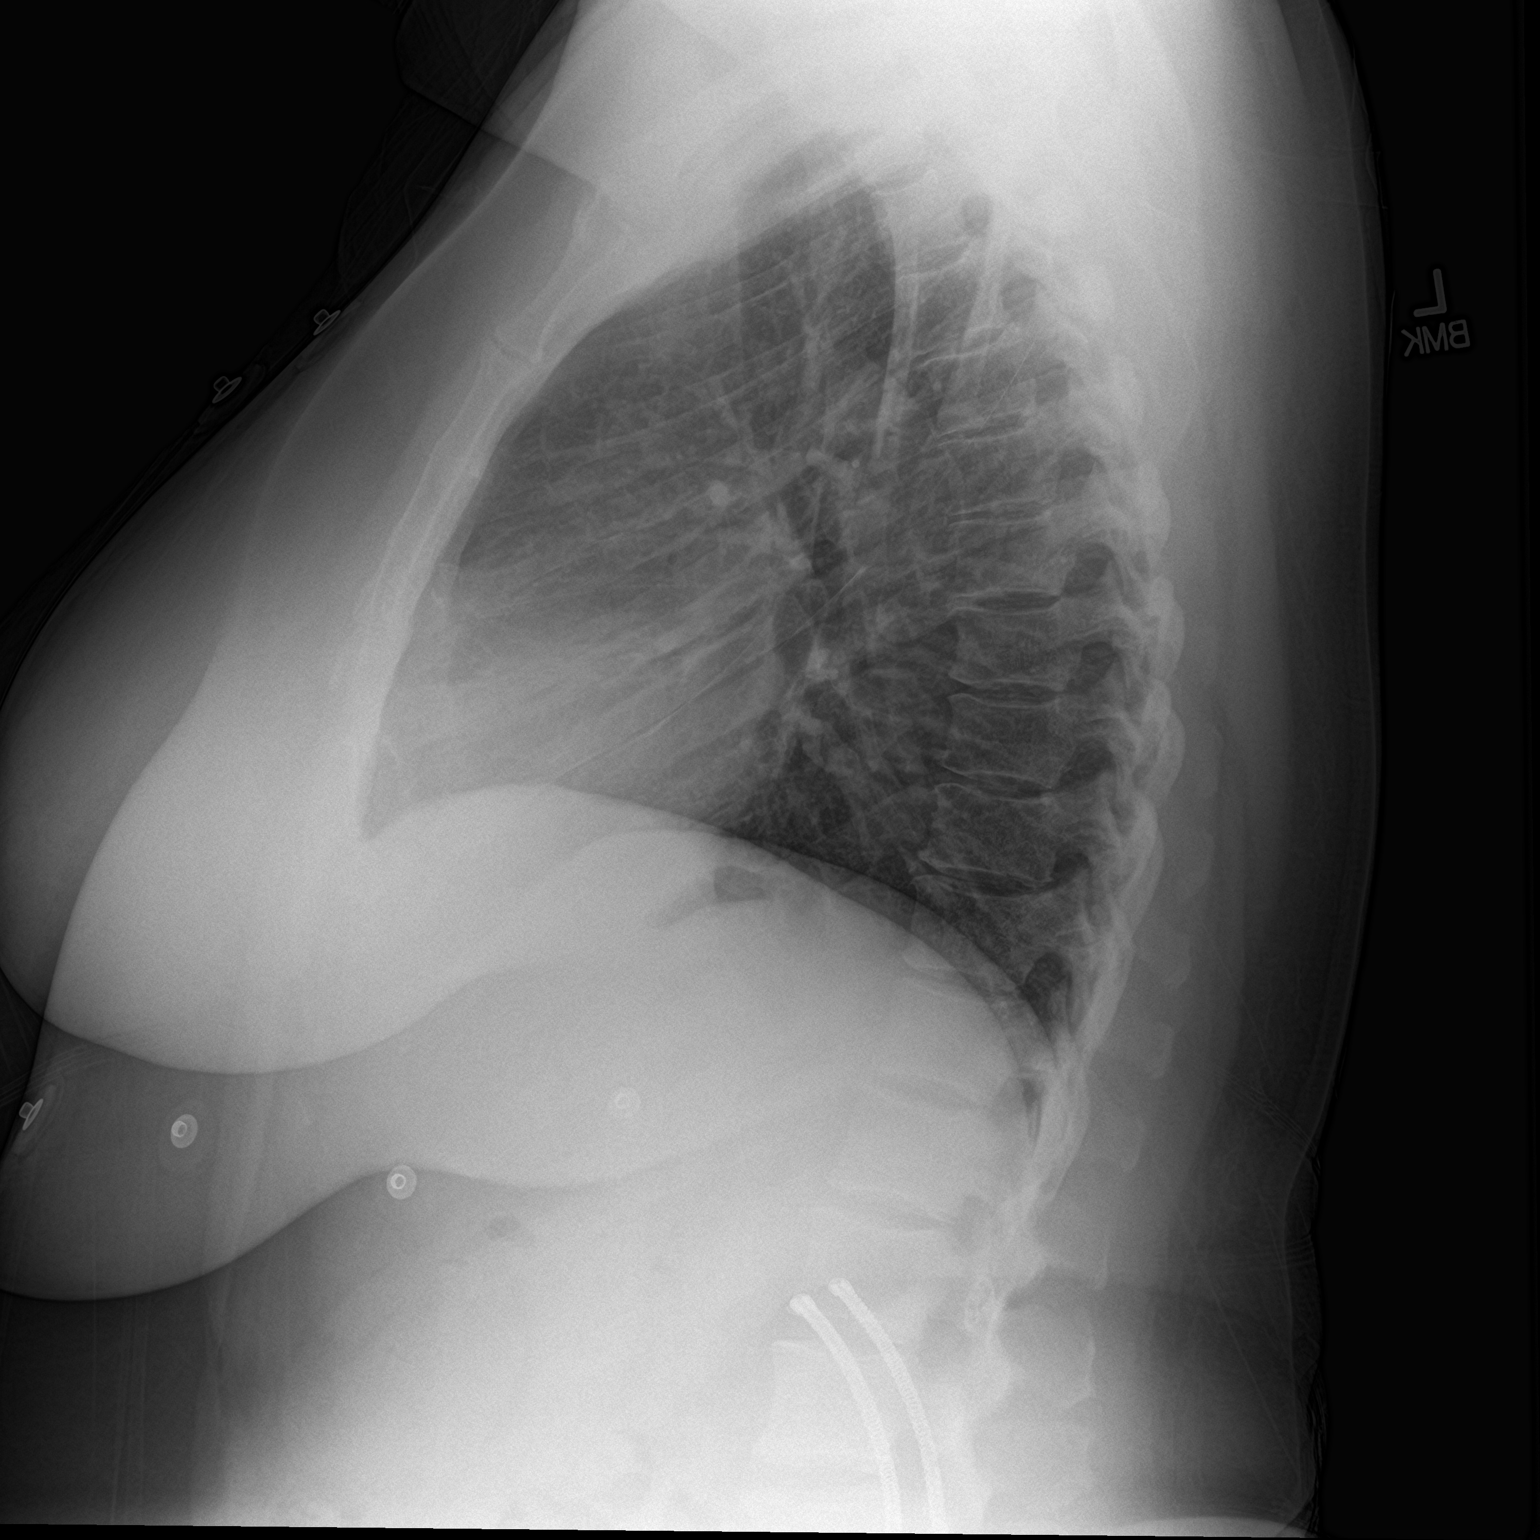

[2 of 2 positions shown; findings below may reference images not displayed]

FINDINGS: The heart size and mediastinal contours are within normal limits.
Both lungs are clear. The visualized skeletal structures are
unremarkable.
IMPRESSION: No active cardiopulmonary disease.

## 2022-05-26 DIAGNOSIS — N852 Hypertrophy of uterus: Secondary | ICD-10-CM | POA: Insufficient documentation

## 2022-05-27 ENCOUNTER — Other Ambulatory Visit: Payer: Self-pay

## 2022-05-29 ENCOUNTER — Encounter: Payer: Self-pay | Admitting: Internal Medicine

## 2022-05-30 ENCOUNTER — Other Ambulatory Visit: Payer: Self-pay | Admitting: Internal Medicine

## 2022-05-30 DIAGNOSIS — Z6837 Body mass index (BMI) 37.0-37.9, adult: Secondary | ICD-10-CM

## 2022-05-30 DIAGNOSIS — E1159 Type 2 diabetes mellitus with other circulatory complications: Secondary | ICD-10-CM

## 2022-05-30 DIAGNOSIS — E785 Hyperlipidemia, unspecified: Secondary | ICD-10-CM

## 2022-06-03 ENCOUNTER — Other Ambulatory Visit: Payer: Self-pay | Admitting: Family

## 2022-06-03 MED ORDER — SEMAGLUTIDE (2 MG/DOSE) 8 MG/3ML ~~LOC~~ SOPN: 2.0000 mg | PEN_INJECTOR | SUBCUTANEOUS | 2 refills | Status: DC

## 2022-06-23 ENCOUNTER — Other Ambulatory Visit: Payer: Self-pay | Admitting: Internal Medicine

## 2022-06-23 ENCOUNTER — Encounter: Payer: Self-pay | Admitting: Internal Medicine

## 2022-06-23 DIAGNOSIS — I152 Hypertension secondary to endocrine disorders: Secondary | ICD-10-CM

## 2022-06-23 MED ORDER — OLMESARTAN MEDOXOMIL 5 MG PO TABS: 5.0000 mg | ORAL_TABLET | Freq: Every day | ORAL | 3 refills | Status: DC

## 2022-06-26 ENCOUNTER — Ambulatory Visit: Payer: BC Managed Care – PPO | Admitting: Internal Medicine

## 2022-08-04 ENCOUNTER — Other Ambulatory Visit: Payer: Self-pay

## 2022-08-04 ENCOUNTER — Emergency Department
Admission: EM | Admit: 2022-08-04 | Discharge: 2022-08-04 | Disposition: A | Discharge status: Home / Self Care | Payer: BC Managed Care – PPO | Attending: Emergency Medicine | Admitting: Emergency Medicine

## 2022-08-04 ENCOUNTER — Emergency Department: Payer: BC Managed Care – PPO

## 2022-08-04 ENCOUNTER — Encounter: Payer: Self-pay | Admitting: Intensive Care

## 2022-08-04 DIAGNOSIS — R079 Chest pain, unspecified: Secondary | ICD-10-CM | POA: Insufficient documentation

## 2022-08-04 DIAGNOSIS — E876 Hypokalemia: Secondary | ICD-10-CM | POA: Insufficient documentation

## 2022-08-04 DIAGNOSIS — R000 Tachycardia, unspecified: Secondary | ICD-10-CM | POA: Diagnosis not present

## 2022-08-04 DIAGNOSIS — I4711 Inappropriate sinus tachycardia, so stated: Secondary | ICD-10-CM

## 2022-08-04 DIAGNOSIS — R12 Heartburn: Secondary | ICD-10-CM | POA: Insufficient documentation

## 2022-08-04 DIAGNOSIS — I10 Essential (primary) hypertension: Secondary | ICD-10-CM | POA: Insufficient documentation

## 2022-08-04 HISTORY — DX: Type 2 diabetes mellitus without complications: E11.9

## 2022-08-04 LAB — CBC
HCT: 37.4 % (ref 36.0–46.0)
Hemoglobin: 11.9 g/dL — ABNORMAL LOW (ref 12.0–15.0)
MCH: 28.6 pg (ref 26.0–34.0)
MCHC: 31.8 g/dL (ref 30.0–36.0)
MCV: 89.9 fL (ref 80.0–100.0)
Platelets: 457 10*3/uL — ABNORMAL HIGH (ref 150–400)
RBC: 4.16 MIL/uL (ref 3.87–5.11)
RDW: 12.9 % (ref 11.5–15.5)
WBC: 8.6 10*3/uL (ref 4.0–10.5)
nRBC: 0 % (ref 0.0–0.2)

## 2022-08-04 LAB — PROTIME-INR
INR: 1.1 (ref 0.8–1.2)
Prothrombin Time: 14 seconds (ref 11.4–15.2)

## 2022-08-04 LAB — BASIC METABOLIC PANEL
Anion gap: 11 (ref 5–15)
BUN: 17 mg/dL (ref 6–20)
CO2: 27 mmol/L (ref 22–32)
Calcium: 10.2 mg/dL (ref 8.9–10.3)
Chloride: 103 mmol/L (ref 98–111)
Creatinine, Ser: 1.21 mg/dL — ABNORMAL HIGH (ref 0.44–1.00)
GFR, Estimated: 55 mL/min — ABNORMAL LOW (ref >60)
Glucose, Bld: 143 mg/dL — ABNORMAL HIGH (ref 70–99)
Potassium: 3.1 mmol/L — ABNORMAL LOW (ref 3.5–5.1)
Sodium: 141 mmol/L (ref 135–145)

## 2022-08-04 LAB — T4, FREE: Free T4: 0.98 ng/dL (ref 0.61–1.12)

## 2022-08-04 LAB — APTT: aPTT: 33 seconds (ref 24–36)

## 2022-08-04 LAB — TROPONIN I (HIGH SENSITIVITY)
Troponin I (High Sensitivity): 4 ng/L (ref <18)
Troponin I (High Sensitivity): 9 ng/L (ref <18)

## 2022-08-04 LAB — BRAIN NATRIURETIC PEPTIDE: B Natriuretic Peptide: 5.3 pg/mL (ref 0.0–100.0)

## 2022-08-04 LAB — HCG, QUANTITATIVE, PREGNANCY: hCG, Beta Chain, Quant, S: 1 m[IU]/mL (ref <5)

## 2022-08-04 LAB — TSH: TSH: 2.54 u[IU]/mL (ref 0.350–4.500)

## 2022-08-04 LAB — MAGNESIUM: Magnesium: 1.8 mg/dL (ref 1.7–2.4)

## 2022-08-04 MED ORDER — METOPROLOL SUCCINATE ER 50 MG PO TB24: 50.0000 mg | ORAL_TABLET | Freq: Every day | ORAL | 0 refills | Status: DC

## 2022-08-04 MED ORDER — SODIUM CHLORIDE 0.9 % IV BOLUS
500.0000 mL | Freq: Once | INTRAVENOUS | Status: AC
  Administered 2022-08-04: 500 mL via INTRAVENOUS

## 2022-08-04 MED ORDER — ASPIRIN 81 MG PO CHEW
324.0000 mg | CHEWABLE_TABLET | Freq: Once | ORAL | Status: AC
  Administered 2022-08-04: 324 mg via ORAL
  Filled 2022-08-04: qty 4

## 2022-08-04 MED ORDER — METOPROLOL SUCCINATE ER 50 MG PO TB24
25.0000 mg | ORAL_TABLET | ORAL | Status: AC
  Administered 2022-08-04: 25 mg via ORAL
  Filled 2022-08-04: qty 1

## 2022-08-04 MED ORDER — POTASSIUM CHLORIDE CRYS ER 20 MEQ PO TBCR
40.0000 meq | EXTENDED_RELEASE_TABLET | Freq: Once | ORAL | Status: AC
  Administered 2022-08-04: 40 meq via ORAL
  Filled 2022-08-04: qty 2

## 2022-08-04 MED ORDER — IOHEXOL 350 MG/ML SOLN
75.0000 mL | Freq: Once | INTRAVENOUS | Status: AC | PRN
  Administered 2022-08-04: 75 mL via INTRAVENOUS

## 2022-08-04 NOTE — ED Provider Notes (Signed)
[facility] Provider Note    Event Date/Time   First MD Initiated Contact with Patient 08/04/22 [phone]     (approximate)   History   Chest Pain   HPI  Kathleen Burke is a 46 y.o. female history of hypertension, obesity hypercholesterolemia  Reviewed annual physical from June noted to have history of chronic tachycardia  Patient woke up at about 5 AM this morning with left-sided burning sensation in the upper chest with a pain that radiated towards her neck and left arm.  The neck and arm pain have since subsided.  She still reports a slight burning very mild in her left upper chest.  No recent illness.  She did eat a fried meal last night, and reports that she got up during the night and had 1 bowel movement, but feels perfectly fine now with no nausea or vomiting or abdominal pain.  No diarrhea.  No recent travel history.  No leg swelling.  Currently reports very mild burning sensation in the chest.  She also feels like she can feel her "heart racing"  Denies pregnancy.  IUD in place     Physical Exam   Triage Vital Signs: ED Triage Vitals  Enc Vitals Group     BP 08/04/22 0728 (!) 164/90     Pulse Rate 08/04/22 0728 (!) 145     Resp 08/04/22 0728 18     Temp 08/04/22 0728 97.6 F (36.4 C)     Temp Source 08/04/22 0728 Oral     SpO2 08/04/22 0728 97 %     Weight 08/04/22 0726 209 lb (94.8 kg)     Height 08/04/22 0726 5\' 6"  (1.676 m)     Head Circumference --      Peak Flow --      Pain Score 08/04/22 0726 4     Pain Loc --      Pain Edu? --      Excl. in [place]? --     Most recent vital signs: Vitals:   08/04/22 1330 08/04/22 1400  BP: 122/76 130/80  Pulse: 91 (!) 101  Resp: 15 12  Temp:    SpO2: 99% 100%     General: Awake, no distress.  Very pleasant, conversant.  Not in any apparent distress CV:  Good peripheral perfusion.  Normal heart tones, but tachycardic rate with variability ranging from approximately 1 50-1 30 and on  monitor appears to have sinus tachycardia Resp:  Normal effort.  Clear bilateral.  Normal work of breathing.  No accessory use Abd:  No distention.  Soft nontender nondistended through all quadrants.  Denies abdominal pain at this time Other:  No lower extremity edema   ED Results / Procedures / Treatments   Labs (all labs ordered are listed, but only abnormal results are displayed) Labs Reviewed  BASIC METABOLIC PANEL - Abnormal; Notable for the following components:      Result Value   Potassium 3.1 (*)    Glucose, Bld 143 (*)    Creatinine, Ser 1.21 (*)    GFR, Estimated 55 (*)    All other components within normal limits  CBC - Abnormal; Notable for the following components:   Hemoglobin 11.9 (*)    Platelets 457 (*)    All other components within normal limits  PROTIME-INR  APTT  TSH  T4, FREE  BRAIN NATRIURETIC PEPTIDE  HCG, QUANTITATIVE, PREGNANCY  MAGNESIUM  TROPONIN I (HIGH SENSITIVITY)  TROPONIN I (HIGH SENSITIVITY)  EKG  And interpreted by me at 7:30 AM heart rate 145 QRS 80 QTc 530 Sinus tachycardia.  Fairly significant severe tachycardia the noted appears to be sinus in nature.  Mild nonspecific T wave abnormality I suspect likely rate related.  No obvious frank ischemic abnormality   RADIOLOGY  Chest x-ray interpreted by me as negative for acute intrathoracic finding  Cxr CT   CT Angio Chest PE W and/or Wo Contrast  Result Date: 08/04/2022 CLINICAL DATA:  Chest pain rating to left axilla and associated left arm numbness. EXAM: CT ANGIOGRAPHY CHEST WITH CONTRAST TECHNIQUE: Multidetector CT imaging of the chest was performed using the standard protocol during bolus administration of intravenous contrast. Multiplanar CT image reconstructions and MIPs were obtained to evaluate the vascular anatomy. RADIATION DOSE REDUCTION: This exam was performed according to the departmental dose-optimization program which includes automated exposure control, adjustment  of the mA and/or kV according to patient size and/or use of iterative reconstruction technique. CONTRAST:  16mL OMNIPAQUE IOHEXOL 350 MG/ML SOLN COMPARISON:  None Available. FINDINGS: Cardiovascular: The heart size is normal. No substantial pericardial effusion. No thoracic aortic aneurysm. No substantial atherosclerosis of the thoracic aorta. Mediastinum/Nodes: No mediastinal lymphadenopathy. There is no hilar lymphadenopathy. The esophagus has normal imaging features. There is no axillary lymphadenopathy. Lungs/Pleura: No focal airspace consolidation. No pleural effusion. No suspicious pulmonary nodule or mass. Upper Abdomen: The liver shows diffusely decreased attenuation suggesting fat deposition. Musculoskeletal: No worrisome lytic or sclerotic osseous abnormality. Review of the MIP images confirms the above findings. IMPRESSION: 1. No CT evidence for acute pulmonary embolism. No acute cardiopulmonary findings. 2. Hepatic steatosis. Electronically Signed   By: Kennith Center M.D.   On: 08/04/2022 09:06   DG Chest Port 1 View  Result Date: 08/04/2022 CLINICAL DATA:  Chest pain EXAM: PORTABLE CHEST 1 VIEW COMPARISON:  03/19/2021 FINDINGS: The heart size and mediastinal contours are within normal limits. Both lungs are clear. The visualized skeletal structures are unremarkable. IMPRESSION: No active disease. Electronically Signed   By: Ernie Avena M.D.   On: 08/04/2022 08:03      PROCEDURES:  Critical Care performed: No  Procedures   MEDICATIONS ORDERED IN ED: Medications  aspirin chewable tablet 324 mg (324 mg Oral Given 08/04/22 0800)  metoprolol succinate (TOPROL-XL) 24 hr tablet 25 mg (25 mg Oral Given 08/04/22 0759)  potassium chloride SA (KLOR-CON M) CR tablet 40 mEq (40 mEq Oral Given 08/04/22 0915)  sodium chloride 0.9 % bolus 500 mL (0 mLs Intravenous Stopped 08/04/22 1250)  iohexol (OMNIPAQUE) 350 MG/ML injection 75 mL (75 mLs Intravenous Contrast Given 08/04/22 0853)  metoprolol  succinate (TOPROL-XL) 24 hr tablet 25 mg (25 mg Oral Given 08/04/22 1427)     IMPRESSION / MDM / ASSESSMENT AND PLAN / ED COURSE  I reviewed the triage vital signs and the nursing notes.                              Differential diagnosis includes, but is not limited to, ACS, aortic dissection, pulmonary embolism, cardiac tamponade, pneumothorax, pneumonia, pericarditis, myocarditis, GI-related causes including esophagitis/gastritis, and musculoskeletal chest wall pain.  Additionally, will check thyroid studies.  No clear symptoms that would suggest thyrotoxicosis though or Graves' disease or hypothyroid state.  She does have however tachycardia, and appears to be sinus in nature quite rapid with some palpitations associated  Patient's presentation is most consistent with acute complicated illness / injury requiring diagnostic  workup.  The patient is on the cardiac monitor to evaluate for evidence of arrhythmia and/or significant heart rate changes.  Clinical Course as of 08/04/22 1431  Mon Aug 04, 2022  0849 Mild to moderate hypokalemia and slight elevation of creatinine from baseline.  Provide oral potassium and hydrate.  Of note patient's heart rate seems to be improving, heart rate now trending about 110-115 and has a known history of baseline tachycardia. [MQ]  1354 Watching television reports she suddenly noticed her heart is racing again.  EKG she now has sinus tachycardia with a rate of 160.  Reports a tingly feeling in her chest with it.  I had her blow into a straw and do Valsalva maneuver twice and her heart rate did slow to about 130 still in sinus tachycardia.  Does not appear to have evidence of SVT [MQ]  1354 Appears to be sinus tachycardia, have paged for unassigned cardiology consult.  Patient reports previous patient of [facility] but no longer sees their practice due to insurance change [MQ]  1355 Patient resting comfortably heart rate approximately 125-130 [MQ]    Clinical  Course User Index [MQ] Sharyn Creamer, MD   ----------------------------------------- 1:27 PM on 08/04/2022 ----------------------------------------- Patient asymptomatic, vital signs of normalized.  I have placed consult with Dr. Morrie Sheldon, presently in the Cath Lab but aware of the pending cardiology consult   Cardiology has reviewed the case, reviewed old records including previous cardiac work-ups, and today's EKGs and clinical history.  Dr. Renato Gails advises the patient appears to be having inappropriate sinus tachycardia and his recommendation is close outpatient follow-up and to increase Toprol XL dose to 50 mg daily.  I have discussed this with the patient she is currently resting asymptomatic sinus rhythm heart rate 105.  Blood pressure 130 systolic fully awake and alert.  She is comfortable with this plan and will follow-up with outpatient cardiology.  FINAL CLINICAL IMPRESSION(S) / ED DIAGNOSES   Final diagnoses:  Chest pain with low risk for cardiac etiology  Inappropriate sinus tachycardia     Rx / DC Orders   ED Discharge Orders          Ordered    Ambulatory referral to Cardiology       Comments: [facility] Cardiology, ED chest pain follow-up   08/04/22 1343    metoprolol succinate (TOPROL XL) 50 MG 24 hr tablet  Daily        08/04/22 1431             Note:  This document was prepared using Dragon voice recognition software and may include unintentional dictation errors.   Sharyn Creamer, MD 08/04/22 1432

## 2022-08-04 NOTE — ED Notes (Addendum)
ED provider at bedside examining pt. Pt complains that she woke up with burning chest pain. Heart rate is 140 at this time.

## 2022-08-04 NOTE — Discharge Instructions (Addendum)
You have been seen in the Emergency Department (ED) today for chest pain.  As we have discussed today's test results are normal, but you may require further testing.  We will be increasing your dose of Toprol-XL to 50 mg daily instead of previously 25.  Please follow up with the recommended doctor as instructed above in these documents regarding today's emergent visit and your recent symptoms to discuss further management.  Continue to take your regular medications. If you are not doing so already, please also take a daily baby aspirin (81 mg), at least until you follow up with your doctor.  Return to the Emergency Department (ED) if you experience any further chest pain/pressure/tightness, difficulty breathing, or sudden sweating, or other symptoms that concern you.

## 2022-08-04 NOTE — ED Triage Notes (Signed)
Patient c/o chest burning this AM. Now pain is radiating to left axilla and arm with numbness. Reports abdominal pain last night

## 2022-08-04 NOTE — ED Notes (Signed)
Pt A&O, IV removed, pt given discharge instructions, pt ambulating with steady gait. 

## 2022-08-11 ENCOUNTER — Encounter: Payer: Self-pay | Admitting: Internal Medicine

## 2022-08-11 ENCOUNTER — Other Ambulatory Visit: Payer: Self-pay | Admitting: Family

## 2022-08-11 MED ORDER — SEMAGLUTIDE (2 MG/DOSE) 8 MG/3ML ~~LOC~~ SOPN: 2.0000 mg | PEN_INJECTOR | SUBCUTANEOUS | 0 refills | Status: DC

## 2022-08-13 ENCOUNTER — Telehealth: Payer: Self-pay

## 2022-08-13 ENCOUNTER — Other Ambulatory Visit: Payer: Self-pay | Admitting: Internal Medicine

## 2022-08-13 DIAGNOSIS — I10 Essential (primary) hypertension: Secondary | ICD-10-CM

## 2022-08-13 MED ORDER — TRIAMTERENE-HCTZ 37.5-25 MG PO TABS: 1.0000 | ORAL_TABLET | Freq: Every morning | ORAL | 1 refills | Status: DC

## 2022-08-13 NOTE — Telephone Encounter (Signed)
Patient states she is out of her triamterene-hydrochlorothiazide (MAXZIDE-25) 37.5-25 MG tablet.  Patient states her pharmacy has reached out to [place] and they are waiting for our response.  *Patient states her preferred pharmacy is [facility] on [address] in [place] ([facility]IDE [address]).

## 2022-08-14 ENCOUNTER — Other Ambulatory Visit: Payer: Self-pay

## 2022-08-14 NOTE — Telephone Encounter (Signed)
Noted. Pt notified.  

## 2022-08-18 ENCOUNTER — Ambulatory Visit
Admission: RE | Admit: 2022-08-18 | Discharge: 2022-08-18 | Disposition: A | Discharge status: Home / Self Care | Payer: BC Managed Care – PPO | Source: Ambulatory Visit | Attending: Internal Medicine | Admitting: Internal Medicine

## 2022-08-18 DIAGNOSIS — Z1231 Encounter for screening mammogram for malignant neoplasm of breast: Secondary | ICD-10-CM | POA: Insufficient documentation

## 2022-08-20 ENCOUNTER — Telehealth: Payer: Self-pay

## 2022-08-20 NOTE — Telephone Encounter (Signed)
Patient states she needs a refill for her Ozempic 2 MG.  Patient states she was told that there is a Psychologist, prison and probation services for this medication with this dose.  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  Patient states she would like to know if she should take the 1 MG dose just to have some to take.  *Patient states [facility] of [facility], but she is willing to pick it up from the [place] if she needs to, just let her know.

## 2022-08-21 ENCOUNTER — Other Ambulatory Visit: Payer: Self-pay

## 2022-08-21 MED ORDER — SEMAGLUTIDE (1 MG/DOSE) 4 MG/3ML ~~LOC~~ SOPN
1.0000 mg | PEN_INJECTOR | SUBCUTANEOUS | 0 refills | Status: DC
  Filled 2022-08-21: qty 3, 28d supply, fill #0
  Filled 2022-09-12: qty 3, 28d supply, fill #1

## 2022-08-21 NOTE — Progress Notes (Signed)
Semaglutide, 1 MG/DOSE, 4 MG/3ML SOPN sent to [facility] pharmacy per pt's request. See tele note.

## 2022-08-21 NOTE — Telephone Encounter (Signed)
Yes she can go to 1 mg and can stay at this dose until her next a1c is checked.

## 2022-08-21 NOTE — Telephone Encounter (Signed)
90 day supply of Ozempic 1 mg sent to [place].

## 2022-08-21 NOTE — Telephone Encounter (Signed)
Ok to send prescription for Ozempic 1 mg weekly for 90 day supply Thanks

## 2022-08-26 DIAGNOSIS — R002 Palpitations: Secondary | ICD-10-CM | POA: Insufficient documentation

## 2022-09-12 ENCOUNTER — Other Ambulatory Visit: Payer: Self-pay

## 2022-12-08 ENCOUNTER — Telehealth: Payer: BC Managed Care – PPO | Admitting: Physician Assistant

## 2022-12-08 DIAGNOSIS — B9689 Other specified bacterial agents as the cause of diseases classified elsewhere: Secondary | ICD-10-CM

## 2022-12-08 DIAGNOSIS — J208 Acute bronchitis due to other specified organisms: Secondary | ICD-10-CM | POA: Diagnosis not present

## 2022-12-08 MED ORDER — DOXYCYCLINE HYCLATE 100 MG PO TABS: 100.0000 mg | ORAL_TABLET | Freq: Two times a day (BID) | ORAL | 0 refills | Status: DC

## 2022-12-08 MED ORDER — BENZONATATE 100 MG PO CAPS: 100.0000 mg | ORAL_CAPSULE | Freq: Three times a day (TID) | ORAL | 0 refills | Status: DC | PRN

## 2022-12-08 NOTE — Progress Notes (Signed)
Virtual Visit Consent   Kathleen Burke, you are scheduled for a virtual visit with a [facility] provider today. Just as with appointments in the office, your consent must be obtained to participate. Your consent will be active for this visit and any virtual visit you may have with one of our providers in the next 365 days. If you have a MyChart account, a copy of this consent can be sent to you electronically.  As this is a virtual visit, video technology does not allow for your provider to perform a traditional examination. This may limit your provider's ability to fully assess your condition. If your provider identifies any concerns that need to be evaluated in person or the need to arrange testing (such as labs, EKG, etc.), we will make arrangements to do so. Although advances in technology are sophisticated, we cannot ensure that it will always work on either your end or our end. If the connection with a video visit is poor, the visit may have to be switched to a telephone visit. With either a video or telephone visit, we are not always able to ensure that we have a secure connection.  By engaging in this virtual visit, you consent to the provision of healthcare and authorize for your insurance to be billed (if applicable) for the services provided during this visit. Depending on your insurance coverage, you may receive a charge related to this service.  I need to obtain your verbal consent now. Are you willing to proceed with your visit today? CHYSTAL GIRTY has provided verbal consent on 12/08/2022 for a virtual visit (video or telephone). Kathleen Burke, [place]  Date: 12/08/2022 3:00 PM  Virtual Visit via Video Note   I, Kathleen Burke, connected with  Kathleen Burke  ([id number], 09/21/1976) on 12/08/22 at  3:00 PM EST by a video-enabled telemedicine application and verified that I am speaking with the correct person using two identifiers.  Location: Patient: Virtual Visit Location  Patient: Home Provider: Virtual Visit Location Provider: Home Office   I discussed the limitations of evaluation and management by telemedicine and the availability of in person appointments. The patient expressed understanding and agreed to proceed.    History of Present Illness: Kathleen Burke is a 47 y.o. who identifies as a female who was assigned female at birth, and is being seen today for URI symptoms starting back on 1/25 at which time she had a telehealth visit outside of [facility] and told to start Mucinex. Notes still with chest congestion and cough that is productive of green phlegm. Denies chest pain. Denies SOB outside of coughing. O2 at 99 RA. Some headache but denies sinus pressure or pain.   OTC -- Mucinex, Alka Seltzer Cold and Flu.  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   Prediabetes 03/22/2020   Vitamin D deficiency 03/22/2020   CTS (carpal tunnel syndrome) 12/30/2019   Low back pain 09/28/2019   Special screening for malignant neoplasms, colon    Dizziness 06/16/2019   Numbness and tingling in both hands 06/16/2019   OSA (obstructive sleep apnea) 06/16/2019   Nonintractable headache 06/16/2019   Edema 06/16/2019   Eczema  12/01/2018   Acute pain of right shoulder 12/01/2018   Epidermal inclusion cyst 12/01/2018   Right arm pain 12/01/2018   Sinus tachycardia 12/01/2018   HTN (hypertension) 06/22/2018   Trigger finger, right 06/22/2018   No blood products 05/11/2018   Low vitamin D level 03/23/2018   Microcytic hypochromic anemia 03/23/2018   Anxiety and depression 01/14/2016   Obesity (BMI 30-39.9) 01/14/2016    Annual physical exam 01/14/2016    Allergies:  Allergies  Allergen Reactions   Latex Itching    GLOVES (extended exposure)   Medications:  Current Outpatient Medications:    benzonatate (TESSALON) 100 MG capsule, Take 1 capsule (100 mg total) by mouth 3 (three) times daily as needed for cough., Disp: 30 capsule, Rfl: 0   doxycycline (VIBRA-TABS) 100 MG tablet, Take 1 tablet (100 mg total) by mouth 2 (two) times daily., Disp: 14 tablet, Rfl: 0   Accu-Chek FastClix Lancets MISC, USE AS DIRECTED ONCE A DAY, Disp: 102 each, Rfl: 12   ACCU-CHEK GUIDE test strip, USE AS DIRECTED TO CHECK SUGAR ONCE A DAY, Disp: 100 strip, Rfl: 3   ALPRAZolam (XANAX) 0.5 MG tablet, Take 1 tablet (0.5 mg total) by mouth daily as needed for anxiety., Disp: 20 tablet, Rfl: 0   b complex vitamins capsule, Take 1 capsule by mouth daily., Disp: , Rfl:    blood glucose meter kit and supplies, Dispense based on patient and insurance preference. Use up to four times daily as directed. (FOR ICD-10 E10.9, E11.9)., Disp: 1 each, Rfl: 0   Cholecalciferol (VITAMIN D3) 125 MCG (5000 UT) CAPS, Take 5,000 Units by mouth daily., Disp: , Rfl:    Cyanocobalamin (B-12) 1000 MCG TABS, Take 1,000 mcg by mouth daily., Disp: , Rfl:    docusate sodium (COLACE) 100 MG capsule, Take 100 mg by mouth daily. , Disp: , Rfl:    ELDERBERRY PO, Take 1 each by mouth 2 (two) times daily. Gummies, Disp: , Rfl:    ferrous sulfate 325 (65 FE) MG tablet, Take 325 mg by mouth daily with breakfast. , Disp: , Rfl:    levonorgestrel (MIRENA, 52 MG,) 20 MCG/DAY IUD, 1 each by Intrauterine route once., Disp: , Rfl:    metoprolol succinate (TOPROL XL) 50 MG 24 hr tablet, Take 1 tablet (50 mg total) by mouth daily. Take with or immediately following a meal., Disp: 30 tablet, Rfl: 0   pravastatin (PRAVACHOL) 20 MG tablet, TAKE 1 TABLET (20 MG TOTAL) BY MOUTH DAILY. AFTER 6 PM, Disp: 90 tablet, Rfl: 3   Semaglutide, 1 MG/DOSE, 4 MG/3ML SOPN, Inject 1 mg as directed  once a week., Disp: 9 mL, Rfl: 0   Semaglutide, 2 MG/DOSE, 8 MG/3ML SOPN, Inject 2 mg as directed once a week., Disp: 9 mL, Rfl: 0   TURMERIC PO, Take 500 mg by mouth daily. With ginger , Disp: , Rfl:   Observations/Objective: Patient is well-developed, well-nourished in no acute distress.  Resting comfortably at work  Head is normocephalic, atraumatic.  No labored breathing. Speech is clear and coherent with logical content.  Patient is alert and oriented at baseline.   Assessment and Plan: 1. Acute bacterial bronchitis - benzonatate (TESSALON) 100 MG capsule; Take 1 capsule (100 mg total) by mouth 3 (three) times daily as needed for cough.  Dispense: 30 capsule; Refill: 0 - doxycycline (VIBRA-TABS) 100 MG tablet; Take 1 tablet (100 mg total) by mouth 2 (two) times daily.  Dispense: 14 tablet; Refill: 0  Rx Doxycycline.  Increase fluids.  Rest.  Saline nasal spray.  Probiotic.  Mucinex as directed.  Humidifier in bedroom. Tessalon per orders.  Call or return to clinic if symptoms are not improving.   Follow Up Instructions: I discussed the assessment and treatment plan with the patient. The patient was provided an opportunity to ask questions and all were answered. The patient agreed with the plan and demonstrated an understanding of the instructions.  A copy of instructions were sent to the patient via MyChart unless otherwise noted below.   The patient was advised to call back or seek an in-person evaluation if the symptoms worsen or if the condition fails to improve as anticipated.  Time:  I spent 10 minutes with the patient via telehealth technology discussing the above problems/concerns.    Kathleen Rio, PA-C

## 2022-12-08 NOTE — Patient Instructions (Signed)
Sandi Mealy, thank you for joining [name], PA-C for today's virtual visit.  While this provider is not your primary care provider (PCP), if your PCP is located in our provider database this encounter information will be shared with them immediately following your visit.   [facility] account gives you access to today's visit and all your visits, tests, and labs performed at [facility] " click here if you don't have a [facility] account or go to [email or web address]  Consent: (Patient) Kathleen Burke provided verbal consent for this virtual visit at the beginning of the encounter.  Current Medications:  Current Outpatient Medications:    Accu-Chek FastClix Lancets MISC, USE AS DIRECTED ONCE A DAY, Disp: 102 each, Rfl: 12   ACCU-CHEK GUIDE test strip, USE AS DIRECTED TO CHECK SUGAR ONCE A DAY, Disp: 100 strip, Rfl: 3   ALPRAZolam (XANAX) 0.5 MG tablet, Take 1 tablet (0.5 mg total) by mouth daily as needed for anxiety., Disp: 20 tablet, Rfl: 0   b complex vitamins capsule, Take 1 capsule by mouth daily., Disp: , Rfl:    blood glucose meter kit and supplies, Dispense based on patient and insurance preference. Use up to four times daily as directed. (FOR ICD-10 E10.9, E11.9)., Disp: 1 each, Rfl: 0   Cholecalciferol (VITAMIN D3) 125 MCG (5000 UT) CAPS, Take 5,000 Units by mouth daily., Disp: , Rfl:    Cyanocobalamin (B-12) 1000 MCG TABS, Take 1,000 mcg by mouth daily., Disp: , Rfl:    docusate sodium (COLACE) 100 MG capsule, Take 100 mg by mouth daily. , Disp: , Rfl:    ELDERBERRY PO, Take 1 each by mouth 2 (two) times daily. Gummies, Disp: , Rfl:    ferrous sulfate 325 (65 FE) MG tablet, Take 325 mg by mouth daily with breakfast. , Disp: , Rfl:    levonorgestrel (MIRENA, 52 MG,) 20 MCG/DAY IUD, 1 each by Intrauterine route once., Disp: , Rfl:    metoprolol succinate (TOPROL XL) 50 MG 24 hr tablet, Take 1 tablet (50 mg total) by mouth daily. Take  with or immediately following a meal., Disp: 30 tablet, Rfl: 0   olmesartan (BENICAR) 5 MG tablet, Take 1 tablet (5 mg total) by mouth daily. D/c 20 and 10 mg doses, Disp: 90 tablet, Rfl: 3   pravastatin (PRAVACHOL) 20 MG tablet, TAKE 1 TABLET (20 MG TOTAL) BY MOUTH DAILY. AFTER 6 PM, Disp: 90 tablet, Rfl: 3   Semaglutide, 1 MG/DOSE, 4 MG/3ML SOPN, Inject 1 mg as directed once a week., Disp: 9 mL, Rfl: 0   Semaglutide, 2 MG/DOSE, 8 MG/3ML SOPN, Inject 2 mg as directed once a week., Disp: 9 mL, Rfl: 0   triamterene-hydrochlorothiazide (MAXZIDE-25) 37.5-25 MG tablet, TAKE 1 TABLET BY MOUTH EVERY DAY IN THE MORNING, Disp: 90 tablet, Rfl: 3   triamterene-hydrochlorothiazide (MAXZIDE-25) 37.5-25 MG tablet, Take 1 tablet by mouth every morning., Disp: 90 tablet, Rfl: 1   TURMERIC PO, Take 500 mg by mouth daily. With ginger , Disp: , Rfl:    Medications ordered in this encounter:  No orders of the defined types were placed in this encounter.    *If you need refills on other medications prior to your next appointment, please contact your pharmacy*  Follow-Up: Call back or seek an in-person evaluation if the symptoms worsen or if the condition fails to improve as anticipated.  [place] [phone]  Other Instructions Take antibiotic (Doxycycline) as directed.  Increase  fluids.  Get plenty of rest. Use Mucinex for congestion. Tessalon as directed. Take a daily probiotic (I recommend Align or Culturelle, but even Activia Yogurt may be beneficial).  A humidifier placed in the bedroom may offer some relief for a dry, scratchy throat of nasal irritation.  Read information below on acute bronchitis. Please call or return to clinic if symptoms are not improving.  Acute Bronchitis Bronchitis is when the airways that extend from the windpipe into the lungs get red, puffy, and painful (inflamed). Bronchitis often causes thick spit (mucus) to develop. This leads to a cough. A cough is the most  common symptom of bronchitis. In acute bronchitis, the condition usually begins suddenly and goes away over time (usually in 2 weeks). Smoking, allergies, and asthma can make bronchitis worse. Repeated episodes of bronchitis may cause more lung problems.  HOME CARE Rest. Drink enough fluids to keep your pee (urine) clear or pale yellow (unless you need to limit fluids as told by your doctor). Only take over-the-counter or prescription medicines as told by your doctor. Avoid smoking and secondhand smoke. These can make bronchitis worse. If you are a smoker, think about using nicotine gum or skin patches. Quitting smoking will help your lungs heal faster. Reduce the chance of getting bronchitis again by: Washing your hands often. Avoiding people with cold symptoms. Trying not to touch your hands to your mouth, nose, or eyes. Follow up with your doctor as told.  GET HELP IF: Your symptoms do not improve after 1 week of treatment. Symptoms include: Cough. Fever. Coughing up thick spit. Body aches. Chest congestion. Chills. Shortness of breath. Sore throat.  GET HELP RIGHT AWAY IF:  You have an increased fever. You have chills. You have severe shortness of breath. You have bloody thick spit (sputum). You throw up (vomit) often. You lose too much body fluid (dehydration). You have a severe headache. You faint.  MAKE SURE YOU:  Understand these instructions. Will watch your condition. Will get help right away if you are not doing well or get worse. Document Released: 03/31/2008 Document Revised: 06/15/2013 Document Reviewed: 04/05/2013 [facility] Patient Information 2015 [place], [place]. This information is not intended to replace advice given to you by your health care provider. Make sure you discuss any questions you have with your health care provider.    If you have been instructed to have an in-person evaluation today at a local Urgent Care facility, please use the link below.  It will take you to a list of all of our available [facility], including address, phone number and hours of operation. Please do not delay care.  [facility]  If you or a family member do not have a primary care provider, use the link below to schedule a visit and establish care. When you choose a [facility] primary care physician or advanced practice provider, you gain a long-term partner in health. Find a Primary Care Provider  Learn more about [facility]'s in-office and virtual care options: [facility]

## 2022-12-25 ENCOUNTER — Telehealth: Payer: BC Managed Care – PPO | Admitting: Family Medicine

## 2022-12-25 ENCOUNTER — Telehealth: Payer: BC Managed Care – PPO | Admitting: Physician Assistant

## 2022-12-25 DIAGNOSIS — J0100 Acute maxillary sinusitis, unspecified: Secondary | ICD-10-CM | POA: Diagnosis not present

## 2022-12-25 DIAGNOSIS — J329 Chronic sinusitis, unspecified: Secondary | ICD-10-CM

## 2022-12-25 MED ORDER — AMOXICILLIN-POT CLAVULANATE 875-125 MG PO TABS: 1.0000 | ORAL_TABLET | Freq: Two times a day (BID) | ORAL | 0 refills | Status: DC

## 2022-12-25 MED ORDER — FLUCONAZOLE 150 MG PO TABS: ORAL_TABLET | ORAL | 0 refills | Status: DC

## 2022-12-25 NOTE — Progress Notes (Signed)
Virtual Visit Consent   YAN DOTTS, you are scheduled for a virtual visit with a [facility] provider today. Just as with appointments in the office, your consent must be obtained to participate. Your consent will be active for this visit and any virtual visit you may have with one of our providers in the next 365 days. If you have a MyChart account, a copy of this consent can be sent to you electronically.  As this is a virtual visit, video technology does not allow for your provider to perform a traditional examination. This may limit your provider's ability to fully assess your condition. If your provider identifies any concerns that need to be evaluated in person or the need to arrange testing (such as labs, EKG, etc.), we will make arrangements to do so. Although advances in technology are sophisticated, we cannot ensure that it will always work on either your end or our end. If the connection with a video visit is poor, the visit may have to be switched to a telephone visit. With either a video or telephone visit, we are not always able to ensure that we have a secure connection.  By engaging in this virtual visit, you consent to the provision of healthcare and authorize for your insurance to be billed (if applicable) for the services provided during this visit. Depending on your insurance coverage, you may receive a charge related to this service.  I need to obtain your verbal consent now. Are you willing to proceed with your visit today? Kathleen Burke has provided verbal consent on 12/25/2022 for a virtual visit (video or telephone). [name], [place]  Date: 12/25/2022 7:44 PM  Virtual Visit via Video Note   I, [name], connected with  Kathleen Burke  ([id number], 49-11-1975) on 12/25/22 at  7:45 PM EST by a video-enabled telemedicine application and verified that I am speaking with the correct person using two identifiers.  Location: Patient: Virtual Visit Location  Patient: Home Provider: Virtual Visit Location Provider: Home Office   I discussed the limitations of evaluation and management by telemedicine and the availability of in person appointments. The patient expressed understanding and agreed to proceed.    History of Present Illness: Kathleen Burke is a 47 y.o. who identifies as a female who was assigned female at birth, and is being seen today for maxillary sinus pressure and sinus pain over the past couple of weeks. Was seen a few weeks ago for bronchitis, which was treated with full resolution of these symptoms. Denies known dental cavities or any noted gingival swelling, irritation or tooth pain. Denies fever, chills, aches. Tylenol OTC for sinus headache.   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   Prediabetes 03/22/2020   Vitamin D deficiency 03/22/2020   CTS (carpal tunnel syndrome) 12/30/2019   Low back pain 09/28/2019   Special screening for malignant neoplasms, colon    Dizziness 06/16/2019   Numbness and tingling in both hands 06/16/2019   OSA (obstructive sleep apnea) 06/16/2019   Nonintractable headache 06/16/2019   Edema 06/16/2019   Eczema 12/01/2018   Acute pain of right shoulder 12/01/2018   Epidermal inclusion  cyst 12/01/2018   Right arm pain 12/01/2018   Sinus tachycardia 12/01/2018   HTN (hypertension) 06/22/2018   Trigger finger, right 06/22/2018   No blood products 05/11/2018   Low vitamin D level 03/23/2018   Microcytic hypochromic anemia 03/23/2018   Anxiety and depression 01/14/2016   Obesity (BMI 30-39.9) 01/14/2016   Annual physical exam 01/14/2016     Allergies:  Allergies  Allergen Reactions   Latex Itching    GLOVES (extended exposure)   Medications:  Current Outpatient Medications:    Accu-Chek FastClix Lancets MISC, USE AS DIRECTED ONCE A DAY, Disp: 102 each, Rfl: 12   ACCU-CHEK GUIDE test strip, USE AS DIRECTED TO CHECK SUGAR ONCE A DAY, Disp: 100 strip, Rfl: 3   ALPRAZolam (XANAX) 0.5 MG tablet, Take 1 tablet (0.5 mg total) by mouth daily as needed for anxiety., Disp: 20 tablet, Rfl: 0   b complex vitamins capsule, Take 1 capsule by mouth daily., Disp: , Rfl:    blood glucose meter kit and supplies, Dispense based on patient and insurance preference. Use up to four times daily as directed. (FOR ICD-10 E10.9, E11.9)., Disp: 1 each, Rfl: 0   Cholecalciferol (VITAMIN D3) 125 MCG (5000 UT) CAPS, Take 5,000 Units by mouth daily., Disp: , Rfl:    Cyanocobalamin (B-12) 1000 MCG TABS, Take 1,000 mcg by mouth daily., Disp: , Rfl:    docusate sodium (COLACE) 100 MG capsule, Take 100 mg by mouth daily. , Disp: , Rfl:    ELDERBERRY PO, Take 1 each by mouth 2 (two) times daily. Gummies, Disp: , Rfl:    ferrous sulfate 325 (65 FE) MG tablet, Take 325 mg by mouth daily with breakfast. , Disp: , Rfl:    levonorgestrel (MIRENA, 52 MG,) 20 MCG/DAY IUD, 1 each by Intrauterine route once., Disp: , Rfl:    metoprolol succinate (TOPROL XL) 50 MG 24 hr tablet, Take 1 tablet (50 mg total) by mouth daily. Take with or immediately following a meal., Disp: 30 tablet, Rfl: 0   pravastatin (PRAVACHOL) 20 MG tablet, TAKE 1 TABLET (20 MG TOTAL) BY MOUTH DAILY. AFTER 6 PM, Disp: 90 tablet, Rfl: 3   Semaglutide, 1 MG/DOSE, 4 MG/3ML SOPN, Inject 1 mg as directed once a week., Disp: 9 mL, Rfl: 0   Semaglutide, 2 MG/DOSE, 8 MG/3ML SOPN, Inject 2 mg as directed once a week., Disp: 9 mL, Rfl: 0   TURMERIC PO, Take 500 mg by mouth daily. With ginger , Disp: , Rfl:   Observations/Objective: Patient is well-developed, well-nourished in no acute distress.  Resting  comfortably  at home.  Head is normocephalic, atraumatic.  No labored breathing.  Speech is clear and coherent with logical content.  Patient is alert and oriented at baseline.   Assessment and Plan: 1. Acute non-recurrent maxillary sinusitis  Rx Augmentin.  Increase fluids.  Rest.  Saline nasal spray.  Probiotic.  Mucinex as directed.  Humidifier in bedroom. Flonase OTC.  Call or return to clinic if symptoms are not improving.   Follow Up Instructions: I discussed the assessment and treatment plan with the patient. The patient was provided an opportunity to ask questions and all were answered. The patient agreed with the plan and demonstrated an understanding of the instructions.  A copy of instructions were sent to the patient via MyChart unless otherwise noted below.   The patient was advised to call back or seek an in-person evaluation if the symptoms worsen or if the condition fails to improve as  anticipated.  Time:  I spent 10 minutes with the patient via telehealth technology discussing the above problems/concerns.    [name], PA-C

## 2022-12-25 NOTE — Progress Notes (Signed)
Because you recently have treatment in last 2-3 weeks with antibiotics for similar symptoms, I feel your condition warrants further evaluation and I recommend that you be seen in a face to face visit.   NOTE: There will be NO CHARGE for this eVisit

## 2022-12-25 NOTE — Patient Instructions (Signed)
Sandi Mealy, thank you for joining [name], PA-C for today's virtual visit.  While this provider is not your primary care provider (PCP), if your PCP is located in our provider database this encounter information will be shared with them immediately following your visit.   [place] account gives you access to today's visit and all your visits, tests, and labs performed at [facility] " click here if you don't have a [place] account or go to [email or web address]  Consent: (Patient) Kathleen Burke provided verbal consent for this virtual visit at the beginning of the encounter.  Current Medications:  Current Outpatient Medications:    Accu-Chek FastClix Lancets MISC, USE AS DIRECTED ONCE A DAY, Disp: 102 each, Rfl: 12   ACCU-CHEK GUIDE test strip, USE AS DIRECTED TO CHECK SUGAR ONCE A DAY, Disp: 100 strip, Rfl: 3   ALPRAZolam (XANAX) 0.5 MG tablet, Take 1 tablet (0.5 mg total) by mouth daily as needed for anxiety., Disp: 20 tablet, Rfl: 0   b complex vitamins capsule, Take 1 capsule by mouth daily., Disp: , Rfl:    benzonatate (TESSALON) 100 MG capsule, Take 1 capsule (100 mg total) by mouth 3 (three) times daily as needed for cough., Disp: 30 capsule, Rfl: 0   blood glucose meter kit and supplies, Dispense based on patient and insurance preference. Use up to four times daily as directed. (FOR ICD-10 E10.9, E11.9)., Disp: 1 each, Rfl: 0   Cholecalciferol (VITAMIN D3) 125 MCG (5000 UT) CAPS, Take 5,000 Units by mouth daily., Disp: , Rfl:    Cyanocobalamin (B-12) 1000 MCG TABS, Take 1,000 mcg by mouth daily., Disp: , Rfl:    docusate sodium (COLACE) 100 MG capsule, Take 100 mg by mouth daily. , Disp: , Rfl:    doxycycline (VIBRA-TABS) 100 MG tablet, Take 1 tablet (100 mg total) by mouth 2 (two) times daily., Disp: 14 tablet, Rfl: 0   ELDERBERRY PO, Take 1 each by mouth 2 (two) times daily. Gummies, Disp: , Rfl:    ferrous sulfate 325 (65 FE)  MG tablet, Take 325 mg by mouth daily with breakfast. , Disp: , Rfl:    levonorgestrel (MIRENA, 52 MG,) 20 MCG/DAY IUD, 1 each by Intrauterine route once., Disp: , Rfl:    metoprolol succinate (TOPROL XL) 50 MG 24 hr tablet, Take 1 tablet (50 mg total) by mouth daily. Take with or immediately following a meal., Disp: 30 tablet, Rfl: 0   pravastatin (PRAVACHOL) 20 MG tablet, TAKE 1 TABLET (20 MG TOTAL) BY MOUTH DAILY. AFTER 6 PM, Disp: 90 tablet, Rfl: 3   Semaglutide, 1 MG/DOSE, 4 MG/3ML SOPN, Inject 1 mg as directed once a week., Disp: 9 mL, Rfl: 0   Semaglutide, 2 MG/DOSE, 8 MG/3ML SOPN, Inject 2 mg as directed once a week., Disp: 9 mL, Rfl: 0   TURMERIC PO, Take 500 mg by mouth daily. With ginger , Disp: , Rfl:    Medications ordered in this encounter:  No orders of the defined types were placed in this encounter.    *If you need refills on other medications prior to your next appointment, please contact your pharmacy*  Follow-Up: Call back or seek an in-person evaluation if the symptoms worsen or if the condition fails to improve as anticipated.  [facility] [phone]  Other Instructions Please take antibiotic as directed.  Increase fluid intake.  Use Saline nasal spray.  Take a daily multivitamin. Start Flonase OTC. You  can use an OTC claritin or allegra as well.  Place a humidifier in the bedroom.  Please call or return clinic if symptoms are not improving.  Sinusitis Sinusitis is redness, soreness, and swelling (inflammation) of the paranasal sinuses. Paranasal sinuses are air pockets within the bones of your face (beneath the eyes, the middle of the forehead, or above the eyes). In healthy paranasal sinuses, mucus is able to drain out, and air is able to circulate through them by way of your nose. However, when your paranasal sinuses are inflamed, mucus and air can become trapped. This can allow bacteria and other germs to grow and cause infection. Sinusitis can  develop quickly and last only a short time (acute) or continue over a long period (chronic). Sinusitis that lasts for more than 12 weeks is considered chronic.  CAUSES  Causes of sinusitis include: Allergies. Structural abnormalities, such as displacement of the cartilage that separates your nostrils (deviated septum), which can decrease the air flow through your nose and sinuses and affect sinus drainage. Functional abnormalities, such as when the small hairs (cilia) that line your sinuses and help remove mucus do not work properly or are not present. SYMPTOMS  Symptoms of acute and chronic sinusitis are the same. The primary symptoms are pain and pressure around the affected sinuses. Other symptoms include: Upper toothache. Earache. Headache. Bad breath. Decreased sense of smell and taste. A cough, which worsens when you are lying flat. Fatigue. Fever. Thick drainage from your nose, which often is green and may contain pus (purulent). Swelling and warmth over the affected sinuses. DIAGNOSIS  Your caregiver will perform a physical exam. During the exam, your caregiver may: Look in your nose for signs of abnormal growths in your nostrils (nasal polyps). Tap over the affected sinus to check for signs of infection. View the inside of your sinuses (endoscopy) with a special imaging device with a light attached (endoscope), which is inserted into your sinuses. If your caregiver suspects that you have chronic sinusitis, one or more of the following tests may be recommended: Allergy tests. Nasal culture A sample of mucus is taken from your nose and sent to a lab and screened for bacteria. Nasal cytology A sample of mucus is taken from your nose and examined by your caregiver to determine if your sinusitis is related to an allergy. TREATMENT  Most cases of acute sinusitis are related to a viral infection and will resolve on their own within 10 days. Sometimes medicines are prescribed to help  relieve symptoms (pain medicine, decongestants, nasal steroid sprays, or saline sprays).  However, for sinusitis related to a bacterial infection, your caregiver will prescribe antibiotic medicines. These are medicines that will help kill the bacteria causing the infection.  Rarely, sinusitis is caused by a fungal infection. In theses cases, your caregiver will prescribe antifungal medicine. For some cases of chronic sinusitis, surgery is needed. Generally, these are cases in which sinusitis recurs more than 3 times per year, despite other treatments. HOME CARE INSTRUCTIONS  Drink plenty of water. Water helps thin the mucus so your sinuses can drain more easily. Use a humidifier. Inhale steam 3 to 4 times a day (for example, sit in the bathroom with the shower running). Apply a warm, moist washcloth to your face 3 to 4 times a day, or as directed by your caregiver. Use saline nasal sprays to help moisten and clean your sinuses. Take over-the-counter or prescription medicines for pain, discomfort, or fever only as directed by your  caregiver. SEEK IMMEDIATE MEDICAL CARE IF: You have increasing pain or severe headaches. You have nausea, vomiting, or drowsiness. You have swelling around your face. You have vision problems. You have a stiff neck. You have difficulty breathing. MAKE SURE YOU:  Understand these instructions. Will watch your condition. Will get help right away if you are not doing well or get worse. Document Released: 10/13/2005 Document Revised: 01/05/2012 Document Reviewed: 10/28/2011 [facility] Patient Information 2014 [place], [place].    If you have been instructed to have an in-person evaluation today at a local Urgent Care facility, please use the link below. It will take you to a list of all of our available [facility], including address, phone number and hours of operation. Please do not delay care.  [facility]  If you or a family member do not  have a primary care provider, use the link below to schedule a visit and establish care. When you choose a [facility] primary care physician or advanced practice provider, you gain a long-term partner in health. Find a Primary Care Provider  Learn more about 's in-office and virtual care options: [facility]

## 2023-01-05 ENCOUNTER — Telehealth: Payer: Self-pay

## 2023-01-05 NOTE — Progress Notes (Unsigned)
Tomasita Morrow, NP-C Phone: [phone]  Kathleen Burke is a 47 y.o. female who presents today for transfer of care. She has no complaints today. She is doing well on all of her medications and is requesting refills. She is due for her annual exam in June. She would like to see Pulmonology regarding her CPAP machine. She has lost approximately 56 lbs and is not sure if she still requires it or needs a new mask as she feels like hers no longer fits properly.   HYPERTENSION Disease Monitoring: Blood pressure range- 118/70 Chest pain- No      Dyspnea- No Medications: Compliance- Toprol XL 50 Lightheadedness- No   Edema- No  Lab Results  Component Value Date   NA 141 08/04/2022   K 3.1 (L) 08/04/2022   CO2 27 08/04/2022   GLUCOSE 143 (H) 08/04/2022   BUN 17 08/04/2022   CREATININE 1.21 (H) 08/04/2022   CALCIUM 10.2 08/04/2022   EGFR 92 03/29/2021   GFRNONAA 55 (L) 08/04/2022    DIABETES Disease Monitoring: Blood Sugar ranges- Checking every morning, fasting around 90 Polyuria/phagia/dipsia- Occasional polyuria      Optho- Yes, UTD Medications: Compliance- Ozempic Hypoglycemic symptoms- No  Lab Results  Component Value Date   HGBA1C 6.0 04/11/2022     HYPERLIPIDEMIA Disease Monitoring: See symptoms for Hypertension Medications: Compliance- Pravastatin Right upper quadrant pain- No  Muscle aches- No  Lab Results  Component Value Date   CHOL 162 04/11/2022   HDL 55.70 04/11/2022   LDLCALC 97 04/11/2022   TRIG 50.0 04/11/2022   CHOLHDL 3 04/11/2022    GERD:   Reflux symptoms: Only with certain foods   Abd pain: No   Blood in stool: No  Dysphagia: No   EGD: No  Medication: None, diet controlled- patient aware of dietary triggers  Social History   Tobacco Use  Smoking Status Never  Smokeless Tobacco Never    Current Outpatient Medications on File Prior to Visit  Medication Sig Dispense Refill   Accu-Chek FastClix Lancets MISC USE AS DIRECTED ONCE A DAY 102 each  12   ACCU-CHEK GUIDE test strip USE AS DIRECTED TO CHECK SUGAR ONCE A DAY 100 strip 3   b complex vitamins capsule Take 1 capsule by mouth daily.     blood glucose meter kit and supplies Dispense based on patient and insurance preference. Use up to four times daily as directed. (FOR ICD-10 E10.9, E11.9). 1 each 0   Cholecalciferol (VITAMIN D3) 125 MCG (5000 UT) CAPS Take 5,000 Units by mouth daily.     Cyanocobalamin (B-12) 1000 MCG TABS Take 1,000 mcg by mouth daily.     docusate sodium (COLACE) 100 MG capsule Take 100 mg by mouth daily.      ELDERBERRY PO Take 1 each by mouth 2 (two) times daily. Gummies     ferrous sulfate 325 (65 FE) MG tablet Take 325 mg by mouth daily with breakfast.      levonorgestrel (MIRENA, 52 MG,) 20 MCG/DAY IUD 1 each by Intrauterine route once.     TURMERIC PO Take 500 mg by mouth daily. With ginger      No current facility-administered medications on file prior to visit.    ROS see history of present illness  Objective  Physical Exam Vitals:   01/06/23 0848  BP: 126/82  Pulse: 68  Temp: 99.1 F (37.3 C)  SpO2: 99%    BP Readings from Last 3 Encounters:  01/06/23 126/82  08/04/22 120/79  04/11/22 100/70   Wt Readings from Last 3 Encounters:  01/06/23 199 lb 3.2 oz (90.4 kg)  08/04/22 209 lb (94.8 kg)  04/11/22 232 lb 6.4 oz (105.4 kg)    Physical Exam Constitutional:      General: She is not in acute distress.    Appearance: Normal appearance.  HENT:     Head: Normocephalic.  Cardiovascular:     Rate and Rhythm: Normal rate and regular rhythm.     Heart sounds: Normal heart sounds.  Pulmonary:     Effort: Pulmonary effort is normal.     Breath sounds: Normal breath sounds.  Skin:    General: Skin is warm and dry.  Neurological:     General: No focal deficit present.     Mental Status: She is alert.  Psychiatric:        Mood and Affect: Mood normal.        Behavior: Behavior normal.    Assessment/Plan: Please see individual  problem list.  Primary hypertension Assessment & Plan: Chronic. Stable on Toprol XL 50 mg daily. Continue. Refills sent. Recent Cardiology note from 11/17/2022 reviewed, follow up as scheduled.   Orders: -     Metoprolol Succinate ER; Take 1 tablet (50 mg total) by mouth daily. Take with or immediately following a meal. D/C HCTZ and Toprol 25  Dispense: 90 tablet; Refill: 3  OSA (obstructive sleep apnea) Assessment & Plan: Recent weight loss of approximately 56 lbs. Will refer back to Pulmonology for new sleep study/CPAP mask fitting.   Orders: -     Ambulatory referral to Pulmonology  Hyperlipidemia, unspecified hyperlipidemia type Assessment & Plan: Chronic. Stable on Pravastatin 20 mg daily. Continue. Refills sent. Will check fasting lipids prior to next appointment. Encouraged healthy diet and exercise.   Orders: -     Pravastatin Sodium; Take 1 tablet (20 mg total) by mouth daily. After 6 pm  Dispense: 90 tablet; Refill: 3  Prediabetes Assessment & Plan: Last A1c- 6.0. Stable on Ozempic 2 mg weekly. Continue. Refills sent. Will check A1c prior to next appointment. Encouraged healthy diet and exercise.   Orders: -     Semaglutide (2 MG/DOSE); Inject 2 mg as directed once a week.  Dispense: 9 mL; Refill: 3  Gastroesophageal reflux disease without esophagitis Assessment & Plan: Chronic. Stable with diet control. Patient aware of dietary triggers. Continue. Encouraged patient to contact if symptoms worsening or occurring more frequently. Will monitor.    Anxiety with flying Assessment & Plan: Non-Opioid Controlled Substance Agreement signed today in office. Xanax 0.5 mg PRN #30 for 30 days. PDMP reviewed, last filled in June 2023. Patient reports only taking prior to flying and in extreme cases of claustrophobia. Reports upcoming travel. Refill sent. Will monitor.   Orders: -     ALPRAZolam; Take 1 tablet (0.5 mg total) by mouth daily as needed for anxiety.  Dispense: 30  tablet; Refill: 0  Vitamin D deficiency Assessment & Plan: Chronic. Taking OTC daily supplement. Continue. Will check vitamin D level prior to next appointment.     Return in about 3 months (around 04/13/2023) for Annual Exam. Please complete lab work 2-3 days prior.   Tomasita Morrow, NP-C [place]

## 2023-01-05 NOTE — Telephone Encounter (Signed)
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

## 2023-01-06 ENCOUNTER — Encounter: Payer: Self-pay | Admitting: Nurse Practitioner

## 2023-01-06 ENCOUNTER — Ambulatory Visit: Payer: BC Managed Care – PPO | Admitting: Nurse Practitioner

## 2023-01-06 VITALS — BP 126/82 | HR 68 | Temp 99.1°F | Ht 66.0 in | Wt 199.2 lb

## 2023-01-06 DIAGNOSIS — E559 Vitamin D deficiency, unspecified: Secondary | ICD-10-CM

## 2023-01-06 DIAGNOSIS — I10 Essential (primary) hypertension: Secondary | ICD-10-CM | POA: Diagnosis not present

## 2023-01-06 DIAGNOSIS — E785 Hyperlipidemia, unspecified: Secondary | ICD-10-CM

## 2023-01-06 DIAGNOSIS — K219 Gastro-esophageal reflux disease without esophagitis: Secondary | ICD-10-CM

## 2023-01-06 DIAGNOSIS — R7303 Prediabetes: Secondary | ICD-10-CM | POA: Diagnosis not present

## 2023-01-06 DIAGNOSIS — G4733 Obstructive sleep apnea (adult) (pediatric): Secondary | ICD-10-CM

## 2023-01-06 DIAGNOSIS — F40243 Fear of flying: Secondary | ICD-10-CM

## 2023-01-06 MED ORDER — ALPRAZOLAM 0.5 MG PO TABS: 0.5000 mg | ORAL_TABLET | Freq: Every day | ORAL | 0 refills | Status: DC | PRN

## 2023-01-06 MED ORDER — METOPROLOL SUCCINATE ER 50 MG PO TB24: 50.0000 mg | ORAL_TABLET | Freq: Every day | ORAL | 3 refills | Status: DC

## 2023-01-06 MED ORDER — SEMAGLUTIDE (2 MG/DOSE) 8 MG/3ML ~~LOC~~ SOPN: 2.0000 mg | PEN_INJECTOR | SUBCUTANEOUS | 3 refills | Status: DC

## 2023-01-06 MED ORDER — PRAVASTATIN SODIUM 20 MG PO TABS: 20.0000 mg | ORAL_TABLET | Freq: Every day | ORAL | 3 refills | Status: DC

## 2023-01-06 NOTE — Assessment & Plan Note (Addendum)
Chronic. Stable on Pravastatin 20 mg daily. Continue. Refills sent. Will check fasting lipids prior to next appointment. Encouraged healthy diet and exercise.

## 2023-01-06 NOTE — Assessment & Plan Note (Signed)
Non-Opioid Controlled Substance Agreement signed today in office. Xanax 0.5 mg PRN #30 for 30 days. PDMP reviewed, last filled in June 2023. Patient reports only taking prior to flying and in extreme cases of claustrophobia. Reports upcoming travel. Refill sent. Will monitor.

## 2023-01-06 NOTE — Assessment & Plan Note (Signed)
Chronic. Stable with diet control. Patient aware of dietary triggers. Continue. Encouraged patient to contact if symptoms worsening or occurring more frequently. Will monitor.

## 2023-01-06 NOTE — Assessment & Plan Note (Addendum)
Chronic. Stable on Toprol XL 50 mg daily. Continue. Refills sent. Recent Cardiology note from 11/17/2022 reviewed, follow up as scheduled.

## 2023-01-06 NOTE — Assessment & Plan Note (Addendum)
Last A1c- 6.0. Stable on Ozempic 2 mg weekly. Continue. Refills sent. Will check A1c prior to next appointment. Encouraged healthy diet and exercise.

## 2023-01-06 NOTE — Assessment & Plan Note (Signed)
Chronic. Taking OTC daily supplement. Continue. Will check vitamin D level prior to next appointment.

## 2023-01-06 NOTE — Assessment & Plan Note (Signed)
Recent weight loss of approximately 56 lbs. Will refer back to Pulmonology for new sleep study/CPAP mask fitting.

## 2023-02-12 NOTE — Patient Instructions (Addendum)
Your sleep apnea is well controlled on CPAP, current pressure settings are auto 9-20cmh20 (average you are using is between 8-10)  Recommendations: Try adjusting ramp time or turning it off Try adjusting humidification or turning it off  We will lower your CPAP pressure setting  If above not helpful we can go back to auto settings and/or get mask fitting   Orders: Change CPAP pressure to 9cm h20  Follow-up Virtual visit in 2-4 weeks with [facility] NP or sooner if needed

## 2023-02-12 NOTE — Progress Notes (Signed)
  ID: Kathleen Burke, female    DOB: 1975/10/29, 47 y.o.   MRN: [id number]  Chief Complaint  Patient presents with   sleep consult    Wearing cpap avg 3hr nightly- unable to tolerate mask.     Referring provider: Bethanie Dicker, NP  HPI: 47 year old female, never smoked.  Past medical history significant for hypertension, OSA, GERD, anxiety/depression, hyperlipidemia, microcytic anemia.   02/13/2023 Patient presents today for overdue follow-up for OSA.  Patient of Dr. Craige Cotta, last seen in December 2020. HST 04/11/19 >> AHI 16, SpO2 low 84% . She is maintained on CPAP auto settings 9 to 20 cm H2O. Lately having difficulty tolerating, she reports taking her CPAP off in the middle of the night. Feels like she is suffocating. She uses full face mask, size medium. Not experience much airleaks. She still reports benefit from wearing CPAP. She has morning headache and is tired if she does not wear CPAP at night.   Airview download 01/13/23-02/11/23 Usage 23/30 days (77%); 7 days (23%) >4 hours Averge usage days used 3 hours 5 mins Pressure 9-20cm h20 (9.3cm h20-95%) Airleaks 0.4L/min (95%) AHI 0.1  Sleep questionnaire Symptoms-   tired, headache, foggy Prior sleep study- 2020 Bedtime- 10pm Time to fall asleep- 20 mins Nocturnal awakenings- varies, 1-2 times  Out of bed/start of day- 5:30am-6am Weight changes- lost 60 lbs  Do you operate heavy machinery- no Do you currently wear CPAP- yes Do you current wear oxygen- yes [facility]- 11   Allergies  Allergen Reactions   Latex Itching    GLOVES (extended exposure)    Immunization History  Administered Date(s) Administered   COVID-19, mRNA, vaccine(Comirnaty)12 years and older 08/15/2022   Influenza,inj,Quad PF,6+ Mos 07/18/2020   Influenza-Unspecified 06/26/2015, 06/16/2018, 08/18/2019, 08/06/2021, 06/27/2022   Moderna Sars-Covid-2 Vaccination 02/02/2020, 03/01/2020   PFIZER(Purple Top)SARS-COV-2 Vaccination 08/31/2020   Pfizer  Covid-19 Vaccine Bivalent Booster 74yrs & up 08/30/2021   Pneumococcal Polysaccharide-23 11/29/2021   Tdap 11/25/2009, 08/20/2020    Past Medical History:  Diagnosis Date   Anemia    Anxiety 2014   Arthritis    right arm, right knee   Chicken pox    Depression    Diabetes mellitus without complication    Frequent headaches    GERD (gastroesophageal reflux disease)    Hyperlipemia    Hypertension    Obesity (BMI 30-39.9)    Pre-diabetes    Sleep apnea    CPAP   Trigger finger    MIDDLE FINGER RIGHT HAND SURGERY ON JULY 31ST 2019    Tobacco History: Social History   Tobacco Use  Smoking Status Never  Smokeless Tobacco Never   Counseling given: Not Answered   Outpatient Medications Prior to Visit  Medication Sig Dispense Refill   Accu-Chek FastClix Lancets MISC USE AS DIRECTED ONCE A DAY 102 each 12   ACCU-CHEK GUIDE test strip USE AS DIRECTED TO CHECK SUGAR ONCE A DAY 100 strip 3   ALPRAZolam (XANAX) 0.5 MG tablet Take 1 tablet (0.5 mg total) by mouth daily as needed for anxiety. 30 tablet 0   b complex vitamins capsule Take 1 capsule by mouth daily.     blood glucose meter kit and supplies Dispense based on patient and insurance preference. Use up to four times daily as directed. (FOR ICD-10 E10.9, E11.9). 1 each 0   Cholecalciferol (VITAMIN D3) 125 MCG (5000 UT) CAPS Take 5,000 Units by mouth daily.     Cyanocobalamin (B-12) 1000 MCG TABS Take 1,000  mcg by mouth daily.     docusate sodium (COLACE) 100 MG capsule Take 100 mg by mouth daily.      ELDERBERRY PO Take 1 each by mouth 2 (two) times daily. Gummies     ferrous sulfate 325 (65 FE) MG tablet Take 325 mg by mouth daily with breakfast.      levonorgestrel (MIRENA, 52 MG,) 20 MCG/DAY IUD 1 each by Intrauterine route once.     metoprolol succinate (TOPROL XL) 50 MG 24 hr tablet Take 1 tablet (50 mg total) by mouth daily. Take with or immediately following a meal. D/C HCTZ and Toprol 25 90 tablet 3   pravastatin  (PRAVACHOL) 20 MG tablet Take 1 tablet (20 mg total) by mouth daily. After 6 pm 90 tablet 3   Semaglutide, 2 MG/DOSE, 8 MG/3ML SOPN Inject 2 mg as directed once a week. 9 mL 3   TURMERIC PO Take 500 mg by mouth daily. With ginger      No facility-administered medications prior to visit.    Review of Systems  Review of Systems  Constitutional:  Positive for fatigue.  Respiratory: Negative.    Psychiatric/Behavioral:  Positive for sleep disturbance.    Physical Exam  BP 120/70 (BP Location: Left Arm, Cuff Size: Normal)   Pulse 88   Temp 97.8 F (36.6 C) (Temporal)   Ht  (1.676 m)   Wt 199 lb 6.4 oz (90.4 kg)   SpO2 99%   BMI 32.18 kg/m  Physical Exam Constitutional:      Appearance: Normal appearance.  HENT:     Head: Normocephalic and atraumatic.     Mouth/Throat:     Comments: Mallampati class I Cardiovascular:     Rate and Rhythm: Normal rate and regular rhythm.  Pulmonary:     Effort: Pulmonary effort is normal.     Breath sounds: Normal breath sounds.  Musculoskeletal:        General: Normal range of motion.     Cervical back: Normal range of motion and neck supple.  Skin:    General: Skin is warm and dry.  Neurological:     General: No focal deficit present.     Mental Status: She is alert and oriented to person, place, and time. Mental status is at baseline.  Psychiatric:        Mood and Affect: Mood normal.        Behavior: Behavior normal.        Thought Content: Thought content normal.        Judgment: Judgment normal.      Lab Results:  CBC    Component Value Date/Time   WBC 8.6 08/04/2022 0736   RBC 4.16 08/04/2022 0736   HGB 11.9 (L) 08/04/2022 0736   HGB 11.9 03/29/2021 1455   HCT 37.4 08/04/2022 0736   HCT 35.4 03/29/2021 1455   PLT 457 (H) 08/04/2022 0736   PLT 374 03/29/2021 1455   MCV 89.9 08/04/2022 0736   MCV 86 03/29/2021 1455   MCH 28.6 08/04/2022 0736   MCHC 31.8 08/04/2022 0736   RDW 12.9 08/04/2022 0736   RDW 13.0  03/29/2021 1455   LYMPHSABS 1.8 04/11/2022 0919   LYMPHSABS 1.4 03/29/2021 1455   MONOABS 0.5 04/11/2022 0919   EOSABS 0.1 04/11/2022 0919   EOSABS 0.1 03/29/2021 1455   BASOSABS 0.0 04/11/2022 0919   BASOSABS 0.0 03/29/2021 1455    BMET    Component Value Date/Time   NA 141 08/04/2022 0736  NA 138 03/29/2021 1455   K 3.1 (L) 08/04/2022 0736   CL 103 08/04/2022 0736   CO2 27 08/04/2022 0736   GLUCOSE 143 (H) 08/04/2022 0736   BUN 17 08/04/2022 0736   BUN 13 03/29/2021 1455   CREATININE 1.21 (H) 08/04/2022 0736   CREATININE 0.64 03/17/2018 1036   CALCIUM 10.2 08/04/2022 0736   GFRNONAA 55 (L) 08/04/2022 0736   GFRAA >60 12/16/2019 1158    BNP    Component Value Date/Time   BNP 5.3 08/04/2022 0736    ProBNP    Component Value Date/Time   PROBNP 6 09/28/2019 1436    Imaging: No results found.   Assessment & Plan:   OSA (obstructive sleep apnea) - Diagnosed with moderate obstructive sleep apnea in 2020.  She remains on auto CPAP with reported clinical benefit in daytime sleepiness.  She has lost 40-60lbs. Lately she has been having difficulty tolerating CPAP and feels as if she is suffocating.  Current pressure settings 9-20 cm H2O (9.3cm h20-95%); Residual AHI 0.1/hour.  No significant air leaks. Recommend changing to set pressure 9cm h20 and asked that she adjust/try turning off ramp time. FU in 2 weeks. If no improvement consider mask fitting.    Glenford Bayley, NP 02/13/2023

## 2023-02-13 ENCOUNTER — Ambulatory Visit (INDEPENDENT_AMBULATORY_CARE_PROVIDER_SITE_OTHER): Payer: BC Managed Care – PPO | Admitting: Primary Care

## 2023-02-13 ENCOUNTER — Encounter: Payer: Self-pay | Admitting: Primary Care

## 2023-02-13 VITALS — BP 120/70 | HR 88 | Temp 97.8°F | Ht 66.0 in | Wt 199.4 lb

## 2023-02-13 DIAGNOSIS — G4733 Obstructive sleep apnea (adult) (pediatric): Secondary | ICD-10-CM | POA: Diagnosis not present

## 2023-02-13 NOTE — Assessment & Plan Note (Addendum)
-   Diagnosed with moderate obstructive sleep apnea in 2020.  She remains on auto CPAP with reported clinical benefit in daytime sleepiness.  She has lost 40-60lbs. Lately she has been having difficulty tolerating CPAP and feels as if she is suffocating.  Current pressure settings 9-20 cm H2O (9.3cm h20-95%); Residual AHI 0.1/hour.  No significant air leaks. Recommend changing to set pressure 9cm h20 and asked that she adjust/try turning off ramp time. FU in 2 weeks. If no improvement consider mask fitting.

## 2023-02-13 NOTE — Progress Notes (Signed)
Reviewed and agree with assessment/plan.   Dayanne Yiu, MD [facility] 02/13/2023, 12:06 PM Pager:  [phone]  

## 2023-03-04 ENCOUNTER — Other Ambulatory Visit: Payer: Self-pay

## 2023-03-24 ENCOUNTER — Telehealth (INDEPENDENT_AMBULATORY_CARE_PROVIDER_SITE_OTHER): Payer: BC Managed Care – PPO | Admitting: Primary Care

## 2023-03-24 DIAGNOSIS — G4733 Obstructive sleep apnea (adult) (pediatric): Secondary | ICD-10-CM

## 2023-03-24 NOTE — Patient Instructions (Signed)
No changes Continue CPAP at 9cm h20 Aim to get atleast 4 hours of usage a night  FU 1 year follow-up

## 2023-03-24 NOTE — Progress Notes (Signed)
Reviewed and agree with assessment/plan.   Coralyn Helling, MD [facility] 03/24/2023, 2:41 PM Pager:  [phone]

## 2023-03-24 NOTE — Progress Notes (Signed)
Virtual Visit via Video Note  I connected with Kathleen Burke on 03/24/23 at  2:00 PM EDT by a video enabled telemedicine application and verified that I am speaking with the correct person using two identifiers.  Location: Patient: Home Provider: Office    I discussed the limitations of evaluation and management by telemedicine and the availability of in person appointments. The patient expressed understanding and agreed to proceed.  History of Present Illness: 47 year old female, never smoked.  Past medical history significant for hypertension, OSA, GERD, anxiety/depression, hyperlipidemia, microcytic anemia.   Previous [facility] pulmonary encounter:  02/13/2023 Patient presents today for overdue follow-up for OSA.  Patient of Dr. Craige Cotta, last seen in December 2020. HST 04/11/19 >> AHI 16, SpO2 low 84% . She is maintained on CPAP auto settings 9 to 20 cm H2O. Lately having difficulty tolerating, she reports taking her CPAP off in the middle of the night. Feels like she is suffocating. She uses full face mask, size medium. Not experience much airleaks. She still reports benefit from wearing CPAP. She has morning headache and is tired if she does not wear CPAP at night.   Airview download 01/13/23-02/11/23 Usage 23/30 days (77%); 7 days (23%) >4 hours Averge usage days used 3 hours 5 mins Pressure 9-20cm h20 (9.3cm h20-95%) Airleaks 0.4L/min (95%) AHI 0.1  Sleep questionnaire Symptoms-   tired, headache, foggy Prior sleep study- 2020 Bedtime- 10pm Time to fall asleep- 20 mins Nocturnal awakenings- varies, 1-2 times  Out of bed/start of day- 5:30am-6am Weight changes- lost 60 lbs  Do you operate heavy machinery- no Do you currently wear CPAP- yes Do you current wear oxygen- yes [facility]- 11   03/24/2023 - Interim hx  Patient contacted today for for virtual visit, we were able to connect to video but audio was not working. We connected by televisit. During our last visit she reported difficulty  tolerating CPAP due to pressure settings. She reported weight loss since originaly sleep study. She still has snoring symptoms if she does not wear CPAP. We changed her from auto settings 9-20cm h20 to set pressure 9cm h20. She is doing well today, tolerating CPAP better. She is 100% compliance with CPAP use, average usage 4 hours 17 mins. She is falling asleep in the evenings on the couch watching TV before bed. She does not fall asleep at work.    Airview download 02/18/23-03/18/23 Usage days 29/29 days (100%); 16 days > 4 hours Average usage 4 hours 17 mins Pressure 9cm h20 Airleaks 0.3L/min AHI 0.1   Observations/Objective:  Appears well  Assessment and Plan:  OSA: - Sleep study in 2020 showed moderate OSA >> AHI 16/hour  - Changed from auto settings to set pressure and tolerating better   - Current pressure 9cm h20; Residual AHI 0.1/hour - No changes; encourage patient aim to wear CPAP nightly 4-6 hours or longer  Follow Up Instructions: - FU in 1 year or sooner if needed    I discussed the assessment and treatment plan with the patient. The patient was provided an opportunity to ask questions and all were answered. The patient agreed with the plan and demonstrated an understanding of the instructions.   The patient was advised to call back or seek an in-person evaluation if the symptoms worsen or if the condition fails to improve as anticipated.  I provided 22 minutes of non-face-to-face time during this encounter.   Glenford Bayley, NP

## 2023-04-07 ENCOUNTER — Other Ambulatory Visit: Payer: Self-pay | Admitting: Nurse Practitioner

## 2023-04-07 ENCOUNTER — Telehealth: Payer: Self-pay | Admitting: *Deleted

## 2023-04-07 DIAGNOSIS — Z1329 Encounter for screening for other suspected endocrine disorder: Secondary | ICD-10-CM

## 2023-04-07 DIAGNOSIS — R7303 Prediabetes: Secondary | ICD-10-CM

## 2023-04-07 DIAGNOSIS — E559 Vitamin D deficiency, unspecified: Secondary | ICD-10-CM

## 2023-04-07 DIAGNOSIS — I10 Essential (primary) hypertension: Secondary | ICD-10-CM

## 2023-04-07 DIAGNOSIS — Z0000 Encounter for general adult medical examination without abnormal findings: Secondary | ICD-10-CM

## 2023-04-07 DIAGNOSIS — E785 Hyperlipidemia, unspecified: Secondary | ICD-10-CM

## 2023-04-07 NOTE — Telephone Encounter (Signed)
Please place future orders for lab appt.  

## 2023-04-09 ENCOUNTER — Other Ambulatory Visit (INDEPENDENT_AMBULATORY_CARE_PROVIDER_SITE_OTHER): Payer: Commercial Managed Care - PPO

## 2023-04-09 DIAGNOSIS — E785 Hyperlipidemia, unspecified: Secondary | ICD-10-CM

## 2023-04-09 DIAGNOSIS — Z1329 Encounter for screening for other suspected endocrine disorder: Secondary | ICD-10-CM | POA: Diagnosis not present

## 2023-04-09 DIAGNOSIS — I10 Essential (primary) hypertension: Secondary | ICD-10-CM

## 2023-04-09 DIAGNOSIS — E559 Vitamin D deficiency, unspecified: Secondary | ICD-10-CM | POA: Diagnosis not present

## 2023-04-09 DIAGNOSIS — R7303 Prediabetes: Secondary | ICD-10-CM

## 2023-04-09 LAB — CBC WITH DIFFERENTIAL/PLATELET
Basophils Absolute: 0 10*3/uL (ref 0.0–0.1)
Basophils Relative: 0.4 % (ref 0.0–3.0)
Eosinophils Absolute: 0.1 10*3/uL (ref 0.0–0.7)
Eosinophils Relative: 2.1 % (ref 0.0–5.0)
HCT: 37.3 % (ref 36.0–46.0)
Hemoglobin: 12.3 g/dL (ref 12.0–15.0)
Lymphocytes Relative: 33.2 % (ref 12.0–46.0)
Lymphs Abs: 1.9 10*3/uL (ref 0.7–4.0)
MCHC: 33 g/dL (ref 30.0–36.0)
MCV: 88.5 fl (ref 78.0–100.0)
Monocytes Absolute: 0.4 10*3/uL (ref 0.1–1.0)
Monocytes Relative: 7.6 % (ref 3.0–12.0)
Neutro Abs: 3.3 10*3/uL (ref 1.4–7.7)
Neutrophils Relative %: 56.7 % (ref 43.0–77.0)
Platelets: 346 10*3/uL (ref 150.0–400.0)
RBC: 4.22 Mil/uL (ref 3.87–5.11)
RDW: 13.9 % (ref 11.5–15.5)
WBC: 5.8 10*3/uL (ref 4.0–10.5)

## 2023-04-09 LAB — LIPID PANEL
Cholesterol: 163 mg/dL (ref 0–200)
HDL: 56.9 mg/dL (ref >39.00)
LDL Cholesterol: 96 mg/dL (ref 0–99)
NonHDL: 106.37
Total CHOL/HDL Ratio: 3
Triglycerides: 52 mg/dL (ref 0.0–149.0)
VLDL: 10.4 mg/dL (ref 0.0–40.0)

## 2023-04-09 LAB — COMPREHENSIVE METABOLIC PANEL
ALT: 18 U/L (ref 0–35)
AST: 20 U/L (ref 0–37)
Albumin: 4.3 g/dL (ref 3.5–5.2)
Alkaline Phosphatase: 67 U/L (ref 39–117)
BUN: 13 mg/dL (ref 6–23)
CO2: 27 mEq/L (ref 19–32)
Calcium: 9.2 mg/dL (ref 8.4–10.5)
Chloride: 102 mEq/L (ref 96–112)
Creatinine, Ser: 0.94 mg/dL (ref 0.40–1.20)
GFR: 71.5 mL/min (ref >60.00)
Glucose, Bld: 88 mg/dL (ref 70–99)
Potassium: 4 mEq/L (ref 3.5–5.1)
Sodium: 138 mEq/L (ref 135–145)
Total Bilirubin: 0.4 mg/dL (ref 0.2–1.2)
Total Protein: 7.5 g/dL (ref 6.0–8.3)

## 2023-04-09 LAB — TSH: TSH: 1.37 u[IU]/mL (ref 0.35–5.50)

## 2023-04-09 LAB — HEMOGLOBIN A1C: Hgb A1c MFr Bld: 5.3 % (ref 4.6–6.5)

## 2023-04-09 LAB — VITAMIN D 25 HYDROXY (VIT D DEFICIENCY, FRACTURES): VITD: 42.78 ng/mL (ref 30.00–100.00)

## 2023-04-14 ENCOUNTER — Encounter: Payer: Self-pay | Admitting: Nurse Practitioner

## 2023-04-14 ENCOUNTER — Ambulatory Visit (INDEPENDENT_AMBULATORY_CARE_PROVIDER_SITE_OTHER): Payer: Commercial Managed Care - PPO | Admitting: Nurse Practitioner

## 2023-04-14 VITALS — BP 120/70 | HR 78 | Temp 98.3°F | Ht 66.0 in | Wt 202.0 lb

## 2023-04-14 DIAGNOSIS — Z1231 Encounter for screening mammogram for malignant neoplasm of breast: Secondary | ICD-10-CM

## 2023-04-14 DIAGNOSIS — R7303 Prediabetes: Secondary | ICD-10-CM | POA: Diagnosis not present

## 2023-04-14 DIAGNOSIS — E559 Vitamin D deficiency, unspecified: Secondary | ICD-10-CM

## 2023-04-14 DIAGNOSIS — Z0000 Encounter for general adult medical examination without abnormal findings: Secondary | ICD-10-CM

## 2023-04-14 NOTE — Assessment & Plan Note (Signed)
Stable in most recent lab work on daily OTC supplement. Continue.

## 2023-04-14 NOTE — Assessment & Plan Note (Signed)
Physical exam complete. Lab work results reviewed with patient. Pap- UTD. Colonoscopy- UTD. Mammogram- due in October, order placed. Encouraged patient to call to schedule. Flu vaccine- UTD. Tetanus vaccine- UTD. Declined additional COVID vaccines. HIV screening deferred. Hep C screening- negative. Recommended follow ups with Dentist and Ophthalmology for annual exams. Encouraged to continue healthy diet and exercise. Return to care in 6 months, sooner PRN.

## 2023-04-14 NOTE — Progress Notes (Signed)
Bethanie Dicker, NP-C Phone: [phone]  Kathleen Burke is a 47 y.o. female who presents today for annual exam. She has no complaints or new concerns today. She completed her lab work prior to her appointment and would like to review the results. She is doing well on all of her medications.   Diet: Fair- bland diet, increased vegetables, no soda, likes sweets Exercise: Walking 3-4 days per week Pap smear: 05/02/2022 Colonoscopy: 07/18/2019- 10 year recall Mammogram: 08/18/2022- Due in October Family history-  Colon cancer: No  Breast cancer: Yes, maternal grandmother  Ovarian cancer: No Menses: IUD- occasional spotting Sexually active: Yes Vaccines-   Flu: UTD  Tetanus: 08/20/2020  COVID19: x 5 HIV screening: Deferred Hep C Screening: Negative Tobacco use: No Alcohol use: Yes, socially on the weekends Illicit Drug use: No Dentist: Yes Ophthalmology: Yes  Social History   Tobacco Use  Smoking Status Never  Smokeless Tobacco Never    Current Outpatient Medications on File Prior to Visit  Medication Sig Dispense Refill   Accu-Chek FastClix Lancets MISC USE AS DIRECTED ONCE A DAY 102 each 12   ACCU-CHEK GUIDE test strip USE AS DIRECTED TO CHECK SUGAR ONCE A DAY 100 strip 3   ALPRAZolam (XANAX) 0.5 MG tablet Take 1 tablet (0.5 mg total) by mouth daily as needed for anxiety. 30 tablet 0   b complex vitamins capsule Take 1 capsule by mouth daily.     Cholecalciferol (VITAMIN D3) 125 MCG (5000 UT) CAPS Take 5,000 Units by mouth daily.     Cyanocobalamin (B-12) 1000 MCG TABS Take 1,000 mcg by mouth daily.     docusate sodium (COLACE) 100 MG capsule Take 100 mg by mouth daily.      ELDERBERRY PO Take 1 each by mouth 2 (two) times daily. Gummies     ferrous sulfate 325 (65 FE) MG tablet Take 325 mg by mouth daily with breakfast.      levonorgestrel (MIRENA, 52 MG,) 20 MCG/DAY IUD 1 each by Intrauterine route once.     metoprolol succinate (TOPROL XL) 50 MG 24 hr tablet Take 1  tablet (50 mg total) by mouth daily. Take with or immediately following a meal. D/C HCTZ and Toprol 25 90 tablet 3   pravastatin (PRAVACHOL) 20 MG tablet Take 1 tablet (20 mg total) by mouth daily. After 6 pm 90 tablet 3   Semaglutide, 2 MG/DOSE, 8 MG/3ML SOPN Inject 2 mg as directed once a week. 9 mL 3   TURMERIC PO Take 500 mg by mouth daily. With ginger      No current facility-administered medications on file prior to visit.    ROS see history of present illness  Objective  Physical Exam Vitals:   04/14/23 0811  BP: 120/70  Pulse: 78  Temp: 98.3 F (36.8 C)  SpO2: 99%    BP Readings from Last 3 Encounters:  04/14/23 120/70  02/13/23 120/70  01/06/23 126/82   Wt Readings from Last 3 Encounters:  04/14/23 202 lb (91.6 kg)  02/13/23 199 lb 6.4 oz (90.4 kg)  01/06/23 199 lb 3.2 oz (90.4 kg)    Physical Exam Constitutional:      General: She is not in acute distress.    Appearance: Normal appearance.  HENT:     Head: Normocephalic.     Right Ear: Tympanic membrane normal.     Left Ear: Tympanic membrane normal.     Nose: Nose normal.     Mouth/Throat:     Mouth:  Mucous membranes are moist.     Pharynx: Oropharynx is clear.  Eyes:     Conjunctiva/sclera: Conjunctivae normal.     Pupils: Pupils are equal, round, and reactive to light.  Neck:     Thyroid: No thyromegaly.  Cardiovascular:     Rate and Rhythm: Normal rate and regular rhythm.     Heart sounds: Normal heart sounds.  Pulmonary:     Effort: Pulmonary effort is normal.     Breath sounds: Normal breath sounds.  Abdominal:     General: Abdomen is flat. Bowel sounds are normal.     Palpations: Abdomen is soft. There is no mass.     Tenderness: There is no abdominal tenderness.  Musculoskeletal:        General: Normal range of motion.  Lymphadenopathy:     Cervical: No cervical adenopathy.  Skin:    General: Skin is warm and dry.     Findings: No rash.  Neurological:     General: No focal deficit  present.     Mental Status: She is alert.  Psychiatric:        Mood and Affect: Mood normal.        Behavior: Behavior normal.    Assessment/Plan: Please see individual problem list.  Preventative health care Assessment & Plan: Physical exam complete. Lab work results reviewed with patient. Pap- UTD. Colonoscopy- UTD. Mammogram- due in October, order placed. Encouraged patient to call to schedule. Flu vaccine- UTD. Tetanus vaccine- UTD. Declined additional COVID vaccines. HIV screening deferred. Hep C screening- negative. Recommended follow ups with Dentist and Ophthalmology for annual exams. Encouraged to continue healthy diet and exercise. Return to care in 6 months, sooner PRN.    Prediabetes Assessment & Plan: Improvement in A1c from 6.0 to 5.3 in most recent lab work. Continue Ozempic 2 mg weekly. Denies any problems with hypoglycemia. Encouraged to continue healthy diet and exercise. Will monitor.   Vitamin D deficiency Assessment & Plan: Stable in most recent lab work on daily OTC supplement. Continue.    Screening mammogram for breast cancer -     3D Screening Mammogram, Left and Right; Future   Return in about 6 months (around 10/14/2023) for Follow up.   Bethanie Dicker, NP-C [facility] [employer]

## 2023-04-14 NOTE — Assessment & Plan Note (Addendum)
Improvement in A1c from 6.0 to 5.3 in most recent lab work. Continue Ozempic 2 mg weekly. Denies any problems with hypoglycemia. Encouraged to continue healthy diet and exercise. Will monitor.

## 2023-04-14 NOTE — Patient Instructions (Signed)
YOUR MAMMOGRAM IS DUE, PLEASE CALL AND GET THIS SCHEDULED! [facility] - call [phone]    

## 2023-05-08 ENCOUNTER — Other Ambulatory Visit: Payer: Self-pay | Admitting: Oncology

## 2023-05-08 DIAGNOSIS — Z006 Encounter for examination for normal comparison and control in clinical research program: Secondary | ICD-10-CM

## 2023-06-18 ENCOUNTER — Other Ambulatory Visit (HOSPITAL_COMMUNITY): Payer: Self-pay

## 2023-06-23 ENCOUNTER — Other Ambulatory Visit: Payer: Self-pay

## 2023-06-23 ENCOUNTER — Other Ambulatory Visit (HOSPITAL_BASED_OUTPATIENT_CLINIC_OR_DEPARTMENT_OTHER): Payer: Self-pay

## 2023-07-13 ENCOUNTER — Other Ambulatory Visit: Payer: Self-pay

## 2023-07-13 ENCOUNTER — Encounter: Payer: Self-pay | Admitting: Nurse Practitioner

## 2023-07-13 ENCOUNTER — Other Ambulatory Visit: Payer: Self-pay | Admitting: Nurse Practitioner

## 2023-07-13 DIAGNOSIS — E785 Hyperlipidemia, unspecified: Secondary | ICD-10-CM

## 2023-07-13 DIAGNOSIS — R7303 Prediabetes: Secondary | ICD-10-CM

## 2023-07-13 DIAGNOSIS — I10 Essential (primary) hypertension: Secondary | ICD-10-CM

## 2023-07-13 DIAGNOSIS — F40243 Fear of flying: Secondary | ICD-10-CM

## 2023-07-13 MED ORDER — PRAVASTATIN SODIUM 20 MG PO TABS
20.0000 mg | ORAL_TABLET | Freq: Every day | ORAL | 3 refills | Status: DC
Start: 2023-07-13 — End: 2024-04-18
  Filled 2023-07-13: qty 90, 90d supply, fill #0
  Filled 2023-10-10: qty 90, 90d supply, fill #1
  Filled 2024-01-13: qty 90, 90d supply, fill #2
  Filled 2024-04-08: qty 90, 90d supply, fill #3

## 2023-07-13 MED ORDER — SEMAGLUTIDE (2 MG/DOSE) 8 MG/3ML ~~LOC~~ SOPN
2.0000 mg | PEN_INJECTOR | SUBCUTANEOUS | 3 refills | Status: DC
Start: 2023-07-13 — End: 2024-08-08
  Filled 2023-07-13 – 2023-07-15 (×4): qty 3, 28d supply, fill #0
  Filled 2023-07-20: qty 3, 1d supply, fill #0
  Filled 2023-07-20 (×2): qty 3, 28d supply, fill #0
  Filled 2023-08-15: qty 3, 28d supply, fill #1
  Filled 2023-09-12: qty 3, 28d supply, fill #2
  Filled 2023-10-10: qty 3, 28d supply, fill #3
  Filled 2023-11-09: qty 3, 28d supply, fill #4
  Filled 2023-12-09: qty 3, 28d supply, fill #5
  Filled 2024-01-03: qty 3, 28d supply, fill #6
  Filled 2024-01-31: qty 3, 28d supply, fill #7
  Filled 2024-02-27: qty 3, 28d supply, fill #8
  Filled 2024-04-01: qty 3, 28d supply, fill #9
  Filled 2024-04-25: qty 3, 28d supply, fill #10
  Filled 2024-05-23: qty 3, 28d supply, fill #11
  Filled ????-??-??: fill #0

## 2023-07-13 MED ORDER — METOPROLOL SUCCINATE ER 50 MG PO TB24
50.0000 mg | ORAL_TABLET | Freq: Every day | ORAL | 3 refills | Status: DC
Start: 2023-07-13 — End: 2024-07-05
  Filled 2023-07-13: qty 90, 90d supply, fill #0
  Filled 2023-10-10: qty 90, 90d supply, fill #1
  Filled 2024-01-13: qty 90, 90d supply, fill #2
  Filled 2024-04-08: qty 90, 90d supply, fill #3

## 2023-07-13 NOTE — Telephone Encounter (Signed)
Patient just called and needs a refill on her medications. The first one is metoprolol succinate (TOPROL XL) 50 MG 24 hr tablet, pravastatin (PRAVACHOL) 20 MG tablet, and Semaglutide, 2 MG/DOSE, 8 MG/3ML SOPN. The pharmacy she uses is [facility]

## 2023-07-14 ENCOUNTER — Other Ambulatory Visit: Payer: Self-pay

## 2023-07-14 MED ORDER — ALPRAZOLAM 0.5 MG PO TABS
0.5000 mg | ORAL_TABLET | Freq: Every day | ORAL | 0 refills | Status: DC | PRN
Start: 2023-07-14 — End: 2023-11-25
  Filled 2023-07-24: qty 30, 30d supply, fill #0

## 2023-07-14 NOTE — Telephone Encounter (Signed)
Requesting: Alprazolam Contract: Yes UDS: No Last Visit: 04/14/2023 Next Visit: 10/14/2023 Last Refill: 01/06/2023  Please Advise

## 2023-07-15 ENCOUNTER — Other Ambulatory Visit (HOSPITAL_COMMUNITY): Payer: Self-pay

## 2023-07-15 ENCOUNTER — Other Ambulatory Visit: Payer: Self-pay

## 2023-07-16 ENCOUNTER — Other Ambulatory Visit (HOSPITAL_COMMUNITY): Payer: Self-pay

## 2023-07-16 ENCOUNTER — Telehealth: Payer: Self-pay

## 2023-07-16 NOTE — Telephone Encounter (Signed)
Pharmacy Patient Advocate Encounter   Received notification from CoverMyMeds that prior authorization for Ozempic is required/requested.   Insurance verification completed.   The patient is insured through [facility] .   Per test claim: PA required; PA submitted to [place] via CoverMyMeds Key/confirmation #/EOC Key: [id number] Status is pending

## 2023-07-20 ENCOUNTER — Other Ambulatory Visit: Payer: Self-pay

## 2023-07-20 NOTE — Telephone Encounter (Signed)
Pharmacy Patient Advocate Encounter  Received notification from [facility] that Prior Authorization for Ozempic 8mg /6ml has been APPROVED from 07/14/23 to 07/15/24

## 2023-07-20 NOTE — Telephone Encounter (Signed)
Pt informed via mychart

## 2023-07-21 ENCOUNTER — Other Ambulatory Visit: Payer: Self-pay

## 2023-07-24 ENCOUNTER — Other Ambulatory Visit: Payer: Self-pay

## 2023-09-11 ENCOUNTER — Other Ambulatory Visit: Payer: Self-pay

## 2023-10-08 ENCOUNTER — Ambulatory Visit
Admission: RE | Admit: 2023-10-08 | Discharge: 2023-10-08 | Disposition: A | Discharge status: Home / Self Care | Payer: Commercial Managed Care - PPO | Source: Ambulatory Visit | Attending: Nurse Practitioner | Admitting: Nurse Practitioner

## 2023-10-08 DIAGNOSIS — Z1231 Encounter for screening mammogram for malignant neoplasm of breast: Secondary | ICD-10-CM | POA: Diagnosis not present

## 2023-10-14 ENCOUNTER — Other Ambulatory Visit: Payer: Self-pay

## 2023-10-14 ENCOUNTER — Other Ambulatory Visit
Admission: RE | Admit: 2023-10-14 | Discharge: 2023-10-14 | Disposition: A | Discharge status: Home / Self Care | Payer: Self-pay | Source: Ambulatory Visit | Attending: Oncology | Admitting: Oncology

## 2023-10-14 ENCOUNTER — Ambulatory Visit: Payer: Commercial Managed Care - PPO | Admitting: Nurse Practitioner

## 2023-10-14 VITALS — BP 116/78 | HR 79 | Temp 98.0°F | Ht 66.0 in | Wt 199.2 lb

## 2023-10-14 DIAGNOSIS — F32A Depression, unspecified: Secondary | ICD-10-CM | POA: Diagnosis not present

## 2023-10-14 DIAGNOSIS — R7303 Prediabetes: Secondary | ICD-10-CM | POA: Diagnosis not present

## 2023-10-14 DIAGNOSIS — F419 Anxiety disorder, unspecified: Secondary | ICD-10-CM

## 2023-10-14 DIAGNOSIS — E785 Hyperlipidemia, unspecified: Secondary | ICD-10-CM

## 2023-10-14 DIAGNOSIS — I10 Essential (primary) hypertension: Secondary | ICD-10-CM | POA: Diagnosis not present

## 2023-10-14 DIAGNOSIS — Z006 Encounter for examination for normal comparison and control in clinical research program: Secondary | ICD-10-CM | POA: Insufficient documentation

## 2023-10-14 MED ORDER — ESCITALOPRAM OXALATE 10 MG PO TABS
10.0000 mg | ORAL_TABLET | Freq: Every day | ORAL | 0 refills | Status: DC
Start: 2023-10-14 — End: 2023-11-25
  Filled 2023-10-14: qty 90, 90d supply, fill #0

## 2023-10-14 NOTE — Assessment & Plan Note (Signed)
She reports irritability and mood swings but no hot flashes, with an IUD in place causing only spotting for menses. We will start Lexapro 10 mg daily for mood symptoms and potential symptoms of perimenopause. She will continue Xanax 0.5 mg as needed for anxiety. Counseled patient on common side effects. Encouraged to contact if worsening symptoms, unusual behavior changes or suicidal thoughts occur. She will follow up in 6 weeks to assess mood and medication effectiveness.

## 2023-10-14 NOTE — Progress Notes (Signed)
Bethanie Dicker, NP-C Phone: [phone]  Kathleen Burke is a 47 y.o. female who presents today for follow up.   Discussed the use of AI scribe software for clinical note transcription with the patient, who gave verbal consent to proceed.  History of Present Illness   The patient, with a history of prediabetes, hypertension, and hyperlipidemia, presents with concerns of irritability and mood swings, suspecting she may be perimenopausal. She reports a recent increase in irritability, particularly towards mundane tasks, and a decrease in motivation, leading to a reduction in her usual exercise routine. She denies experiencing hot flashes, but notes the use of a heating blanket. The patient has an IUD in place and reports only spotting for menstrual cycles. She has an upcoming appointment with her OB/GYN to remove the IUD.  The patient also reports a history of anxiety, currently managed with as-needed Xanax. She has previously been on an unspecified antidepressant, which she recalls made her feel hot but improved her mood. She expresses willingness to consider another course of antidepressant treatment.  The patient's other chronic conditions appear well-managed. She reports no chest pain, shortness of breath, dizziness, or swelling. She is currently on metoprolol XL 50mg  for hypertension, pravastatin 20mg  for hyperlipidemia, and Ozempic 2mg  weekly for prediabetes. She reports no muscle aches or abdominal pain, but does note chronic arm pain, which she attributes to her sleeping position. Her blood pressure readings at home are consistently in the 110s/70s, and her blood glucose readings are typically in the 80s to 90s. Her most recent A1C was 5.3.      Social History   Tobacco Use  Smoking Status Never  Smokeless Tobacco Never    Current Outpatient Medications on File Prior to Visit  Medication Sig Dispense Refill   Accu-Chek FastClix Lancets MISC USE AS DIRECTED ONCE A DAY 102 each 12    ACCU-CHEK GUIDE test strip USE AS DIRECTED TO CHECK SUGAR ONCE A DAY 100 strip 3   ALPRAZolam (XANAX) 0.5 MG tablet Take 1 tablet (0.5 mg total) by mouth daily as needed for anxiety. 30 tablet 0   b complex vitamins capsule Take 1 capsule by mouth daily.     Cholecalciferol (VITAMIN D3) 125 MCG (5000 UT) CAPS Take 5,000 Units by mouth daily.     Cyanocobalamin (B-12) 1000 MCG TABS Take 1,000 mcg by mouth daily.     docusate sodium (COLACE) 100 MG capsule Take 100 mg by mouth daily.      ELDERBERRY PO Take 1 each by mouth 2 (two) times daily. Gummies     ferrous sulfate 325 (65 FE) MG tablet Take 325 mg by mouth daily with breakfast.      levonorgestrel (MIRENA, 52 MG,) 20 MCG/DAY IUD 1 each by Intrauterine route once.     metoprolol succinate (TOPROL XL) 50 MG 24 hr tablet Take 1 tablet (50 mg total) by mouth daily. Take with or immediately following a meal. Stop HCTZ and Toprol 25 90 tablet 3   pravastatin (PRAVACHOL) 20 MG tablet Take 1 tablet (20 mg total) by mouth daily after 6 pm. 90 tablet 3   Semaglutide, 2 MG/DOSE, 8 MG/3ML SOPN Inject 2 mg into the skin once a week. 9 mL 3   TURMERIC PO Take 500 mg by mouth daily. With ginger      No current facility-administered medications on file prior to visit.    ROS see history of present illness  Objective  Physical Exam Vitals:   10/14/23 0806  BP:  116/78  Pulse: 79  Temp: 98 F (36.7 C)  SpO2: 97%    BP Readings from Last 3 Encounters:  10/14/23 116/78  04/14/23 120/70  02/13/23 120/70   Wt Readings from Last 3 Encounters:  10/14/23 199 lb 3.2 oz (90.4 kg)  04/14/23 202 lb (91.6 kg)  02/13/23 199 lb 6.4 oz (90.4 kg)    Physical Exam Constitutional:      General: She is not in acute distress.    Appearance: Normal appearance.  HENT:     Head: Normocephalic.  Cardiovascular:     Rate and Rhythm: Normal rate and regular rhythm.     Heart sounds: Normal heart sounds.  Pulmonary:     Effort: Pulmonary effort is normal.      Breath sounds: Normal breath sounds.  Skin:    General: Skin is warm and dry.  Neurological:     General: No focal deficit present.     Mental Status: She is alert.  Psychiatric:        Mood and Affect: Mood normal.        Behavior: Behavior normal.    Assessment/Plan: Please see individual problem list..  Anxiety and depression Assessment & Plan: She reports irritability and mood swings but no hot flashes, with an IUD in place causing only spotting for menses. We will start Lexapro 10 mg daily for mood symptoms and potential symptoms of perimenopause. She will continue Xanax 0.5 mg as needed for anxiety. Counseled patient on common side effects. Encouraged to contact if worsening symptoms, unusual behavior changes or suicidal thoughts occur. She will follow up in 6 weeks to assess mood and medication effectiveness.  Orders: -     Escitalopram Oxalate; Take 1 tablet (10 mg total) by mouth daily.  Dispense: 90 tablet; Refill: 0  Primary hypertension Assessment & Plan: Her hypertension is well controlled on Toprol XL 50mg  daily. We will continue the current regimen. Follow up with Cardiology as scheduled.    Hyperlipidemia, unspecified hyperlipidemia type Assessment & Plan: Hyperlipidemia is well controlled on Pravastatin 20mg  daily. We will continue the current regimen.   Prediabetes Assessment & Plan: Her prediabetes is well controlled on Ozempic 2mg  weekly, with no symptoms of hyperglycemia or hypoglycemia. We will continue the current regimen. Last A1c- 5.3     Return in about 6 weeks (around 11/25/2023) for Anxiety/Depression, then 6 month follow up.   Bethanie Dicker, NP-C [facility] [employer]

## 2023-10-14 NOTE — Assessment & Plan Note (Signed)
Her prediabetes is well controlled on Ozempic 2mg  weekly, with no symptoms of hyperglycemia or hypoglycemia. We will continue the current regimen. Last A1c- 5.3

## 2023-10-14 NOTE — Assessment & Plan Note (Addendum)
Her hypertension is well controlled on Toprol XL 50mg  daily. We will continue the current regimen. Follow up with Cardiology as scheduled.

## 2023-10-14 NOTE — Assessment & Plan Note (Signed)
Hyperlipidemia is well controlled on Pravastatin 20mg  daily. We will continue the current regimen.

## 2023-10-17 DIAGNOSIS — G4733 Obstructive sleep apnea (adult) (pediatric): Secondary | ICD-10-CM | POA: Diagnosis not present

## 2023-10-26 LAB — GENECONNECT MOLECULAR SCREEN: Genetic Analysis Overall Interpretation: NEGATIVE

## 2023-11-09 ENCOUNTER — Other Ambulatory Visit: Payer: Self-pay

## 2023-11-25 ENCOUNTER — Encounter: Payer: Self-pay | Admitting: Nurse Practitioner

## 2023-11-25 ENCOUNTER — Other Ambulatory Visit: Payer: Self-pay

## 2023-11-25 ENCOUNTER — Ambulatory Visit (INDEPENDENT_AMBULATORY_CARE_PROVIDER_SITE_OTHER): Payer: Commercial Managed Care - PPO | Admitting: Nurse Practitioner

## 2023-11-25 VITALS — BP 122/80 | HR 75 | Temp 98.1°F | Ht 66.0 in | Wt 198.2 lb

## 2023-11-25 DIAGNOSIS — F40243 Fear of flying: Secondary | ICD-10-CM

## 2023-11-25 DIAGNOSIS — F419 Anxiety disorder, unspecified: Secondary | ICD-10-CM | POA: Diagnosis not present

## 2023-11-25 DIAGNOSIS — F32A Depression, unspecified: Secondary | ICD-10-CM

## 2023-11-25 MED ORDER — ESCITALOPRAM OXALATE 10 MG PO TABS
10.0000 mg | ORAL_TABLET | Freq: Every day | ORAL | 3 refills | Status: DC
  Filled 2023-11-25 – 2024-01-05 (×3): qty 90, 90d supply, fill #0
  Filled 2024-02-19: qty 90, 90d supply, fill #1

## 2023-11-25 MED ORDER — ALPRAZOLAM 0.5 MG PO TABS
0.5000 mg | ORAL_TABLET | Freq: Every day | ORAL | 0 refills | Status: AC | PRN
  Filled 2023-11-25: qty 30, 30d supply, fill #0

## 2023-11-25 NOTE — Progress Notes (Signed)
Bethanie Dicker, NP-C Phone: [phone]  Kathleen Burke is a 48 y.o. female who presents today for follow up.   Discussed the use of AI scribe software for clinical note transcription with the patient, who gave verbal consent to proceed.  History of Present Illness   The patient presents for a six-week follow-up on mood management with Lexapro.  She started Lexapro six weeks ago and finds it effective, noting increased motivation and decreased 'foggy headedness'. Her anxiety has improved slightly, though it has increased due to a recent death in the family.   Regarding mood swings and irritability, she feels these symptoms have improved. She takes Lexapro at night and reports it helps her sleep well.  She is planning a trip to [place] in May and has requested a refill of Xanax for this purpose.      Social History   Tobacco Use  Smoking Status Never  Smokeless Tobacco Never    Current Outpatient Medications on File Prior to Visit  Medication Sig Dispense Refill   Accu-Chek FastClix Lancets MISC USE AS DIRECTED ONCE A DAY 102 each 12   ACCU-CHEK GUIDE test strip USE AS DIRECTED TO CHECK SUGAR ONCE A DAY 100 strip 3   b complex vitamins capsule Take 1 capsule by mouth daily.     Cholecalciferol (VITAMIN D3) 125 MCG (5000 UT) CAPS Take 5,000 Units by mouth daily.     Cyanocobalamin (B-12) 1000 MCG TABS Take 1,000 mcg by mouth daily.     docusate sodium (COLACE) 100 MG capsule Take 100 mg by mouth daily.      ELDERBERRY PO Take 1 each by mouth 2 (two) times daily. Gummies     ferrous sulfate 325 (65 FE) MG tablet Take 325 mg by mouth daily with breakfast.      levonorgestrel (MIRENA, 52 MG,) 20 MCG/DAY IUD 1 each by Intrauterine route once.     metoprolol succinate (TOPROL XL) 50 MG 24 hr tablet Take 1 tablet (50 mg total) by mouth daily. Take with or immediately following a meal. Stop HCTZ and Toprol 25 90 tablet 3   pravastatin (PRAVACHOL) 20 MG tablet Take 1 tablet (20 mg total) by  mouth daily after 6 pm. 90 tablet 3   Semaglutide, 2 MG/DOSE, 8 MG/3ML SOPN Inject 2 mg into the skin once a week. 9 mL 3   TURMERIC PO Take 500 mg by mouth daily. With ginger      No current facility-administered medications on file prior to visit.    ROS see history of present illness  Objective  Physical Exam Vitals:   11/25/23 0807  BP: 122/80  Pulse: 75  Temp: 98.1 F (36.7 C)  SpO2: 98%    BP Readings from Last 3 Encounters:  11/25/23 122/80  10/14/23 116/78  04/14/23 120/70   Wt Readings from Last 3 Encounters:  11/25/23 198 lb 3.2 oz (89.9 kg)  10/14/23 199 lb 3.2 oz (90.4 kg)  04/14/23 202 lb (91.6 kg)    Physical Exam Constitutional:      General: She is not in acute distress.    Appearance: Normal appearance.  HENT:     Head: Normocephalic.  Cardiovascular:     Rate and Rhythm: Normal rate and regular rhythm.     Heart sounds: Normal heart sounds.  Pulmonary:     Effort: Pulmonary effort is normal.     Breath sounds: Normal breath sounds.  Skin:    General: Skin is warm and dry.  Neurological:  General: No focal deficit present.     Mental Status: She is alert.  Psychiatric:        Mood and Affect: Mood normal.        Behavior: Behavior normal.     Assessment/Plan: Please see individual problem list.  Anxiety and depression Assessment & Plan: Mood and motivation have improved on Lexapro 10mg  daily, though recent family loss has heightened anxiety. PHQ and GAD both 0 today. Continue Lexapro at the current dose. Consider increasing the dose if symptoms return or worsen. Encouraged to contact if worsening symptoms, unusual behavior changes or suicidal thoughts occur.   Orders: -     Escitalopram Oxalate; Take 1 tablet (10 mg total) by mouth daily.  Dispense: 90 tablet; Refill: 3  Anxiety with flying Assessment & Plan: Refill Xanax for upcoming travel. PDMP reviewed.   Orders: -     ALPRAZolam; Take 1 tablet (0.5 mg total) by mouth daily  as needed for anxiety.  Dispense: 30 tablet; Refill: 0    Return in about 5 months (around 04/18/2024) for Annual Exam, sooner as needed.   Bethanie Dicker, NP-C [facility] - [facility]

## 2023-11-25 NOTE — Assessment & Plan Note (Signed)
Refill Xanax for upcoming travel. PDMP reviewed.

## 2023-11-25 NOTE — Assessment & Plan Note (Signed)
Mood and motivation have improved on Lexapro 10mg  daily, though recent family loss has heightened anxiety. PHQ and GAD both 0 today. Continue Lexapro at the current dose. Consider increasing the dose if symptoms return or worsen. Encouraged to contact if worsening symptoms, unusual behavior changes or suicidal thoughts occur.

## 2023-12-04 ENCOUNTER — Encounter: Payer: BC Managed Care – PPO | Admitting: Radiology

## 2023-12-08 ENCOUNTER — Other Ambulatory Visit (HOSPITAL_BASED_OUTPATIENT_CLINIC_OR_DEPARTMENT_OTHER): Payer: Self-pay

## 2023-12-24 ENCOUNTER — Other Ambulatory Visit: Payer: Self-pay

## 2023-12-24 ENCOUNTER — Telehealth: Payer: Self-pay

## 2023-12-24 NOTE — Telephone Encounter (Signed)
 I left voicemail for patient asking her to please call us back to reschedule her appointment on 04/26/2024 with Bethanie Dicker, NP, as she will not be in the office on that day.  I noticed that patient has an appointment schedule with Bethanie Dicker, NP, on 04/13/2024.  I left another voicemail for patient letting her know that she already has an appointment scheduled with Bethanie Dicker, NP, on 04/13/2024, so the appointment on 04/26/2024 is no longer needed.  I let her know that I will go ahead and cancel the appointment on 04/26/2024.  I let patient know that she does not need to call us back unless she has questions.

## 2024-01-05 ENCOUNTER — Other Ambulatory Visit: Payer: Self-pay

## 2024-01-14 ENCOUNTER — Ambulatory Visit: Payer: BC Managed Care – PPO | Admitting: Radiology

## 2024-01-14 ENCOUNTER — Encounter: Payer: Self-pay | Admitting: Radiology

## 2024-01-14 VITALS — BP 124/80 | HR 88 | Ht 65.5 in | Wt 205.4 lb

## 2024-01-14 DIAGNOSIS — N921 Excessive and frequent menstruation with irregular cycle: Secondary | ICD-10-CM | POA: Diagnosis not present

## 2024-01-14 NOTE — Progress Notes (Signed)
   Kathleen Burke 01-08-1976 [id number]   History:  48 y.o. G2P2 presents as a new patient to discuss aving Mirena exchanged. Reports mirena inserted 10/25/20, irregular spotting since insertion. Now with heavier bleeding, passes clots and cramping with bleeding.Hx of fibroids and polyps.   Gynecologic History No LMP recorded. (Menstrual status: IUD).   Contraception/Family planning: IUD and vasectomy Sexually active: yes Last Pap: 04/2022. Results were: normal Last mammogram: 09/2023. Results were: normal  Obstetric History OB History  Gravida Para Term Preterm AB Living  2 2 2   2   SAB IAB Ectopic Multiple Live Births      2    # Outcome Date GA Lbr Len/2nd Weight Sex Type Anes PTL Lv  2 Term 08/25/05   9 lb 6 oz (4.252 kg) F Vag-Spont   LIV  1 Term 05/04/04   7 lb 6 oz (3.345 kg) M Vag-Spont   LIV       11/25/2023    8:08 AM 10/14/2023    8:08 AM 01/06/2023    8:50 AM  Depression screen PHQ 2/9  Decreased Interest 0 1 0  Down, Depressed, Hopeless 0 0 0  PHQ - 2 Score 0 1 0  Altered sleeping 0 0   Tired, decreased energy 0 1   Change in appetite 0 0   Feeling bad or failure about yourself  0 0   Trouble concentrating 0 2   Moving slowly or fidgety/restless 0 0   Suicidal thoughts 0 0   PHQ-9 Score 0 4   Difficult doing work/chores Not difficult at all Somewhat difficult      The following portions of the patient's history were reviewed and updated as appropriate: allergies, current medications, past family history, past medical history, past social history, past surgical history, and problem list.  Review of Systems  All other systems reviewed and are negative.   Past medical history, past surgical history, family history and social history were all reviewed and documented in the EPIC chart.  Exam:  Vitals:   01/14/24 0831  BP: 124/80  Pulse: 88  SpO2: 99%  Weight: 205 lb 6.4 oz (93.2 kg)  Height: 5' 5.5" (1.664 m)   Body mass index is 33.66  kg/m.  Physical Exam Constitutional:      Appearance: Normal appearance. She is normal weight.  Pulmonary:     Effort: Pulmonary effort is normal.  Neurological:     Mental Status: She is alert.  Psychiatric:        Mood and Affect: Mood normal.        Thought Content: Thought content normal.        Judgment: Judgment normal.       Assessment/Plan:   1. Menorrhagia with irregular cycle (Primary) - US Transvaginal Non-OB; Future  Will plan for u/s to evaluate uterus and IUD and manage accordingly  Return for u/s then JC.  Tanda Rockers WHNP-BC 8:46 AM 01/14/2024

## 2024-01-19 ENCOUNTER — Encounter: Payer: Self-pay | Admitting: Nurse Practitioner

## 2024-01-27 DIAGNOSIS — H6123 Impacted cerumen, bilateral: Secondary | ICD-10-CM | POA: Diagnosis not present

## 2024-01-27 DIAGNOSIS — H903 Sensorineural hearing loss, bilateral: Secondary | ICD-10-CM | POA: Diagnosis not present

## 2024-01-27 DIAGNOSIS — H9203 Otalgia, bilateral: Secondary | ICD-10-CM | POA: Diagnosis not present

## 2024-01-27 DIAGNOSIS — M26603 Bilateral temporomandibular joint disorder, unspecified: Secondary | ICD-10-CM | POA: Diagnosis not present

## 2024-02-09 ENCOUNTER — Ambulatory Visit (INDEPENDENT_AMBULATORY_CARE_PROVIDER_SITE_OTHER): Admitting: Radiology

## 2024-02-09 ENCOUNTER — Encounter: Payer: Self-pay | Admitting: Radiology

## 2024-02-09 ENCOUNTER — Ambulatory Visit (INDEPENDENT_AMBULATORY_CARE_PROVIDER_SITE_OTHER)

## 2024-02-09 VITALS — BP 116/80 | HR 95

## 2024-02-09 DIAGNOSIS — N840 Polyp of corpus uteri: Secondary | ICD-10-CM

## 2024-02-09 DIAGNOSIS — N83201 Unspecified ovarian cyst, right side: Secondary | ICD-10-CM

## 2024-02-09 DIAGNOSIS — N921 Excessive and frequent menstruation with irregular cycle: Secondary | ICD-10-CM | POA: Diagnosis not present

## 2024-02-09 DIAGNOSIS — N83202 Unspecified ovarian cyst, left side: Secondary | ICD-10-CM

## 2024-02-09 NOTE — Progress Notes (Signed)
      Subjective: Kathleen Burke is a 48 y.o. female here to discuss u/s results. IUD placed 2021, irregular bleeding and clotting since. Hx of polypectomy 2021.    Review of Systems  All other systems reviewed and are negative.   Past Medical History:  Diagnosis Date   Anemia    Anxiety 2014   Arthritis    right arm, right knee   Chicken pox    Depression    Diabetes mellitus without complication (HCC)    Frequent headaches    GERD (gastroesophageal reflux disease)    Hyperlipemia    Hypertension    Obesity (BMI 30-39.9)    Pre-diabetes    Sleep apnea    CPAP   Trigger finger    MIDDLE FINGER RIGHT HAND SURGERY ON JULY 31ST 2019      Objective:  Today's Vitals   02/09/24 0913  BP: 116/80  Pulse: 95  SpO2: 96%   There is no height or weight on file to calculate BMI.   Narrative & Impression Indication:irregular bleeding, IUD check   Vaginal ultrasound   Retrpverted size and shape 9.26x6.83 x5.86 cm No myometrial masses     Thin symmetrical endometrium 2.86 mm fluid filled with a 18x4mm polypoid area. IUD is located either adjacent to or inside the polyp.     Complex cysts seen on both ovaries 21x64mm right, 24x45mm left   No adnexal masses   Free fluid in the culdesac   Impression: Endometrial polyp, cysts bilateral ovaries       Exam Ended: 02/09/24 09:13 Last Resulted: 02/09/24 10:25     Assessment:/Plan:  1. Endometrial polyp (Primary) Will schedule preop for hysteroscopy  2. Bilateral ovarian cysts     Myna Freimark B, NP 10:25 AM

## 2024-02-17 ENCOUNTER — Other Ambulatory Visit (HOSPITAL_COMMUNITY)
Admission: RE | Admit: 2024-02-17 | Discharge: 2024-02-17 | Disposition: A | Discharge status: Home / Self Care | Source: Ambulatory Visit | Attending: Obstetrics and Gynecology | Admitting: Obstetrics and Gynecology

## 2024-02-17 ENCOUNTER — Ambulatory Visit: Admitting: Obstetrics and Gynecology

## 2024-02-17 ENCOUNTER — Encounter: Payer: Self-pay | Admitting: Obstetrics and Gynecology

## 2024-02-17 VITALS — BP 118/78 | HR 84 | Ht 67.0 in | Wt 207.0 lb

## 2024-02-17 DIAGNOSIS — D219 Benign neoplasm of connective and other soft tissue, unspecified: Secondary | ICD-10-CM | POA: Diagnosis not present

## 2024-02-17 DIAGNOSIS — B3731 Acute candidiasis of vulva and vagina: Secondary | ICD-10-CM

## 2024-02-17 DIAGNOSIS — N921 Excessive and frequent menstruation with irregular cycle: Secondary | ICD-10-CM | POA: Diagnosis not present

## 2024-02-17 DIAGNOSIS — Z1151 Encounter for screening for human papillomavirus (HPV): Secondary | ICD-10-CM | POA: Insufficient documentation

## 2024-02-17 DIAGNOSIS — N898 Other specified noninflammatory disorders of vagina: Secondary | ICD-10-CM | POA: Diagnosis not present

## 2024-02-17 DIAGNOSIS — Z124 Encounter for screening for malignant neoplasm of cervix: Secondary | ICD-10-CM | POA: Insufficient documentation

## 2024-02-17 DIAGNOSIS — Z975 Presence of (intrauterine) contraceptive device: Secondary | ICD-10-CM | POA: Diagnosis not present

## 2024-02-17 DIAGNOSIS — N840 Polyp of corpus uteri: Secondary | ICD-10-CM

## 2024-02-17 LAB — WET PREP FOR TRICH, YEAST, CLUE

## 2024-02-17 NOTE — Assessment & Plan Note (Addendum)
 AUB is likely multifactorial given fibroids and endometrial polyp However currently poorly controlled with Mirena  IUD Discussed treatment options including medical and surgical management. Patient opts for hysterectomy in the setting of failed medical management. EMB completed today without issues. Completed child bearing.  Plan for robotic assisted total laparoscopic hysterectomy and bilateral salpingectomy. Discussed outpatient procedure. Reviewed that  recovery is usually 6 weeks with additional 4 weeks of pelvic rest. Risks including infections, bleeding, and damage to surrounding organs reviewed. Patient in agreement with initial plan. Orders placed for surgery. RTO for preop visit.  Preop checklist: Meds and allergies reviewed. Antibiotics: Ancef, flagyl Caprini score: needs to be assessed DVT ppx: SCDs, lovenox Postop visit: 2, 6 week Additional clearance: medical Tubal and hysterectomy papers signed: N/A

## 2024-02-17 NOTE — Progress Notes (Signed)
 48 y.o. G66P2002 female with AUB, fibroids, endometrial polyp, Mirena  IUD (inserted 10/25/2020), hypertension, OSA, GERD, prediabetes here for surgical consultation for diagnostic hysteroscopy, dilation and curettage, polypectomy. Married, vasectomy. Presents with her husband. Planning cruise to [place]  end of May.  No LMP recorded. (Menstrual status: IUD).  Patient underwent prior hysteroscopy with polypectomy in December 2021 for endometrial polyp.  Benign pathology.  Reports irregular spotting since Mirena  insertion. Now with heavier bleeding, passes clots and cramping with bleeding. Bleeding is irregular. She also notes daily increased vaginal discharge. Wearing a pad for this. Mild right lower back pain.  Completed child bearing.  02/09/2024 TVUS: Retroverted size and shape 9.26x6.83 x5.86 cm.  5 fibroids measured, the largest measuring 2.9 cm.    Thin symmetrical endometrium 2.86 mm fluid filled with a 18x6mm polypoid area. IUD is located either adjacent to or inside the polyp.    Complex cysts seen on both ovaries 21x72mm right, 24x81mm left   Free fluid in the culdesac   Impression: Endometrial polyp, cysts bilateral ovaries, fibroids  Last PAP: No results found for: "DIAGPAP", "HPVHIGH", "ADEQPAP" Sexually active: yes   GYN HISTORY: Prior hysteroscopy, as noted, 2021  OB History  Gravida Para Term Preterm AB Living  2 2 2   2   SAB IAB Ectopic Multiple Live Births      2    # Outcome Date GA Lbr Len/2nd Weight Sex Type Anes PTL Lv  2 Term 08/25/05   9 lb 6 oz (4.252 kg) F Vag-Spont   LIV  1 Term 05/04/04   7 lb 6 oz (3.345 kg) M Vag-Spont   LIV    Past Medical History:  Diagnosis Date   Anemia    Anxiety 2014   Arthritis    right arm, right knee   Chicken pox    Depression    Diabetes mellitus without complication (HCC)    Frequent headaches    GERD (gastroesophageal reflux disease)    Hyperlipemia    Hypertension    Obesity (BMI 30-39.9)    Pre-diabetes     Sleep apnea    CPAP   Trigger finger    MIDDLE FINGER RIGHT HAND SURGERY ON JULY 31ST 2019    Past Surgical History:  Procedure Laterality Date   COLONOSCOPY WITH PROPOFOL  N/A 07/18/2019   Procedure: COLONOSCOPY WITH PROPOFOL ;  Surgeon: Marnee Sink, MD;  Location: [facility];  Service: Endoscopy;  Laterality: N/A;  Latex Sleep apnea   HYSTEROSCOPY WITH D & C N/A 05/20/2018   Procedure: DILATATION AND CURETTAGE /HYSTEROSCOPY;  Surgeon: Darl Edu, MD;  Location: [facility];  Service: Gynecology;  Laterality: N/A;   HYSTEROSCOPY WITH D & C N/A 10/25/2020   Procedure: DILATATION AND CURETTAGE /HYSTEROSCOPY;  Surgeon: Darl Edu, MD;  Location: [facility];  Service: Gynecology;  Laterality: N/A;   INTRAUTERINE DEVICE (IUD) INSERTION N/A 05/20/2018   Procedure: INTRAUTERINE DEVICE (IUD) INSERTION;  Surgeon: Darl Edu, MD;  Location: [facility];  Service: Gynecology;  Laterality: N/A;   INTRAUTERINE DEVICE (IUD) INSERTION  10/25/2020   Procedure: INTRAUTERINE DEVICE (IUD) INSERTION;  Surgeon: Darl Edu, MD;  Location: [facility];  Service: Gynecology;;   WISDOM TOOTH EXTRACTION      Current Outpatient Medications on File Prior to Visit  Medication Sig Dispense Refill   Accu-Chek FastClix Lancets MISC USE AS DIRECTED ONCE A DAY 102 each 12   ACCU-CHEK GUIDE test strip USE AS DIRECTED TO CHECK SUGAR ONCE A DAY 100 strip 3   ALPRAZolam  (  XANAX ) 0.5 MG tablet Take 1 tablet (0.5 mg total) by mouth daily as needed for anxiety. 30 tablet 0   b complex vitamins capsule Take 1 capsule by mouth daily.     Cholecalciferol (VITAMIN D3) 125 MCG (5000 UT) CAPS Take 5,000 Units by mouth daily.     CVS TRIPLE MAGNESIUM COMPLEX PO      Cyanocobalamin (B-12) 1000 MCG TABS Take 1,000 mcg by mouth daily.     docusate sodium (COLACE) 100 MG capsule Take 100 mg by mouth daily.      ELDERBERRY PO Take 1 each by mouth 2 (two) times daily. Gummies     escitalopram  (LEXAPRO ) 10 MG  tablet Take 1 tablet (10 mg total) by mouth daily. 90 tablet 3   ferrous sulfate 325 (65 FE) MG tablet Take 325 mg by mouth daily with breakfast.      levonorgestrel  (MIRENA , 52 MG,) 20 MCG/DAY IUD 1 each by Intrauterine route once.     metoprolol  succinate (TOPROL  XL) 50 MG 24 hr tablet Take 1 tablet (50 mg total) by mouth daily. Take with or immediately following a meal. Stop HCTZ and Toprol  25 90 tablet 3   pravastatin  (PRAVACHOL ) 20 MG tablet Take 1 tablet (20 mg total) by mouth daily after 6 pm. 90 tablet 3   Semaglutide , 2 MG/DOSE, 8 MG/3ML SOPN Inject 2 mg into the skin once a week. 9 mL 3   TURMERIC PO Take 500 mg by mouth daily. With ginger      No current facility-administered medications on file prior to visit.    Allergies  Allergen Reactions   Latex Itching    GLOVES (extended exposure)      PE Today's Vitals   02/17/24 1418  BP: 118/78  Pulse: 84  SpO2: 95%  Weight: 207 lb (93.9 kg)  Height: 5\' 7"  (1.702 m)   Body mass index is 32.42 kg/m.  Physical Exam Vitals reviewed. Exam conducted with a chaperone present.  Constitutional:      General: She is not in acute distress.    Appearance: Normal appearance.  HENT:     Head: Normocephalic and atraumatic.     Nose: Nose normal.  Eyes:     Extraocular Movements: Extraocular movements intact.     Conjunctiva/sclera: Conjunctivae normal.  Cardiovascular:     Rate and Rhythm: Normal rate and regular rhythm.     Heart sounds: No murmur heard.    No friction rub. No gallop.  Pulmonary:     Effort: Pulmonary effort is normal. No respiratory distress.     Breath sounds: Normal breath sounds. No stridor. No wheezing, rhonchi or rales.  Genitourinary:    General: Normal vulva.     Exam position: Lithotomy position.     Vagina: Normal. No vaginal discharge.     Cervix: Normal. No cervical motion tenderness, discharge or lesion.     Uterus: Normal. Not enlarged and not tender.      Adnexa: Right adnexa normal and  left adnexa normal.     Comments: IUD strings present. Musculoskeletal:        General: Normal range of motion.     Cervical back: Normal range of motion.  Neurological:     General: No focal deficit present.     Mental Status: She is alert.  Psychiatric:        Mood and Affect: Mood normal.        Behavior: Behavior normal.     Procedure Consent was  signed. Timeout was performed. Declined paracervical block. Speculum inserted into the vagina, cervix visualized and was prepped with Betadine .  A single-toothed tenaculum was placed on the anterior lip of the cervix to stabilize it.  The 3 mm pipelle was introduced into the endometrial cavity without difficulty to a depth of 10cm, suction initiated and a moderate amount of tissue was obtained over 2 passes and sent to pathology.  The instruments were removed from the patient's vagina.  Minimal bleeding from the cervix was noted.  The patient tolerated the procedure well.     Assessment and Plan:        Menorrhagia with irregular cycle Assessment & Plan: AUB is likely multifactorial given fibroids and endometrial polyp However currently poorly controlled with Mirena  IUD Discussed treatment options including medical and surgical management. Patient opts for hysterectomy in the setting of failed medical management. EMB completed today without issues. Completed child bearing.  Plan for robotic assisted total laparoscopic hysterectomy and bilateral salpingectomy. Discussed outpatient procedure. Reviewed that  recovery is usually 6 weeks with additional 4 weeks of pelvic rest. Risks including infections, bleeding, and damage to surrounding organs reviewed. Patient in agreement with initial plan. Orders placed for surgery. RTO for preop visit.  Preop checklist: Meds and allergies reviewed. Antibiotics: Ancef, flagyl Caprini score: needs to be assessed DVT ppx: SCDs, lovenox Postop visit: 2, 6 week Additional clearance:  medical Tubal and hysterectomy papers signed: N/A   Orders: -     Endometrial biopsy; Future -     Ambulatory Referral For Surgery Scheduling -     Surgical pathology  Vaginal discharge -     WET PREP FOR TRICH, YEAST, CLUE  Endometrial polyp -     Ambulatory Referral For Surgery Scheduling  Uses hormone releasing intrauterine device (IUD) for contraception  Cervical cancer screening -     Cytology - PAP  Fibroids -     Endometrial biopsy; Future -     Ambulatory Referral For Surgery Scheduling -     Surgical pathology    Romaine Closs, MD

## 2024-02-18 ENCOUNTER — Encounter: Payer: Self-pay | Admitting: Obstetrics and Gynecology

## 2024-02-19 ENCOUNTER — Other Ambulatory Visit: Payer: Self-pay

## 2024-02-19 LAB — SURGICAL PATHOLOGY

## 2024-02-22 ENCOUNTER — Other Ambulatory Visit: Payer: Self-pay

## 2024-02-22 LAB — CYTOLOGY - PAP
Comment: NEGATIVE
Diagnosis: NEGATIVE
High risk HPV: NEGATIVE

## 2024-02-22 MED ORDER — FLUCONAZOLE 150 MG PO TABS
150.0000 mg | ORAL_TABLET | Freq: Once | ORAL | 2 refills | Status: AC
  Filled 2024-02-22: qty 1, 1d supply, fill #0

## 2024-02-22 NOTE — Addendum Note (Signed)
 Addended by: Cleone Dad V on: 02/22/2024 11:31 AM   Modules accepted: Orders

## 2024-02-23 ENCOUNTER — Encounter: Payer: Self-pay | Admitting: Nurse Practitioner

## 2024-02-23 ENCOUNTER — Other Ambulatory Visit: Payer: Self-pay | Admitting: Nurse Practitioner

## 2024-02-23 ENCOUNTER — Other Ambulatory Visit: Payer: Self-pay

## 2024-02-23 ENCOUNTER — Telehealth: Payer: Self-pay

## 2024-02-23 DIAGNOSIS — F32A Depression, unspecified: Secondary | ICD-10-CM

## 2024-02-23 MED ORDER — ESCITALOPRAM OXALATE 20 MG PO TABS
20.0000 mg | ORAL_TABLET | Freq: Every day | ORAL | 3 refills | Status: AC
  Filled 2024-02-23: qty 90, 90d supply, fill #0
  Filled 2024-05-20: qty 90, 90d supply, fill #1
  Filled 2024-08-27: qty 90, 90d supply, fill #2
  Filled 2024-11-12: qty 90, 90d supply, fill #3

## 2024-02-23 NOTE — Telephone Encounter (Signed)
 Surgical clearance was received for pt. Paper has been placed in provider to be signed folder an a mychart message hs been sent to pt informing to schedule a surgical clearance appt as last OV was 11-25-23.

## 2024-02-25 DIAGNOSIS — E119 Type 2 diabetes mellitus without complications: Secondary | ICD-10-CM | POA: Diagnosis not present

## 2024-02-25 DIAGNOSIS — H04123 Dry eye syndrome of bilateral lacrimal glands: Secondary | ICD-10-CM | POA: Diagnosis not present

## 2024-02-25 DIAGNOSIS — H5213 Myopia, bilateral: Secondary | ICD-10-CM | POA: Diagnosis not present

## 2024-02-25 LAB — HM DIABETES EYE EXAM

## 2024-03-10 ENCOUNTER — Other Ambulatory Visit: Payer: Self-pay

## 2024-03-10 MED ORDER — CYCLOBENZAPRINE HCL 10 MG PO TABS
10.0000 mg | ORAL_TABLET | Freq: Every day | ORAL | 1 refills | Status: DC
  Filled 2024-03-10: qty 30, 30d supply, fill #0
  Filled 2024-04-05: qty 30, 30d supply, fill #1

## 2024-04-07 ENCOUNTER — Other Ambulatory Visit: Payer: Self-pay

## 2024-04-08 ENCOUNTER — Other Ambulatory Visit: Payer: Self-pay

## 2024-04-13 ENCOUNTER — Other Ambulatory Visit: Payer: Self-pay

## 2024-04-13 ENCOUNTER — Ambulatory Visit: Payer: Commercial Managed Care - PPO | Admitting: Nurse Practitioner

## 2024-04-13 VITALS — BP 122/82 | HR 90 | Temp 98.5°F | Ht 67.0 in | Wt 217.4 lb

## 2024-04-13 DIAGNOSIS — I10 Essential (primary) hypertension: Secondary | ICD-10-CM

## 2024-04-13 DIAGNOSIS — R7303 Prediabetes: Secondary | ICD-10-CM

## 2024-04-13 DIAGNOSIS — R350 Frequency of micturition: Secondary | ICD-10-CM | POA: Diagnosis not present

## 2024-04-13 DIAGNOSIS — R0982 Postnasal drip: Secondary | ICD-10-CM | POA: Diagnosis not present

## 2024-04-13 DIAGNOSIS — E559 Vitamin D deficiency, unspecified: Secondary | ICD-10-CM

## 2024-04-13 DIAGNOSIS — F419 Anxiety disorder, unspecified: Secondary | ICD-10-CM

## 2024-04-13 DIAGNOSIS — E785 Hyperlipidemia, unspecified: Secondary | ICD-10-CM

## 2024-04-13 DIAGNOSIS — Z0001 Encounter for general adult medical examination with abnormal findings: Secondary | ICD-10-CM | POA: Diagnosis not present

## 2024-04-13 DIAGNOSIS — Z1231 Encounter for screening mammogram for malignant neoplasm of breast: Secondary | ICD-10-CM

## 2024-04-13 DIAGNOSIS — Z01818 Encounter for other preprocedural examination: Secondary | ICD-10-CM | POA: Diagnosis not present

## 2024-04-13 DIAGNOSIS — Z1329 Encounter for screening for other suspected endocrine disorder: Secondary | ICD-10-CM | POA: Diagnosis not present

## 2024-04-13 DIAGNOSIS — F32A Depression, unspecified: Secondary | ICD-10-CM

## 2024-04-13 LAB — CBC WITH DIFFERENTIAL/PLATELET
Basophils Absolute: 0 10*3/uL (ref 0.0–0.1)
Basophils Relative: 0.4 % (ref 0.0–3.0)
Eosinophils Absolute: 0.1 10*3/uL (ref 0.0–0.7)
Eosinophils Relative: 1.5 % (ref 0.0–5.0)
HCT: 38.5 % (ref 36.0–46.0)
Hemoglobin: 12.7 g/dL (ref 12.0–15.0)
Lymphocytes Relative: 27.9 % (ref 12.0–46.0)
Lymphs Abs: 1.7 10*3/uL (ref 0.7–4.0)
MCHC: 33 g/dL (ref 30.0–36.0)
MCV: 89.9 fl (ref 78.0–100.0)
Monocytes Absolute: 0.5 10*3/uL (ref 0.1–1.0)
Monocytes Relative: 7.7 % (ref 3.0–12.0)
Neutro Abs: 3.8 10*3/uL (ref 1.4–7.7)
Neutrophils Relative %: 62.5 % (ref 43.0–77.0)
Platelets: 328 10*3/uL (ref 150.0–400.0)
RBC: 4.29 Mil/uL (ref 3.87–5.11)
RDW: 13.9 % (ref 11.5–15.5)
WBC: 6.1 10*3/uL (ref 4.0–10.5)

## 2024-04-13 LAB — URINALYSIS, ROUTINE W REFLEX MICROSCOPIC
Bilirubin Urine: NEGATIVE
Hgb urine dipstick: NEGATIVE
Ketones, ur: NEGATIVE
Leukocytes,Ua: NEGATIVE
Nitrite: NEGATIVE
RBC / HPF: NONE SEEN (ref 0–?)
Specific Gravity, Urine: 1.01 (ref 1.000–1.030)
Total Protein, Urine: NEGATIVE
Urine Glucose: NEGATIVE
Urobilinogen, UA: 0.2 (ref 0.0–1.0)
WBC, UA: NONE SEEN (ref 0–?)
pH: 6 (ref 5.0–8.0)

## 2024-04-13 LAB — VITAMIN D 25 HYDROXY (VIT D DEFICIENCY, FRACTURES): VITD: 36.95 ng/mL (ref 30.00–100.00)

## 2024-04-13 LAB — LIPID PANEL
Cholesterol: 194 mg/dL (ref 0–200)
HDL: 71 mg/dL (ref >39.00)
LDL Cholesterol: 111 mg/dL — ABNORMAL HIGH (ref 0–99)
NonHDL: 123.21
Total CHOL/HDL Ratio: 3
Triglycerides: 59 mg/dL (ref 0.0–149.0)
VLDL: 11.8 mg/dL (ref 0.0–40.0)

## 2024-04-13 LAB — MICROALBUMIN / CREATININE URINE RATIO
Creatinine,U: 50.2 mg/dL
Microalb Creat Ratio: UNDETERMINED mg/g (ref 0.0–30.0)
Microalb, Ur: <0.7 mg/dL

## 2024-04-13 LAB — COMPREHENSIVE METABOLIC PANEL WITH GFR
ALT: 16 U/L (ref 0–35)
AST: 21 U/L (ref 0–37)
Albumin: 4.6 g/dL (ref 3.5–5.2)
Alkaline Phosphatase: 74 U/L (ref 39–117)
BUN: 13 mg/dL (ref 6–23)
CO2: 30 meq/L (ref 19–32)
Calcium: 9.5 mg/dL (ref 8.4–10.5)
Chloride: 101 meq/L (ref 96–112)
Creatinine, Ser: 0.77 mg/dL (ref 0.40–1.20)
GFR: 90.19 mL/min (ref >60.00)
Glucose, Bld: 86 mg/dL (ref 70–99)
Potassium: 4.1 meq/L (ref 3.5–5.1)
Sodium: 136 meq/L (ref 135–145)
Total Bilirubin: 0.3 mg/dL (ref 0.2–1.2)
Total Protein: 7.9 g/dL (ref 6.0–8.3)

## 2024-04-13 LAB — TSH: TSH: 1.39 u[IU]/mL (ref 0.35–5.50)

## 2024-04-13 LAB — HEMOGLOBIN A1C: Hgb A1c MFr Bld: 5.6 % (ref 4.6–6.5)

## 2024-04-13 MED ORDER — OXYBUTYNIN CHLORIDE ER 5 MG PO TB24
5.0000 mg | ORAL_TABLET | Freq: Every day | ORAL | 3 refills | Status: AC
  Filled 2024-04-13: qty 90, 90d supply, fill #0
  Filled 2024-07-07: qty 90, 90d supply, fill #1
  Filled 2024-10-05: qty 90, 90d supply, fill #2

## 2024-04-13 NOTE — Progress Notes (Unsigned)
 Bluford Burkitt, NP-C Phone: [phone]  Kathleen Burke is a 48 y.o. female who presents today for follow up.   ***  Social History   Tobacco Use  Smoking Status Never   Passive exposure: Never  Smokeless Tobacco Never    Current Outpatient Medications on File Prior to Visit  Medication Sig Dispense Refill   ALPRAZolam  (XANAX ) 0.5 MG tablet Take 1 tablet (0.5 mg total) by mouth daily as needed for anxiety. 30 tablet 0   b complex vitamins capsule Take 1 capsule by mouth daily.     Cholecalciferol (VITAMIN D3) 125 MCG (5000 UT) CAPS Take 5,000 Units by mouth daily.     CVS TRIPLE MAGNESIUM COMPLEX PO      Cyanocobalamin (B-12) 1000 MCG TABS Take 1,000 mcg by mouth daily.     cyclobenzaprine  (FLEXERIL ) 10 MG tablet Take 1 tablet (10 mg total) by mouth at bedtime. 30 tablet 1   docusate sodium (COLACE) 100 MG capsule Take 100 mg by mouth daily.      ELDERBERRY PO Take 1 each by mouth 2 (two) times daily. Gummies     escitalopram  (LEXAPRO ) 20 MG tablet Take 1 tablet (20 mg total) by mouth daily. 90 tablet 3   ferrous sulfate 325 (65 FE) MG tablet Take 325 mg by mouth daily with breakfast.      levonorgestrel  (MIRENA , 52 MG,) 20 MCG/DAY IUD 1 each by Intrauterine route once.     metoprolol  succinate (TOPROL  XL) 50 MG 24 hr tablet Take 1 tablet (50 mg total) by mouth daily. Take with or immediately following a meal. Stop HCTZ and Toprol  25 90 tablet 3   pravastatin  (PRAVACHOL ) 20 MG tablet Take 1 tablet (20 mg total) by mouth daily after 6 pm. 90 tablet 3   Semaglutide , 2 MG/DOSE, 8 MG/3ML SOPN Inject 2 mg into the skin once a week. 9 mL 3   TURMERIC PO Take 500 mg by mouth daily. With ginger      No current facility-administered medications on file prior to visit.     ROS see history of present illness  Objective  Physical Exam Vitals:   04/13/24 0807  BP: 122/82  Pulse: 90  Temp: 98.5 F (36.9 C)  SpO2: 95%    BP Readings from Last 3 Encounters:  04/13/24 122/82   02/17/24 118/78  02/09/24 116/80   Wt Readings from Last 3 Encounters:  04/13/24 217 lb 6.4 oz (98.6 kg)  02/17/24 207 lb (93.9 kg)  01/14/24 205 lb 6.4 oz (93.2 kg)    Physical Exam Constitutional:      General: She is not in acute distress.    Appearance: Normal appearance.  HENT:     Head: Normocephalic.     Right Ear: Tympanic membrane normal.     Left Ear: Tympanic membrane normal.     Nose: Nose normal.     Mouth/Throat:     Mouth: Mucous membranes are moist.     Pharynx: Oropharynx is clear.   Eyes:     Conjunctiva/sclera: Conjunctivae normal.     Pupils: Pupils are equal, round, and reactive to light.   Neck:     Thyroid : No thyromegaly.   Cardiovascular:     Rate and Rhythm: Normal rate and regular rhythm.     Heart sounds: Normal heart sounds.  Pulmonary:     Effort: Pulmonary effort is normal.     Breath sounds: Normal breath sounds.  Abdominal:     General: Abdomen is  flat. Bowel sounds are normal.     Palpations: Abdomen is soft. There is no mass.     Tenderness: There is no abdominal tenderness.   Musculoskeletal:        General: Normal range of motion.  Lymphadenopathy:     Cervical: No cervical adenopathy.   Skin:    General: Skin is warm and dry.     Findings: No rash.   Neurological:     General: No focal deficit present.     Mental Status: She is alert.   Psychiatric:        Mood and Affect: Mood normal.        Behavior: Behavior normal.      Assessment/Plan: Please see individual problem list.  Encounter for routine adult health examination with abnormal findings  Urinary frequency -     Urinalysis, Routine w reflex microscopic  Primary hypertension -     CBC with Differential/Platelet -     Comprehensive metabolic panel with GFR  Anxiety and depression  Hyperlipidemia, unspecified hyperlipidemia type -     Lipid panel  Prediabetes -     Microalbumin / creatinine urine ratio -     Hemoglobin A1c  Vitamin D   deficiency -     VITAMIN D  25 Hydroxy (Vit-D Deficiency, Fractures)  Thyroid  disorder screen -     TSH  Screening mammogram for breast cancer -     3D Screening Mammogram, Left and Right; Future     Health Maintenance: ***  Return in about 6 months (around 10/13/2024) for Follow up.   Bluford Burkitt, NP-C [facility]

## 2024-04-18 ENCOUNTER — Ambulatory Visit: Payer: Self-pay | Admitting: Nurse Practitioner

## 2024-04-18 ENCOUNTER — Other Ambulatory Visit: Payer: Self-pay | Admitting: Nurse Practitioner

## 2024-04-18 ENCOUNTER — Other Ambulatory Visit: Payer: Self-pay

## 2024-04-18 DIAGNOSIS — E785 Hyperlipidemia, unspecified: Secondary | ICD-10-CM

## 2024-04-18 MED ORDER — PRAVASTATIN SODIUM 40 MG PO TABS
40.0000 mg | ORAL_TABLET | Freq: Every day | ORAL | 3 refills | Status: AC
  Filled 2024-04-18 – 2024-05-20 (×2): qty 90, 90d supply, fill #0
  Filled 2024-07-14 – 2024-08-19 (×2): qty 90, 90d supply, fill #1
  Filled 2024-11-17: qty 90, 90d supply, fill #2

## 2024-04-20 ENCOUNTER — Encounter: Payer: Self-pay | Admitting: Nurse Practitioner

## 2024-04-20 DIAGNOSIS — Z01818 Encounter for other preprocedural examination: Secondary | ICD-10-CM | POA: Insufficient documentation

## 2024-04-20 DIAGNOSIS — R0982 Postnasal drip: Secondary | ICD-10-CM | POA: Insufficient documentation

## 2024-04-20 NOTE — Assessment & Plan Note (Signed)
 Preoperative clearance is required for a total hysterectomy due to heavy bleeding and fibroids. Medically cleared to proceed with surgery. Clearance faxed to Ob-Gyn.

## 2024-04-20 NOTE — Assessment & Plan Note (Signed)
 Hyperlipidemia is managed with Pravastatin  20mg  daily. We will continue the current regimen. Check lipid panel today.

## 2024-04-20 NOTE — Telephone Encounter (Signed)
 Letter for clearance signed and faxed over

## 2024-04-20 NOTE — Assessment & Plan Note (Signed)
 Physical exam complete. We will check routine lab work as outlined and contact patient with results. Pap smear is up to date. Colonoscopy is up to date. Mammogram due in December. Flu, tetanus and COVID vaccines are up to date. She is interested in the Shingles vaccine, education provided. Encouraged to get when ready at local pharmacy. Continue routine dental and eye exams. Encourage healthy diet and regular exercise. Return to care in 6 months, sooner as needed.

## 2024-04-20 NOTE — Assessment & Plan Note (Signed)
 Morning cough is likely due to allergies or post-nasal drip. Recommend a trial of an antihistamine at bedtime or first thing in the morning for 7-10 days. Return precautions given to patient.

## 2024-04-20 NOTE — Assessment & Plan Note (Signed)
 Her hypertension is well controlled on Toprol XL 50mg  daily. We will continue the current regimen. Follow up with Cardiology as scheduled.

## 2024-04-20 NOTE — Assessment & Plan Note (Signed)
 Her prediabetes is well controlled on Ozempic  2mg  weekly, with no symptoms of hyperglycemia or hypoglycemia. We will continue the current regimen. Check A1c and urine ACR today.

## 2024-04-20 NOTE — Assessment & Plan Note (Signed)
 She experiences frequent urination and incomplete bladder emptying, with concerns about post-hysterectomy worsening. Check UA. Start oxybutynin  5 mg daily. Counseled on common side effects. Consider referral to UroGyn if symptoms worsening.

## 2024-04-20 NOTE — Assessment & Plan Note (Signed)
 Mood is stable on Lexapro  20 mg daily. Continue. Encouraged to contact if worsening symptoms, unusual behavior changes or suicidal thoughts occur.

## 2024-04-26 ENCOUNTER — Ambulatory Visit: Payer: Commercial Managed Care - PPO | Admitting: Nurse Practitioner

## 2024-05-02 ENCOUNTER — Ambulatory Visit
Admission: RE | Admit: 2024-05-02 | Discharge: 2024-05-02 | Disposition: A | Discharge status: Home / Self Care | Source: Ambulatory Visit | Attending: Emergency Medicine | Admitting: Emergency Medicine

## 2024-05-02 ENCOUNTER — Other Ambulatory Visit: Payer: Self-pay

## 2024-05-02 VITALS — BP 130/88 | HR 100 | Temp 97.7°F | Resp 16

## 2024-05-02 DIAGNOSIS — Z8619 Personal history of other infectious and parasitic diseases: Secondary | ICD-10-CM

## 2024-05-02 DIAGNOSIS — J0100 Acute maxillary sinusitis, unspecified: Secondary | ICD-10-CM

## 2024-05-02 MED ORDER — AMOXICILLIN-POT CLAVULANATE 875-125 MG PO TABS
1.0000 | ORAL_TABLET | Freq: Two times a day (BID) | ORAL | 0 refills | Status: DC
  Filled 2024-05-02: qty 14, 7d supply, fill #0

## 2024-05-02 MED ORDER — FLUCONAZOLE 150 MG PO TABS
150.0000 mg | ORAL_TABLET | Freq: Once | ORAL | 0 refills | Status: AC
  Filled 2024-05-02: qty 1, 1d supply, fill #0

## 2024-05-02 NOTE — ED Provider Notes (Signed)
 CAY RALPH PELT    CSN: [id number] Arrival date & time: 05/02/24  1724      History   Chief Complaint Chief Complaint  Patient presents with   Headache    Cough, sore throat, runny nose, congestion - Entered by patient    HPI Kathleen Burke is a 48 y.o. female.  Patient presents with 1 week history of ear pain, sore throat, congestion, cough, headache.  Treatment attempted with Tylenol  and Mucinex.  No fever or shortness of breath.  The history is provided by the patient and medical records.    Past Medical History:  Diagnosis Date   Anemia    Anxiety 2014   Arthritis    right arm, right knee   Chicken pox    Depression    Diabetes mellitus without complication (HCC)    Frequent headaches    GERD (gastroesophageal reflux disease)    Hyperlipemia    Hypertension    Obesity (BMI 30-39.9)    Pre-diabetes    Sleep apnea    CPAP   Trigger finger    MIDDLE FINGER RIGHT HAND SURGERY ON JULY 31ST 2019    Patient Active Problem List   Diagnosis Date Noted   Pre-op evaluation 04/20/2024   PND (post-nasal drip) 04/20/2024   Encounter for routine adult health examination with abnormal findings 04/13/2024   Urinary frequency 04/13/2024   Fibroids 02/17/2024   Uses hormone releasing intrauterine device (IUD) for contraception 02/17/2024   Menorrhagia with irregular cycle 02/17/2024   Preventative health care 04/14/2023   Palpitations 08/26/2022   Enlarged uterus 05/26/2022   Hyperlipidemia 04/11/2022   Gastroesophageal reflux disease without esophagitis 04/05/2021   Endometrial polyp 03/26/2021   Blood transfusion declined because patient is Jehovah's Witness 10/18/2020   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   Prediabetes 03/22/2020   Vitamin D  deficiency 03/22/2020   CTS (carpal tunnel syndrome) 12/30/2019   OSA (obstructive sleep apnea) 06/16/2019   Nonintractable headache 06/16/2019   Edema 06/16/2019   Eczema 12/01/2018   HTN (hypertension) 06/22/2018    Trigger finger, right 06/22/2018   Microcytic hypochromic anemia 03/23/2018   Anxiety and depression 01/14/2016    Past Surgical History:  Procedure Laterality Date   COLONOSCOPY WITH PROPOFOL  N/A 07/18/2019   Procedure: COLONOSCOPY WITH PROPOFOL ;  Surgeon: Jinny Carmine, MD;  Location: [facility];  Service: Endoscopy;  Laterality: N/A;  Latex Sleep apnea   HYSTEROSCOPY WITH D & C N/A 05/20/2018   Procedure: DILATATION AND CURETTAGE /HYSTEROSCOPY;  Surgeon: Lake Read, MD;  Location: [facility];  Service: Gynecology;  Laterality: N/A;   HYSTEROSCOPY WITH D & C N/A 10/25/2020   Procedure: DILATATION AND CURETTAGE /HYSTEROSCOPY;  Surgeon: Lake Read, MD;  Location: [facility];  Service: Gynecology;  Laterality: N/A;   INTRAUTERINE DEVICE (IUD) INSERTION N/A 05/20/2018   Procedure: INTRAUTERINE DEVICE (IUD) INSERTION;  Surgeon: Lake Read, MD;  Location: [facility];  Service: Gynecology;  Laterality: N/A;   INTRAUTERINE DEVICE (IUD) INSERTION  10/25/2020   Procedure: INTRAUTERINE DEVICE (IUD) INSERTION;  Surgeon: Lake Read, MD;  Location: [facility];  Service: Gynecology;;   WISDOM TOOTH EXTRACTION      OB History     Gravida  2   Para  2   Term  2   Preterm      AB      Living  2      SAB      IAB      Ectopic  Multiple      Live Births  2            Home Medications    Prior to Admission medications   Medication Sig Start Date End Date Taking? Authorizing Provider  amoxicillin -clavulanate (AUGMENTIN ) 875-125 MG tablet Take 1 tablet by mouth every 12 (twelve) hours. 05/02/24  Yes Corlis Burnard DEL, NP  fluconazole  (DIFLUCAN ) 150 MG tablet Take 1 tablet (150 mg total) by mouth once for 1 dose. 05/02/24 05/03/24 Yes Corlis Burnard DEL, NP  ALPRAZolam  (XANAX ) 0.5 MG tablet Take 1 tablet (0.5 mg total) by mouth daily as needed for anxiety. 11/25/23   Gretel App, NP  b complex vitamins capsule Take 1 capsule by mouth daily.    [provider]  Cholecalciferol (VITAMIN D3) 125 MCG (5000 UT) CAPS Take 5,000 Units by mouth daily.    [provider]  [facility] TRIPLE MAGNESIUM COMPLEX PO  11/09/23   [provider]  Cyanocobalamin (B-12) 1000 MCG TABS Take 1,000 mcg by mouth daily. 08/16/19   [provider]  cyclobenzaprine  (FLEXERIL ) 10 MG tablet Take 1 tablet (10 mg total) by mouth at bedtime. 03/10/24     docusate sodium (COLACE) 100 MG capsule Take 100 mg by mouth daily.     [provider]  ELDERBERRY PO Take 1 each by mouth 2 (two) times daily. Gummies    [provider]  escitalopram  (LEXAPRO ) 20 MG tablet Take 1 tablet (20 mg total) by mouth daily. 02/23/24   Gretel App, NP  ferrous sulfate 325 (65 FE) MG tablet Take 325 mg by mouth daily with breakfast.     [provider]  levonorgestrel  (MIRENA , 52 MG,) 20 MCG/DAY IUD 1 each by Intrauterine route once. 10/25/20   [provider]  metoprolol  succinate (TOPROL  XL) 50 MG 24 hr tablet Take 1 tablet (50 mg total) by mouth daily. Take with or immediately following a meal. Stop HCTZ and Toprol  25 07/13/23 07/12/24  Gretel App, NP  oxybutynin  (DITROPAN  XL) 5 MG 24 hr tablet Take 1 tablet (5 mg total) by mouth at bedtime. 04/13/24   Gretel App, NP  pravastatin  (PRAVACHOL ) 40 MG tablet Take 1 tablet (40 mg total) by mouth daily. 04/18/24   Gretel App, NP  Semaglutide , 2 MG/DOSE, 8 MG/3ML SOPN Inject 2 mg into the skin once a week. 07/13/23   Gretel App, NP  TURMERIC PO Take 500 mg by mouth daily. With ginger     [provider]    Family History Family History  Problem Relation Age of Onset   Arthritis Mother    Hyperlipidemia Mother    Hypertension Mother    Diabetes Mellitus I Father        DM I   Diabetes Mellitus II Sister    Diabetes Mellitus II Sister    Hyperlipidemia Sister    Hypertension Sister    Breast cancer Maternal Grandmother 59   Arthritis Paternal Grandmother    Diabetes Mellitus II  Paternal Grandmother    Arthritis Paternal Grandfather    Hyperlipidemia Paternal Grandfather    Stroke Paternal Grandfather    Hypertension Paternal Grandfather    Seizures Son     Social History Social History   Tobacco Use   Smoking status: Never    Passive exposure: Never   Smokeless tobacco: Never  Vaping Use   Vaping status: Never Used  Substance Use Topics   Alcohol use: Yes    Alcohol/week: 2.0 standard drinks of  alcohol    Types: 2 Shots of liquor per week    Comment: OCC-WEEKENDS   Drug use: No     Allergies   Latex   Review of Systems Review of Systems  Constitutional:  Negative for chills and fever.  HENT:  Positive for congestion, ear pain and sore throat.   Respiratory:  Positive for cough. Negative for shortness of breath.   Neurological:  Positive for headaches.     Physical Exam Triage Vital Signs ED Triage Vitals  Encounter Vitals Group     BP 05/02/24 1733 130/88     Girls Systolic BP Percentile --      Girls Diastolic BP Percentile --      Boys Systolic BP Percentile --      Boys Diastolic BP Percentile --      Pulse Rate 05/02/24 1733 100     Resp 05/02/24 1733 16     Temp 05/02/24 1733 97.7 F (36.5 C)     Temp src --      SpO2 05/02/24 1733 98 %     Weight --      Height --      Head Circumference --      Peak Flow --      Pain Score 05/02/24 1731 5     Pain Loc --      Pain Education --      Exclude from Growth Chart --    No data found.  Updated Vital Signs BP 130/88   Pulse 100   Temp 97.7 F (36.5 C)   Resp 16   SpO2 98%   Visual Acuity Right Eye Distance:   Left Eye Distance:   Bilateral Distance:    Right Eye Near:   Left Eye Near:    Bilateral Near:     Physical Exam Constitutional:      General: She is not in acute distress. HENT:     Right Ear: Tympanic membrane normal.     Left Ear: Tympanic membrane normal.     Nose: Congestion present.     Mouth/Throat:     Mouth: Mucous membranes are moist.      Pharynx: Oropharynx is clear.  Cardiovascular:     Rate and Rhythm: Normal rate and regular rhythm.     Heart sounds: Normal heart sounds.  Pulmonary:     Effort: Pulmonary effort is normal. No respiratory distress.     Breath sounds: Normal breath sounds.  Neurological:     Mental Status: She is alert.      UC Treatments / Results  Labs (all labs ordered are listed, but only abnormal results are displayed) Labs Reviewed - No data to display  EKG   Radiology No results found.  Procedures Procedures (including critical care time)  Medications Ordered in UC Medications - No data to display  Initial Impression / Assessment and Plan / UC Course  I have reviewed the triage vital signs and the nursing notes.  Pertinent labs & imaging results that were available during my care of the patient were reviewed by me and considered in my medical decision making (see chart for details).    Acute sinusitis, history of vaginal candidiasis with antibiotic use.  Patient has been symptomatic for a week and is not improving with OTC treatment.  Treating today with Augmentin .  1 dose of Diflucan  also prescribed as patient reports she gets vaginal yeast infection when she takes an antibiotic.  Education provided on sinus  infection.  Instructed patient to follow-up with her PCP if she is not improving.  She agrees to plan of care.  Final Clinical Impressions(s) / UC Diagnoses   Final diagnoses:  Acute non-recurrent maxillary sinusitis  History of candidiasis of vagina     Discharge Instructions      Take the Augmentin  and Diflucan  as directed.  Follow-up with your primary care provider if your symptoms are not improving.      ED Prescriptions     Medication Sig Dispense Auth. Provider   amoxicillin -clavulanate (AUGMENTIN ) 875-125 MG tablet Take 1 tablet by mouth every 12 (twelve) hours. 14 tablet Corlis Burnard DEL, NP   fluconazole  (DIFLUCAN ) 150 MG tablet Take 1 tablet (150 mg  total) by mouth once for 1 dose. 1 tablet Corlis Burnard DEL, NP      PDMP not reviewed this encounter.   Corlis Burnard DEL, NP 05/02/24 [phone]

## 2024-05-02 NOTE — ED Triage Notes (Addendum)
 Patient to Urgent Care with complaints of headaches and sinus pain/ cough/ sore throat/ bilateral ear fullness. Denies any known fevers.  Reports symptoms x1 week.  Taking tylenol / mucinex.

## 2024-05-02 NOTE — Discharge Instructions (Addendum)
Take the Augmentin and Diflucan as directed.    Follow-up with your primary care provider if your symptoms are not improving.

## 2024-05-03 ENCOUNTER — Other Ambulatory Visit: Payer: Self-pay

## 2024-05-13 ENCOUNTER — Other Ambulatory Visit: Payer: Self-pay

## 2024-05-13 MED ORDER — SHINGRIX 50 MCG/0.5ML IM SUSR
0.5000 mL | Freq: Once | INTRAMUSCULAR | 1 refills | Status: AC
  Filled 2024-05-13: qty 0.5, 1d supply, fill #0

## 2024-05-17 ENCOUNTER — Ambulatory Visit: Payer: Commercial Managed Care - PPO | Admitting: Nurse Practitioner

## 2024-05-20 ENCOUNTER — Other Ambulatory Visit: Payer: Self-pay

## 2024-05-23 ENCOUNTER — Other Ambulatory Visit: Payer: Self-pay

## 2024-06-01 DIAGNOSIS — Z0289 Encounter for other administrative examinations: Secondary | ICD-10-CM

## 2024-06-09 NOTE — Progress Notes (Signed)
 48 y.o. G6P2002 female with AUB, fibroids, endometrial polyp, Mirena  IUD (inserted 10/25/2020), hypertension, OSA, GERD, prediabetes (most recent A1c 5.6), declines  here for preoperative exam for robotic assisted total laparoscopic hysterectomy, bilateral salpingectomy, cystoscopy and Mirena  IUD removal. [facility] RN first training and wellness labs. Married, vasectomy. Planning cruise to [place]  end of May.  No LMP recorded. (Menstrual status: IUD).  Patient underwent prior hysteroscopy with polypectomy in December 2021 for endometrial polyp.  Benign pathology.  Reports irregular spotting since Mirena  insertion. Now with heavier bleeding, passes clots and cramping with bleeding. Bleeding is irregular. She also notes daily increased vaginal discharge. Wearing a pad for this. Mild right lower back pain.  Completed child bearing. No CP or SOB.  02/09/2024 TVUS: Retroverted size and shape 9.26x6.83 x5.86 cm.  5 fibroids measured, the largest measuring 2.9 cm.  Thin symmetrical endometrium 2.86 mm fluid filled with a 18x96mm polypoid area. IUD is located either adjacent to or inside the polyp.  Complex cysts seen on both ovaries 21x62mm right, 24x5mm left  Free fluid in the culdesac   Impression: Endometrial polyp, cysts bilateral ovaries, fibroids  Last PAP:    Component Value Date/Time   DIAGPAP  02/17/2024 1447    - Negative for intraepithelial lesion or malignancy (NILM)   HPVHIGH Negative 02/17/2024 1447   ADEQPAP  02/17/2024 1447    Satisfactory for evaluation; transformation zone component PRESENT.   02/17/24 pathology: A. ENDOMETRIUM, BIOPSY:       Benign inactive endometrium with stromal pseudodecidualization  consistent with exogenous progestin effect.       Negative for hyperplasia, atypia or malignancy.   Sexually active: yes   GYN HISTORY: Prior hysteroscopy, as noted, 2021  OB History  Gravida Para Term Preterm AB Living  2 2 2   2   SAB IAB Ectopic Multiple Live  Births      2    # Outcome Date GA Lbr Len/2nd Weight Sex Type Anes PTL Lv  2 Term 08/25/05   9 lb 6 oz (4.252 kg) F Vag-Spont   LIV  1 Term 05/04/04   7 lb 6 oz (3.345 kg) M Vag-Spont   LIV    Past Medical History:  Diagnosis Date   Anemia    Anxiety 2014   Arthritis    right arm, right knee   Chicken pox    Depression    Diabetes mellitus without complication (HCC)    Frequent headaches    GERD (gastroesophageal reflux disease)    Hyperlipemia    Hypertension    Obesity (BMI 30-39.9)    Pre-diabetes    Sleep apnea    CPAP   Trigger finger    MIDDLE FINGER RIGHT HAND SURGERY ON JULY 31ST 2019    Past Surgical History:  Procedure Laterality Date   COLONOSCOPY WITH PROPOFOL  N/A 07/18/2019   Procedure: COLONOSCOPY WITH PROPOFOL ;  Surgeon: Jinny Carmine, MD;  Location: [facility];  Service: Endoscopy;  Laterality: N/A;  Latex Sleep apnea   HYSTEROSCOPY WITH D & C N/A 05/20/2018   Procedure: DILATATION AND CURETTAGE /HYSTEROSCOPY;  Surgeon: Lake Read, MD;  Location: [facility];  Service: Gynecology;  Laterality: N/A;   HYSTEROSCOPY WITH D & C N/A 10/25/2020   Procedure: DILATATION AND CURETTAGE /HYSTEROSCOPY;  Surgeon: Lake Read, MD;  Location: [facility];  Service: Gynecology;  Laterality: N/A;   INTRAUTERINE DEVICE (IUD) INSERTION N/A 05/20/2018   Procedure: INTRAUTERINE DEVICE (IUD) INSERTION;  Surgeon: Lake Read, MD;  Location: [facility];  Service: Gynecology;  Laterality: N/A;   INTRAUTERINE DEVICE (IUD) INSERTION  10/25/2020   Procedure: INTRAUTERINE DEVICE (IUD) INSERTION;  Surgeon: Lake Read, MD;  Location: [facility];  Service: Gynecology;;   WISDOM TOOTH EXTRACTION      Current Outpatient Medications on File Prior to Visit  Medication Sig Dispense Refill   ALPRAZolam  (XANAX ) 0.5 MG tablet Take 1 tablet (0.5 mg total) by mouth daily as needed for anxiety. 30 tablet 0   b complex vitamins capsule Take 1 capsule by mouth daily.      Cholecalciferol (VITAMIN D3) 125 MCG (5000 UT) CAPS Take 5,000 Units by mouth daily.     [facility] TRIPLE MAGNESIUM COMPLEX PO      Cyanocobalamin (B-12) 1000 MCG TABS Take 1,000 mcg by mouth daily.     docusate sodium (COLACE) 100 MG capsule Take 100 mg by mouth daily.      ELDERBERRY PO Take 1 each by mouth 2 (two) times daily. Gummies     escitalopram  (LEXAPRO ) 20 MG tablet Take 1 tablet (20 mg total) by mouth daily. 90 tablet 3   ferrous sulfate 325 (65 FE) MG tablet Take 325 mg by mouth daily with breakfast.      levonorgestrel  (MIRENA , 52 MG,) 20 MCG/DAY IUD 1 each by Intrauterine route once.     metoprolol  succinate (TOPROL  XL) 50 MG 24 hr tablet Take 1 tablet (50 mg total) by mouth daily. Take with or immediately following a meal. Stop HCTZ and Toprol  25 90 tablet 3   oxybutynin  (DITROPAN  XL) 5 MG 24 hr tablet Take 1 tablet (5 mg total) by mouth at bedtime. 90 tablet 3   pravastatin  (PRAVACHOL ) 40 MG tablet Take 1 tablet (40 mg total) by mouth daily. 90 tablet 3   Semaglutide , 2 MG/DOSE, 8 MG/3ML SOPN Inject 2 mg into the skin once a week. 9 mL 3   TURMERIC PO Take 500 mg by mouth daily. With ginger      No current facility-administered medications on file prior to visit.    Allergies  Allergen Reactions   Latex Itching    GLOVES (extended exposure)      PE Today's Vitals   06/10/24 1036  BP: 124/71  Pulse: 90  Temp: 98.2 F (36.8 C)  TempSrc: Oral  SpO2: 90%  Weight: 225 lb (102.1 kg)  Height: 5' 6 (1.676 m)   Body mass index is 36.32 kg/m.  Physical Exam Vitals reviewed.  Constitutional:      General: She is not in acute distress.    Appearance: Normal appearance.  HENT:     Head: Normocephalic and atraumatic.     Nose: Nose normal.  Eyes:     Extraocular Movements: Extraocular movements intact.     Conjunctiva/sclera: Conjunctivae normal.  Cardiovascular:     Rate and Rhythm: Normal rate and regular rhythm.     Heart sounds: No murmur heard.    No friction  rub. No gallop.  Pulmonary:     Effort: Pulmonary effort is normal. No respiratory distress.     Breath sounds: Normal breath sounds. No stridor. No wheezing, rhonchi or rales.  Musculoskeletal:        General: Normal range of motion.     Cervical back: Normal range of motion.  Neurological:     General: No focal deficit present.     Mental Status: She is alert.  Psychiatric:        Mood and Affect: Mood normal.  Behavior: Behavior normal.       Assessment and Plan:        Menorrhagia with irregular cycle Endometrial polyp Fibroids Uses hormone releasing intrauterine device (IUD) for contraception Assessment & Plan: AUB is likely multifactorial given fibroids and endometrial polyp, however currently poorly controlled with Mirena  IUD Presents for preoperative exam, procedure scheduled 06/29/2024.  Plan for robotic assisted total laparoscopic hysterectomy, bilateral salpingectomy, cystoscopy, Mirena  IUD removal. Discussed outpatient procedure. Reviewed that  recovery is usually 6 weeks with additional 4 weeks of pelvic rest. Risks including infections, bleeding, and damage to surrounding organs reviewed.  Declined blood transfusion.  Preop checklist: Meds and allergies reviewed. Antibiotics: Ancef, flagyl DVT ppx: SCDs, lovenox Postop visit: 2, 6 week Additional clearance: medical > cleared 04/13/24 Tubal and hysterectomy papers signed: N/A   Orders: -     Acetaminophen ; Take 2 tablets (1,000 mg total) by mouth every 8 (eight) hours as needed.  Dispense: 90 tablet; Refill: 0 -     Ibuprofen ; Take 1 tablet (800 mg total) by mouth every 8 (eight) hours as needed for up to 42 doses.  Dispense: 42 tablet; Refill: 0 -     oxyCODONE  HCl; On day 1, take 1 tablet by mouth every 4 hours as needed. On day 2, take 1 tablet by mouth every 6 hours as needed. On day 3, take 1 tablet by mouth every 8 hours as needed. On day 4, take 1 tablet by mouth every 12 hours as needed.  Dispense: 15  tablet; Refill: 0    Vera LULLA Pa, MD

## 2024-06-09 NOTE — H&P (View-Only) (Signed)
 48 y.o. G6P2002 female with AUB, fibroids, endometrial polyp, Mirena  IUD (inserted 10/25/2020), hypertension, OSA, GERD, prediabetes (most recent A1c 5.6), declines  here for preoperative exam for robotic assisted total laparoscopic hysterectomy, bilateral salpingectomy, cystoscopy and Mirena  IUD removal. [facility] RN first training and wellness labs. Married, vasectomy. Planning cruise to [place]  end of May.  No LMP recorded. (Menstrual status: IUD).  Patient underwent prior hysteroscopy with polypectomy in December 2021 for endometrial polyp.  Benign pathology.  Reports irregular spotting since Mirena  insertion. Now with heavier bleeding, passes clots and cramping with bleeding. Bleeding is irregular. She also notes daily increased vaginal discharge. Wearing a pad for this. Mild right lower back pain.  Completed child bearing. No CP or SOB.  02/09/2024 TVUS: Retroverted size and shape 9.26x6.83 x5.86 cm.  5 fibroids measured, the largest measuring 2.9 cm.  Thin symmetrical endometrium 2.86 mm fluid filled with a 18x96mm polypoid area. IUD is located either adjacent to or inside the polyp.  Complex cysts seen on both ovaries 21x62mm right, 24x5mm left  Free fluid in the culdesac   Impression: Endometrial polyp, cysts bilateral ovaries, fibroids  Last PAP:    Component Value Date/Time   DIAGPAP  02/17/2024 1447    - Negative for intraepithelial lesion or malignancy (NILM)   HPVHIGH Negative 02/17/2024 1447   ADEQPAP  02/17/2024 1447    Satisfactory for evaluation; transformation zone component PRESENT.   02/17/24 pathology: A. ENDOMETRIUM, BIOPSY:       Benign inactive endometrium with stromal pseudodecidualization  consistent with exogenous progestin effect.       Negative for hyperplasia, atypia or malignancy.   Sexually active: yes   GYN HISTORY: Prior hysteroscopy, as noted, 2021  OB History  Gravida Para Term Preterm AB Living  2 2 2   2   SAB IAB Ectopic Multiple Live  Births      2    # Outcome Date GA Lbr Len/2nd Weight Sex Type Anes PTL Lv  2 Term 08/25/05   9 lb 6 oz (4.252 kg) F Vag-Spont   LIV  1 Term 05/04/04   7 lb 6 oz (3.345 kg) M Vag-Spont   LIV    Past Medical History:  Diagnosis Date   Anemia    Anxiety 2014   Arthritis    right arm, right knee   Chicken pox    Depression    Diabetes mellitus without complication (HCC)    Frequent headaches    GERD (gastroesophageal reflux disease)    Hyperlipemia    Hypertension    Obesity (BMI 30-39.9)    Pre-diabetes    Sleep apnea    CPAP   Trigger finger    MIDDLE FINGER RIGHT HAND SURGERY ON JULY 31ST 2019    Past Surgical History:  Procedure Laterality Date   COLONOSCOPY WITH PROPOFOL  N/A 07/18/2019   Procedure: COLONOSCOPY WITH PROPOFOL ;  Surgeon: Jinny Carmine, MD;  Location: [facility];  Service: Endoscopy;  Laterality: N/A;  Latex Sleep apnea   HYSTEROSCOPY WITH D & C N/A 05/20/2018   Procedure: DILATATION AND CURETTAGE /HYSTEROSCOPY;  Surgeon: Lake Read, MD;  Location: [facility];  Service: Gynecology;  Laterality: N/A;   HYSTEROSCOPY WITH D & C N/A 10/25/2020   Procedure: DILATATION AND CURETTAGE /HYSTEROSCOPY;  Surgeon: Lake Read, MD;  Location: [facility];  Service: Gynecology;  Laterality: N/A;   INTRAUTERINE DEVICE (IUD) INSERTION N/A 05/20/2018   Procedure: INTRAUTERINE DEVICE (IUD) INSERTION;  Surgeon: Lake Read, MD;  Location: [facility];  Service: Gynecology;  Laterality: N/A;   INTRAUTERINE DEVICE (IUD) INSERTION  10/25/2020   Procedure: INTRAUTERINE DEVICE (IUD) INSERTION;  Surgeon: Lake Read, MD;  Location: [facility];  Service: Gynecology;;   WISDOM TOOTH EXTRACTION      Current Outpatient Medications on File Prior to Visit  Medication Sig Dispense Refill   ALPRAZolam  (XANAX ) 0.5 MG tablet Take 1 tablet (0.5 mg total) by mouth daily as needed for anxiety. 30 tablet 0   b complex vitamins capsule Take 1 capsule by mouth daily.      Cholecalciferol (VITAMIN D3) 125 MCG (5000 UT) CAPS Take 5,000 Units by mouth daily.     [employer] TRIPLE MAGNESIUM COMPLEX PO      Cyanocobalamin (B-12) 1000 MCG TABS Take 1,000 mcg by mouth daily.     docusate sodium (COLACE) 100 MG capsule Take 100 mg by mouth daily.      ELDERBERRY PO Take 1 each by mouth 2 (two) times daily. Gummies     escitalopram  (LEXAPRO ) 20 MG tablet Take 1 tablet (20 mg total) by mouth daily. 90 tablet 3   ferrous sulfate 325 (65 FE) MG tablet Take 325 mg by mouth daily with breakfast.      levonorgestrel  (MIRENA , 52 MG,) 20 MCG/DAY IUD 1 each by Intrauterine route once.     metoprolol  succinate (TOPROL  XL) 50 MG 24 hr tablet Take 1 tablet (50 mg total) by mouth daily. Take with or immediately following a meal. Stop HCTZ and Toprol  25 90 tablet 3   oxybutynin  (DITROPAN  XL) 5 MG 24 hr tablet Take 1 tablet (5 mg total) by mouth at bedtime. 90 tablet 3   pravastatin  (PRAVACHOL ) 40 MG tablet Take 1 tablet (40 mg total) by mouth daily. 90 tablet 3   Semaglutide , 2 MG/DOSE, 8 MG/3ML SOPN Inject 2 mg into the skin once a week. 9 mL 3   TURMERIC PO Take 500 mg by mouth daily. With ginger      No current facility-administered medications on file prior to visit.    Allergies  Allergen Reactions   Latex Itching    GLOVES (extended exposure)      PE Today's Vitals   06/10/24 1036  BP: 124/71  Pulse: 90  Temp: 98.2 F (36.8 C)  TempSrc: Oral  SpO2: 90%  Weight: 225 lb (102.1 kg)  Height: 5' 6 (1.676 m)   Body mass index is 36.32 kg/m.  Physical Exam Vitals reviewed.  Constitutional:      General: She is not in acute distress.    Appearance: Normal appearance.  HENT:     Head: Normocephalic and atraumatic.     Nose: Nose normal.  Eyes:     Extraocular Movements: Extraocular movements intact.     Conjunctiva/sclera: Conjunctivae normal.  Cardiovascular:     Rate and Rhythm: Normal rate and regular rhythm.     Heart sounds: No murmur heard.    No friction  rub. No gallop.  Pulmonary:     Effort: Pulmonary effort is normal. No respiratory distress.     Breath sounds: Normal breath sounds. No stridor. No wheezing, rhonchi or rales.  Musculoskeletal:        General: Normal range of motion.     Cervical back: Normal range of motion.  Neurological:     General: No focal deficit present.     Mental Status: She is alert.  Psychiatric:        Mood and Affect: Mood normal.  Behavior: Behavior normal.       Assessment and Plan:        Menorrhagia with irregular cycle Endometrial polyp Fibroids Uses hormone releasing intrauterine device (IUD) for contraception Assessment & Plan: AUB is likely multifactorial given fibroids and endometrial polyp, however currently poorly controlled with Mirena  IUD Presents for preoperative exam, procedure scheduled 06/29/2024.  Plan for robotic assisted total laparoscopic hysterectomy, bilateral salpingectomy, cystoscopy, Mirena  IUD removal. Discussed outpatient procedure. Reviewed that  recovery is usually 6 weeks with additional 4 weeks of pelvic rest. Risks including infections, bleeding, and damage to surrounding organs reviewed.  Declined blood transfusion.  Preop checklist: Meds and allergies reviewed. Antibiotics: Ancef, flagyl DVT ppx: SCDs, lovenox Postop visit: 2, 6 week Additional clearance: medical > cleared 04/13/24 Tubal and hysterectomy papers signed: N/A   Orders: -     Acetaminophen ; Take 2 tablets (1,000 mg total) by mouth every 8 (eight) hours as needed.  Dispense: 90 tablet; Refill: 0 -     Ibuprofen ; Take 1 tablet (800 mg total) by mouth every 8 (eight) hours as needed for up to 42 doses.  Dispense: 42 tablet; Refill: 0 -     oxyCODONE  HCl; On day 1, take 1 tablet by mouth every 4 hours as needed. On day 2, take 1 tablet by mouth every 6 hours as needed. On day 3, take 1 tablet by mouth every 8 hours as needed. On day 4, take 1 tablet by mouth every 12 hours as needed.  Dispense: 15  tablet; Refill: 0    Vera LULLA Pa, MD

## 2024-06-10 ENCOUNTER — Other Ambulatory Visit: Payer: Self-pay

## 2024-06-10 ENCOUNTER — Ambulatory Visit (INDEPENDENT_AMBULATORY_CARE_PROVIDER_SITE_OTHER): Admitting: Obstetrics and Gynecology

## 2024-06-10 ENCOUNTER — Encounter: Payer: Self-pay | Admitting: Obstetrics and Gynecology

## 2024-06-10 VITALS — BP 124/71 | HR 90 | Temp 98.2°F | Ht 66.0 in | Wt 225.0 lb

## 2024-06-10 DIAGNOSIS — N840 Polyp of corpus uteri: Secondary | ICD-10-CM

## 2024-06-10 DIAGNOSIS — N921 Excessive and frequent menstruation with irregular cycle: Secondary | ICD-10-CM

## 2024-06-10 DIAGNOSIS — Z975 Presence of (intrauterine) contraceptive device: Secondary | ICD-10-CM | POA: Diagnosis not present

## 2024-06-10 DIAGNOSIS — D219 Benign neoplasm of connective and other soft tissue, unspecified: Secondary | ICD-10-CM

## 2024-06-10 MED ORDER — IBUPROFEN 800 MG PO TABS
800.0000 mg | ORAL_TABLET | Freq: Three times a day (TID) | ORAL | 0 refills | Status: AC | PRN
  Filled 2024-06-10: qty 42, 14d supply, fill #0

## 2024-06-10 MED ORDER — ACETAMINOPHEN 500 MG PO TABS
1000.0000 mg | ORAL_TABLET | Freq: Three times a day (TID) | ORAL | 0 refills | Status: AC | PRN
  Filled 2024-06-10: qty 90, 15d supply, fill #0

## 2024-06-10 MED ORDER — OXYCODONE HCL 5 MG PO TABS
ORAL_TABLET | ORAL | 0 refills | Status: DC
  Filled 2024-06-10: qty 15, 4d supply, fill #0

## 2024-06-10 NOTE — Assessment & Plan Note (Addendum)
 AUB is likely multifactorial given fibroids and endometrial polyp, however currently poorly controlled with Mirena  IUD Presents for preoperative exam, procedure scheduled 06/29/2024.  Plan for robotic assisted total laparoscopic hysterectomy, bilateral salpingectomy, cystoscopy, Mirena  IUD removal. Discussed outpatient procedure. Reviewed that  recovery is usually 6 weeks with additional 4 weeks of pelvic rest. Risks including infections, bleeding, and damage to surrounding organs reviewed.  Declined blood transfusion.  Preop checklist: Meds and allergies reviewed. Antibiotics: Ancef, flagyl DVT ppx: SCDs, lovenox Postop visit: 2, 6 week Additional clearance: medical > cleared 04/13/24 Tubal and hysterectomy papers signed: N/A

## 2024-06-10 NOTE — Patient Instructions (Addendum)
 Preoperative instructions: Nothing to eat or drink after midnight, unless instructed differently regarding clear liquids by the anesthesia team at [facility]. Do not take any medications on the day of surgery, except those listed below: Metoprolol   Take your last dose of semaglutide  on 06/11/24. Do not take any further before your procedure. Please follow all other instructions as provided by our surgical scheduler at [facility] and the anesthesia team at [facility].  Postoperative instructions: Clean your incision daily with mild soapy water .  Sitz bath are helpful for wound healing.   Ensure that you thoroughly dry your wound following cleaning and keep dry throughout the day.  You can apply a thin layer of Aquaphor or Vaseline over your incision. Ice packs can be applied to your incision for up to 20 minutes at a time to help with pain and swelling. Take ibuprofen  as prescribed and over-the-counter Tylenol  as needed.

## 2024-06-12 ENCOUNTER — Encounter: Payer: Self-pay | Admitting: Obstetrics and Gynecology

## 2024-06-17 ENCOUNTER — Encounter: Payer: Self-pay | Admitting: *Deleted

## 2024-06-22 ENCOUNTER — Encounter (HOSPITAL_COMMUNITY): Payer: Self-pay | Admitting: Obstetrics and Gynecology

## 2024-06-22 NOTE — Pre-Procedure Instructions (Signed)
 Surgical Instructions   Your procedure is scheduled on :  Wednesday,  06-29-2024. Report to [facility] Main Entrance A at 6:30 AM A.M., then check in with the Admitting office. Any questions or running late day of surgery: call [phone]  Questions prior to your surgery date: call [phone], Monday-Friday, 8am-4pm. If you experience any cold or flu symptoms such as cough, fever, chills, shortness of breath, etc. between now and your scheduled surgery, please notify your surgeon office.    Remember:  Do not eat any food and do not drink any liquids after midnight the night before your surgery.  This includes no water ,  candy,  gum,  and  mints.    Take these medicines the morning of surgery with A SIPS OF WATER  :   Metoprolol  (toprol )   May take these medicines IF NEEDED: Alprazolam  (xanax )   One week prior to surgery, STOP taking any Aspirin  (unless otherwise instructed by your surgeon) Aleve, Naproxen, Ibuprofen , Motrin , Advil , Goody's, BC's, all herbal medications, fish oil, and non-prescription vitamins.                     Do NOT Smoke (Tobacco/Vaping) and Do Not drink alcohol for 24 hours prior to your procedure.  ~~Please bring your CPAP/ Mask/ Tubing with your day of surgery .   You will be asked to remove any contacts, glasses, piercing's, hearing aid's, dentures/partials prior to surgery. Please bring cases for these items if needed.    Patients discharged the day of surgery will not be allowed to drive home, and someone needs to stay with them for 24 hours.  SURGICAL WAITING ROOM VISITATION Patients may have no more than 2 support people in the waiting area - these visitors may rotate.   Pre-op nurse will coordinate an appropriate time for 1 ADULT support person, who may not rotate, to accompany patient in pre-op.  Children under the age of 34 must have an adult with them who is not the patient and must remain in the main waiting area with an adult.  If the  patient needs to stay at the hospital during part of their recovery, the visitor guidelines for inpatient rooms apply.  Please refer to the [facility] website for the visitor guidelines for any additional information.   If you received a COVID test during your pre-op visit  it is requested that you wear a mask when out in public, stay away from anyone that may not be feeling well and notify your surgeon if you develop symptoms. If you have been in contact with anyone that has tested positive in the last 10 days please notify you surgeon.      Pre-operative CHG Bathing Instructions   You can play a key role in reducing the risk of infection after surgery. Your skin needs to be as free of germs as possible. You can reduce the number of germs on your skin by washing with CHG (chlorhexidine  gluconate) soap before surgery. CHG is an antiseptic soap that kills germs and continues to kill germs even after washing.   DO NOT use if you have an allergy to chlorhexidine /CHG or antibacterial soaps. If your skin becomes reddened or irritated, stop using the CHG and notify one of our RN day of surgery.             TAKE A SHOWER THE NIGHT BEFORE SURGERY AND THE DAY OF SURGERY    Please keep in mind the following:  DO NOT  shave, including legs and genital area, 48 hours prior to surgery.   You may shave your face before/day of surgery.  Place clean sheets on your bed the night before surgery Use a clean washcloth (not used since being washed) for each shower. DO NOT sleep with pet's night before surgery.  CHG Shower Instructions:  Wash your face and private area with normal soap. If you choose to wash your hair, wash first with your normal shampoo.  After you use shampoo/soap, rinse your hair and body thoroughly to remove shampoo/soap residue.  Turn the water  OFF and apply half the bottle of CHG soap to a CLEAN washcloth.  Apply CHG soap ONLY FROM YOUR NECK DOWN TO YOUR TOES (washing for 3-5 minutes)   DO NOT use CHG soap on face, private areas, open wounds, or sores.  Pay special attention to the area where your surgery is being performed.  If you are having back surgery, having someone wash your back for you may be helpful. Wait 2 minutes after CHG soap is applied, then you may rinse off the CHG soap.  Pat dry with a clean towel  Put on clean pajamas    Additional instructions for the day of surgery: DO NOT APPLY any lotions,  powder,  oils,  deodorants (may use underarm deodorant) , cologne/ perfumes  or makeup Do not wear jewelry / piercing's/ metal/ permanent jewelry must be removed prior to arrival day of surgery.  (No plastic piercing) Do not wear nail polish, gel polish, artificial nails, or any other type of covering on natural finger nails (toe nails are okay) Do not bring valuables to the hospital. [facility] is not responsible for valuables/personal belongings. Put on clean/comfortable clothes.  Please brush your teeth.  Ask your nurse before applying any prescription medications to the skin.

## 2024-06-22 NOTE — Progress Notes (Addendum)
 Spoke w/ via phone for pre-op interview--- pt Lab needs dos----   upt    (per anes)  Lab results------ lab appt 06-24-2024 @ 1300 getting CBC/ BMP/ A1c/ EKG (per anes) COVID test -----patient states asymptomatic no test needed Arrive at ------- 0630 on 06-29-2024 NPO after MN w/ exception sips of water  w/ meds Pre-Surgery Ensure or G2:  n/a  Med rec completed Medications to take morning of surgery ----- toprol  Diabetic medication ----- n/a  GLP1 agonist last dose:  06-11-2024 GLP1 instructions: pt stated surgeon gave instructions about stopping prior to surgery   Patient instructed no nail polish to be worn day of surgery Patient instructed to bring photo id and insurance card day of surgery Patient aware to have Driver (ride ) / caregiver    for 24 hours after surgery - husband, michael Caraway.  Asked to bring her cpap/ mask/ tubing dos Patient Special Instructions ----- will pick up soap and written instructions at lab appt Pre-Op special Instructions -----  n/a  Patient verbalized understanding of instructions that were given at this phone interview. Patient denies chest pain, sob, fever, cough at the interview.

## 2024-06-24 ENCOUNTER — Encounter (HOSPITAL_COMMUNITY)
Admission: RE | Admit: 2024-06-24 | Discharge: 2024-06-24 | Disposition: A | Discharge status: Home / Self Care | Source: Ambulatory Visit | Attending: Obstetrics and Gynecology | Admitting: Obstetrics and Gynecology

## 2024-06-24 DIAGNOSIS — N921 Excessive and frequent menstruation with irregular cycle: Secondary | ICD-10-CM | POA: Diagnosis not present

## 2024-06-24 DIAGNOSIS — Z01818 Encounter for other preprocedural examination: Secondary | ICD-10-CM | POA: Insufficient documentation

## 2024-06-24 DIAGNOSIS — Z975 Presence of (intrauterine) contraceptive device: Secondary | ICD-10-CM | POA: Diagnosis not present

## 2024-06-24 DIAGNOSIS — N840 Polyp of corpus uteri: Secondary | ICD-10-CM | POA: Insufficient documentation

## 2024-06-24 DIAGNOSIS — N852 Hypertrophy of uterus: Secondary | ICD-10-CM

## 2024-06-24 DIAGNOSIS — D219 Benign neoplasm of connective and other soft tissue, unspecified: Secondary | ICD-10-CM | POA: Diagnosis not present

## 2024-06-24 LAB — BASIC METABOLIC PANEL WITH GFR
Anion gap: 10 (ref 5–15)
BUN: 9 mg/dL (ref 6–20)
CO2: 28 mmol/L (ref 22–32)
Calcium: 9.5 mg/dL (ref 8.9–10.3)
Chloride: 99 mmol/L (ref 98–111)
Creatinine, Ser: 0.69 mg/dL (ref 0.44–1.00)
GFR, Estimated: >60 mL/min (ref >60)
Glucose, Bld: 93 mg/dL (ref 70–99)
Potassium: 3.9 mmol/L (ref 3.5–5.1)
Sodium: 137 mmol/L (ref 135–145)

## 2024-06-24 LAB — CBC
HCT: 38 % (ref 36.0–46.0)
Hemoglobin: 12.2 g/dL (ref 12.0–15.0)
MCH: 30.2 pg (ref 26.0–34.0)
MCHC: 32.1 g/dL (ref 30.0–36.0)
MCV: 94.1 fL (ref 80.0–100.0)
Platelets: 325 K/uL (ref 150–400)
RBC: 4.04 MIL/uL (ref 3.87–5.11)
RDW: 12.9 % (ref 11.5–15.5)
WBC: 6.6 K/uL (ref 4.0–10.5)
nRBC: 0 % (ref 0.0–0.2)

## 2024-06-25 LAB — HEMOGLOBIN A1C
Hgb A1c MFr Bld: 5.5 % (ref 4.8–5.6)
Mean Plasma Glucose: 111 mg/dL

## 2024-06-28 NOTE — Anesthesia Preprocedure Evaluation (Signed)
 Anesthesia Evaluation  Patient identified by MRN, date of birth, ID band Patient awake    Reviewed: Allergy & Precautions, NPO status , Patient's Chart, lab work & pertinent test results, reviewed documented beta blocker date and time   Airway Mallampati: II  TM Distance: >3 FB Neck ROM: Full    Dental  (+) Teeth Intact, Dental Advisory Given   Pulmonary sleep apnea and Continuous Positive Airway Pressure Ventilation    Pulmonary exam normal breath sounds clear to auscultation       Cardiovascular hypertension, Pt. on home beta blockers Normal cardiovascular exam Rhythm:Regular Rate:Normal     Neuro/Psych  Headaches PSYCHIATRIC DISORDERS Anxiety Depression       GI/Hepatic Neg liver ROS,GERD  ,,  Endo/Other  diabetes  Obesity   Renal/GU negative Renal ROS     Musculoskeletal  (+) Arthritis ,    Abdominal   Peds  Hematology negative hematology ROS (+)   Anesthesia Other Findings   Reproductive/Obstetrics menorrhagia with irregular cycle, fibroids, endometrial polyp                              Anesthesia Physical Anesthesia Plan  ASA: 3  Anesthesia Plan: General   Post-op Pain Management: Tylenol  PO (pre-op)*, Gabapentin  PO (pre-op)* and Celebrex  PO (pre-op)*   Induction: Intravenous  PONV Risk Score and Plan: 4 or greater and Scopolamine  patch - Pre-op, Midazolam , Dexamethasone  and Ondansetron   Airway Management Planned: Oral ETT  Additional Equipment:   Intra-op Plan:   Post-operative Plan: Extubation in OR  Informed Consent: I have reviewed the patients History and Physical, chart, labs and discussed the procedure including the risks, benefits and alternatives for the proposed anesthesia with the patient or authorized representative who has indicated his/her understanding and acceptance.     Dental advisory given  Plan Discussed with: CRNA  Anesthesia Plan Comments:  (2nd PIV after induction)         Anesthesia Quick Evaluation

## 2024-06-29 ENCOUNTER — Ambulatory Visit (HOSPITAL_COMMUNITY)
Admission: RE | Admit: 2024-06-29 | Discharge: 2024-06-29 | Disposition: A | Discharge status: Home / Self Care | Attending: Obstetrics and Gynecology | Admitting: Obstetrics and Gynecology

## 2024-06-29 ENCOUNTER — Encounter (HOSPITAL_COMMUNITY)
Admission: RE | Disposition: A | Discharge status: Home / Self Care | Payer: Self-pay | Source: Home / Self Care | Attending: Obstetrics and Gynecology

## 2024-06-29 ENCOUNTER — Other Ambulatory Visit: Payer: Self-pay

## 2024-06-29 ENCOUNTER — Encounter (HOSPITAL_COMMUNITY): Payer: Self-pay | Admitting: Obstetrics and Gynecology

## 2024-06-29 ENCOUNTER — Ambulatory Visit (HOSPITAL_COMMUNITY): Payer: Self-pay | Admitting: Anesthesiology

## 2024-06-29 DIAGNOSIS — F418 Other specified anxiety disorders: Secondary | ICD-10-CM

## 2024-06-29 DIAGNOSIS — E119 Type 2 diabetes mellitus without complications: Secondary | ICD-10-CM | POA: Insufficient documentation

## 2024-06-29 DIAGNOSIS — Z01818 Encounter for other preprocedural examination: Secondary | ICD-10-CM

## 2024-06-29 DIAGNOSIS — D219 Benign neoplasm of connective and other soft tissue, unspecified: Secondary | ICD-10-CM | POA: Diagnosis not present

## 2024-06-29 DIAGNOSIS — D259 Leiomyoma of uterus, unspecified: Secondary | ICD-10-CM

## 2024-06-29 DIAGNOSIS — I10 Essential (primary) hypertension: Secondary | ICD-10-CM | POA: Diagnosis not present

## 2024-06-29 DIAGNOSIS — N840 Polyp of corpus uteri: Secondary | ICD-10-CM

## 2024-06-29 DIAGNOSIS — N858 Other specified noninflammatory disorders of uterus: Secondary | ICD-10-CM | POA: Diagnosis not present

## 2024-06-29 DIAGNOSIS — Z975 Presence of (intrauterine) contraceptive device: Secondary | ICD-10-CM

## 2024-06-29 DIAGNOSIS — N921 Excessive and frequent menstruation with irregular cycle: Secondary | ICD-10-CM

## 2024-06-29 DIAGNOSIS — Z6837 Body mass index (BMI) 37.0-37.9, adult: Secondary | ICD-10-CM | POA: Insufficient documentation

## 2024-06-29 DIAGNOSIS — E669 Obesity, unspecified: Secondary | ICD-10-CM | POA: Diagnosis not present

## 2024-06-29 DIAGNOSIS — F32A Depression, unspecified: Secondary | ICD-10-CM | POA: Diagnosis not present

## 2024-06-29 DIAGNOSIS — Z7985 Long-term (current) use of injectable non-insulin antidiabetic drugs: Secondary | ICD-10-CM | POA: Diagnosis not present

## 2024-06-29 DIAGNOSIS — G4733 Obstructive sleep apnea (adult) (pediatric): Secondary | ICD-10-CM | POA: Diagnosis not present

## 2024-06-29 DIAGNOSIS — N852 Hypertrophy of uterus: Secondary | ICD-10-CM

## 2024-06-29 HISTORY — DX: Leiomyoma of uterus, unspecified: D25.9

## 2024-06-29 HISTORY — PX: IUD REMOVAL: SHX5392

## 2024-06-29 HISTORY — DX: Dry eye syndrome of bilateral lacrimal glands: H04.123

## 2024-06-29 HISTORY — DX: Presence of spectacles and contact lenses: Z97.3

## 2024-06-29 HISTORY — PX: CYSTOSCOPY: SHX5120

## 2024-06-29 HISTORY — DX: Iron deficiency anemia, unspecified: D50.9

## 2024-06-29 HISTORY — DX: Type 2 diabetes mellitus without complications: E11.9

## 2024-06-29 HISTORY — DX: Hyperlipidemia, unspecified: E78.5

## 2024-06-29 HISTORY — PX: HYSTERECTOMY, TOTAL, LAPAROSCOPIC, ROBOT-ASSISTED WITH SALPINGECTOMY: SHX7587

## 2024-06-29 HISTORY — DX: Obstructive sleep apnea (adult) (pediatric): G47.33

## 2024-06-29 HISTORY — DX: Polyp of corpus uteri: N84.0

## 2024-06-29 HISTORY — DX: Overactive bladder: N32.81

## 2024-06-29 HISTORY — DX: Excessive and frequent menstruation with irregular cycle: N92.1

## 2024-06-29 HISTORY — DX: Unspecified temporomandibular joint disorder, unspecified side: M26.609

## 2024-06-29 LAB — TYPE AND SCREEN
ABO/RH(D): O POS
Antibody Screen: NEGATIVE

## 2024-06-29 LAB — GLUCOSE, CAPILLARY
Glucose-Capillary: 96 mg/dL (ref 70–99)
Glucose-Capillary: 160 mg/dL — ABNORMAL HIGH (ref 70–99)

## 2024-06-29 LAB — POCT PREGNANCY, URINE: Preg Test, Ur: NEGATIVE

## 2024-06-29 LAB — NO BLOOD PRODUCTS

## 2024-06-29 SURGERY — HYSTERECTOMY, TOTAL, LAPAROSCOPIC, ROBOT-ASSISTED WITH SALPINGECTOMY
Anesthesia: General

## 2024-06-29 MED ORDER — SCOPOLAMINE 1 MG/3DAYS TD PT72
1.0000 | MEDICATED_PATCH | TRANSDERMAL | Status: DC
  Administered 2024-06-29: 1 mg via TRANSDERMAL

## 2024-06-29 MED ORDER — GABAPENTIN 300 MG PO CAPS
300.0000 mg | ORAL_CAPSULE | ORAL | Status: AC
  Administered 2024-06-29: 300 mg via ORAL

## 2024-06-29 MED ORDER — FENTANYL CITRATE (PF) 100 MCG/2ML IJ SOLN
INTRAMUSCULAR | Status: AC
  Filled 2024-06-29: qty 2

## 2024-06-29 MED ORDER — LACTATED RINGERS IV SOLN
INTRAVENOUS | Status: DC
Start: 2024-06-29 — End: 2024-06-29

## 2024-06-29 MED ORDER — OXYCODONE HCL 5 MG PO TABS
5.0000 mg | ORAL_TABLET | Freq: Once | ORAL | Status: AC
  Administered 2024-06-29: 5 mg via ORAL

## 2024-06-29 MED ORDER — CHLORHEXIDINE GLUCONATE 0.12 % MT SOLN
OROMUCOSAL | Status: AC
  Filled 2024-06-29: qty 15

## 2024-06-29 MED ORDER — GABAPENTIN 300 MG PO CAPS
ORAL_CAPSULE | ORAL | Status: AC
  Filled 2024-06-29: qty 1

## 2024-06-29 MED ORDER — PROPOFOL 10 MG/ML IV BOLUS
INTRAVENOUS | Status: DC | PRN
  Administered 2024-06-29: 160 mg via INTRAVENOUS

## 2024-06-29 MED ORDER — CHLORHEXIDINE GLUCONATE 0.12 % MT SOLN
15.0000 mL | Freq: Once | OROMUCOSAL | Status: AC
  Administered 2024-06-29: 15 mL via OROMUCOSAL

## 2024-06-29 MED ORDER — PHENYLEPHRINE HCL-NACL 20-0.9 MG/250ML-% IV SOLN
INTRAVENOUS | Status: DC | PRN
  Administered 2024-06-29: 40 ug/min via INTRAVENOUS

## 2024-06-29 MED ORDER — SCOPOLAMINE 1 MG/3DAYS TD PT72
MEDICATED_PATCH | TRANSDERMAL | Status: AC
  Filled 2024-06-29: qty 1

## 2024-06-29 MED ORDER — INSULIN ASPART 100 UNIT/ML IJ SOLN: 0.0000 [IU] | INTRAMUSCULAR | Status: DC | PRN

## 2024-06-29 MED ORDER — ENOXAPARIN SODIUM 40 MG/0.4ML IJ SOSY
PREFILLED_SYRINGE | INTRAMUSCULAR | Status: AC
  Filled 2024-06-29: qty 0.4

## 2024-06-29 MED ORDER — 0.9 % SODIUM CHLORIDE (POUR BTL) OPTIME
TOPICAL | Status: DC | PRN
  Administered 2024-06-29: 1000 mL

## 2024-06-29 MED ORDER — LIDOCAINE 2% (20 MG/ML) 5 ML SYRINGE
INTRAMUSCULAR | Status: DC | PRN
  Administered 2024-06-29: 80 mg via INTRAVENOUS

## 2024-06-29 MED ORDER — CELECOXIB 200 MG PO CAPS
ORAL_CAPSULE | ORAL | Status: AC
  Filled 2024-06-29: qty 2

## 2024-06-29 MED ORDER — ONDANSETRON HCL 4 MG/2ML IJ SOLN
INTRAMUSCULAR | Status: AC
Start: 2024-06-29 — End: 2024-06-29
  Filled 2024-06-29: qty 2

## 2024-06-29 MED ORDER — FENTANYL CITRATE (PF) 250 MCG/5ML IJ SOLN
INTRAMUSCULAR | Status: DC | PRN
  Administered 2024-06-29 (×3): 50 ug via INTRAVENOUS
  Administered 2024-06-29: 100 ug via INTRAVENOUS
  Administered 2024-06-29: 50 ug via INTRAVENOUS

## 2024-06-29 MED ORDER — METRONIDAZOLE 500 MG/100ML IV SOLN
INTRAVENOUS | Status: AC
  Filled 2024-06-29: qty 100

## 2024-06-29 MED ORDER — FENTANYL CITRATE (PF) 100 MCG/2ML IJ SOLN
25.0000 ug | INTRAMUSCULAR | Status: DC | PRN
  Administered 2024-06-29 (×2): 25 ug via INTRAVENOUS

## 2024-06-29 MED ORDER — DEXAMETHASONE SODIUM PHOSPHATE 10 MG/ML IJ SOLN
INTRAMUSCULAR | Status: DC | PRN
  Administered 2024-06-29: 10 mg via INTRAVENOUS

## 2024-06-29 MED ORDER — ONDANSETRON HCL 4 MG/2ML IJ SOLN
INTRAMUSCULAR | Status: DC | PRN
  Administered 2024-06-29: 4 mg via INTRAVENOUS

## 2024-06-29 MED ORDER — ACETAMINOPHEN 500 MG PO TABS
1000.0000 mg | ORAL_TABLET | ORAL | Status: AC
Start: 2024-06-29 — End: 2024-06-29
  Administered 2024-06-29: 1000 mg via ORAL

## 2024-06-29 MED ORDER — DEXAMETHASONE SODIUM PHOSPHATE 10 MG/ML IJ SOLN
INTRAMUSCULAR | Status: AC
  Filled 2024-06-29: qty 1

## 2024-06-29 MED ORDER — AMISULPRIDE (ANTIEMETIC) 5 MG/2ML IV SOLN: 10.0000 mg | Freq: Once | INTRAVENOUS | Status: DC | PRN

## 2024-06-29 MED ORDER — FENTANYL CITRATE (PF) 250 MCG/5ML IJ SOLN
INTRAMUSCULAR | Status: AC
  Filled 2024-06-29: qty 5

## 2024-06-29 MED ORDER — SODIUM CHLORIDE 0.9 % IR SOLN
Status: DC | PRN
  Administered 2024-06-29: 1000 mL

## 2024-06-29 MED ORDER — PHENYLEPHRINE 80 MCG/ML (10ML) SYRINGE FOR IV PUSH (FOR BLOOD PRESSURE SUPPORT)
PREFILLED_SYRINGE | INTRAVENOUS | Status: AC
  Filled 2024-06-29: qty 10

## 2024-06-29 MED ORDER — BUPIVACAINE HCL (PF) 0.5 % IJ SOLN
INTRAMUSCULAR | Status: AC
  Filled 2024-06-29: qty 60

## 2024-06-29 MED ORDER — LACTATED RINGERS IV SOLN: INTRAVENOUS | Status: DC

## 2024-06-29 MED ORDER — SUGAMMADEX SODIUM 200 MG/2ML IV SOLN
INTRAVENOUS | Status: DC | PRN
  Administered 2024-06-29: 200 mg via INTRAVENOUS

## 2024-06-29 MED ORDER — LIDOCAINE 2% (20 MG/ML) 5 ML SYRINGE
INTRAMUSCULAR | Status: AC
Start: 2024-06-29 — End: 2024-06-29
  Filled 2024-06-29: qty 5

## 2024-06-29 MED ORDER — METHYLENE BLUE (ANTIDOTE) 1 % IV SOLN
INTRAVENOUS | Status: AC
  Filled 2024-06-29: qty 20

## 2024-06-29 MED ORDER — CEFAZOLIN SODIUM-DEXTROSE 2-4 GM/100ML-% IV SOLN
INTRAVENOUS | Status: AC
  Filled 2024-06-29: qty 100

## 2024-06-29 MED ORDER — ONDANSETRON HCL 4 MG/2ML IJ SOLN: 4.0000 mg | Freq: Once | INTRAMUSCULAR | Status: DC | PRN

## 2024-06-29 MED ORDER — MIDAZOLAM HCL 2 MG/2ML IJ SOLN
INTRAMUSCULAR | Status: DC | PRN
  Administered 2024-06-29: 2 mg via INTRAVENOUS

## 2024-06-29 MED ORDER — CEFAZOLIN SODIUM-DEXTROSE 2-4 GM/100ML-% IV SOLN
2.0000 g | Freq: Once | INTRAVENOUS | Status: AC
  Administered 2024-06-29: 2 g via INTRAVENOUS

## 2024-06-29 MED ORDER — OXYCODONE HCL 5 MG PO TABS
ORAL_TABLET | ORAL | Status: AC
  Filled 2024-06-29: qty 1

## 2024-06-29 MED ORDER — ENOXAPARIN SODIUM 40 MG/0.4ML IJ SOSY
40.0000 mg | PREFILLED_SYRINGE | INTRAMUSCULAR | Status: AC
  Administered 2024-06-29: 40 mg via SUBCUTANEOUS

## 2024-06-29 MED ORDER — ROCURONIUM BROMIDE 10 MG/ML (PF) SYRINGE
PREFILLED_SYRINGE | INTRAVENOUS | Status: DC | PRN
  Administered 2024-06-29: 60 mg via INTRAVENOUS
  Administered 2024-06-29 (×2): 20 mg via INTRAVENOUS

## 2024-06-29 MED ORDER — METRONIDAZOLE 500 MG/100ML IV SOLN
500.0000 mg | Freq: Once | INTRAVENOUS | Status: AC
  Administered 2024-06-29: 500 mg via INTRAVENOUS

## 2024-06-29 MED ORDER — HYDROMORPHONE HCL 1 MG/ML IJ SOLN
INTRAMUSCULAR | Status: AC
  Filled 2024-06-29: qty 0.5

## 2024-06-29 MED ORDER — PHENYLEPHRINE 80 MCG/ML (10ML) SYRINGE FOR IV PUSH (FOR BLOOD PRESSURE SUPPORT)
PREFILLED_SYRINGE | INTRAVENOUS | Status: DC | PRN
  Administered 2024-06-29: 80 ug via INTRAVENOUS

## 2024-06-29 MED ORDER — ROCURONIUM BROMIDE 10 MG/ML (PF) SYRINGE
PREFILLED_SYRINGE | INTRAVENOUS | Status: AC
Start: 2024-06-29 — End: 2024-06-29
  Filled 2024-06-29: qty 10

## 2024-06-29 MED ORDER — ACETAMINOPHEN 500 MG PO TABS
ORAL_TABLET | ORAL | Status: AC
  Filled 2024-06-29: qty 2

## 2024-06-29 MED ORDER — ORAL CARE MOUTH RINSE: 15.0000 mL | Freq: Once | OROMUCOSAL | Status: AC

## 2024-06-29 MED ORDER — PROPOFOL 10 MG/ML IV BOLUS
INTRAVENOUS | Status: AC
  Filled 2024-06-29: qty 20

## 2024-06-29 MED ORDER — CELECOXIB 200 MG PO CAPS
400.0000 mg | ORAL_CAPSULE | ORAL | Status: AC
  Administered 2024-06-29: 400 mg via ORAL

## 2024-06-29 MED ORDER — BUPIVACAINE HCL (PF) 0.5 % IJ SOLN
INTRAMUSCULAR | Status: DC | PRN
  Administered 2024-06-29: 16 mL

## 2024-06-29 MED ORDER — MIDAZOLAM HCL 2 MG/2ML IJ SOLN
INTRAMUSCULAR | Status: AC
Start: 2024-06-29 — End: 2024-06-29
  Filled 2024-06-29: qty 2

## 2024-06-29 MED ORDER — ROCURONIUM BROMIDE 10 MG/ML (PF) SYRINGE
PREFILLED_SYRINGE | INTRAVENOUS | Status: AC
  Filled 2024-06-29: qty 10

## 2024-06-29 SURGICAL SUPPLY — 47 items
APPLICATOR ARISTA FLEXITIP XL (MISCELLANEOUS) IMPLANT
CATH FOLEY LF 3WAY 5CC16FR (CATHETERS) IMPLANT
CHLORAPREP W/TINT 26 (MISCELLANEOUS) ×2 IMPLANT
COVER BACK TABLE 60X90IN (DRAPES) ×2 IMPLANT
COVER MAYO STAND STRL (DRAPES) ×2 IMPLANT
COVER TIP SHEARS 8 DVNC (MISCELLANEOUS) ×2 IMPLANT
DEFOGGER SCOPE WARM SEASHARP (MISCELLANEOUS) ×2 IMPLANT
DERMABOND ADVANCED .7 DNX12 (GAUZE/BANDAGES/DRESSINGS) ×2 IMPLANT
DRAPE ARM DVNC X/XI (DISPOSABLE) ×8 IMPLANT
DRAPE COLUMN DVNC XI (DISPOSABLE) ×2 IMPLANT
DRAPE SURG IRRIG POUCH 19X23 (DRAPES) ×2 IMPLANT
DRAPE UTILITY XL STRL (DRAPES) ×2 IMPLANT
DRIVER NDL MEGA SUTCUT DVNCXI (INSTRUMENTS) IMPLANT
DRIVER NDLE MEGA SUTCUT DVNCXI (INSTRUMENTS) ×2 IMPLANT
DURAPREP 26ML APPLICATOR (WOUND CARE) IMPLANT
ELECTRODE REM PT RTRN 9FT ADLT (ELECTROSURGICAL) ×2 IMPLANT
FORCEPS BPLR 8 MD DVNC XI (FORCEP) IMPLANT
GLOVE BIOGEL PI IND STRL 7.0 (GLOVE) ×6 IMPLANT
GOWN STRL REUS W/ TWL XL LVL3 (GOWN DISPOSABLE) ×6 IMPLANT
HEMOSTAT ARISTA ABSORB 3G PWDR (HEMOSTASIS) IMPLANT
HIBICLENS CHG 4% 4OZ BTL (MISCELLANEOUS) ×4 IMPLANT
HOLDER FOLEY CATH W/STRAP (MISCELLANEOUS) IMPLANT
IRRIGATION SUCT STRKRFLW 2 WTP (MISCELLANEOUS) ×2 IMPLANT
IV NS 1000ML BAXH (IV SOLUTION) IMPLANT
KIT PINK PAD W/HEAD ARM REST (MISCELLANEOUS) ×2 IMPLANT
KIT TURNOVER KIT B (KITS) ×2 IMPLANT
LEGGING LITHOTOMY PAIR STRL (DRAPES) ×2 IMPLANT
MANIFOLD NEPTUNE II (INSTRUMENTS) ×2 IMPLANT
NDL INSUFFLATION 14GA 120MM (NEEDLE) ×2 IMPLANT
NEEDLE INSUFFLATION 14GA 120MM (NEEDLE) ×2 IMPLANT
NS IRRIG 1000ML POUR BTL (IV SOLUTION) ×2 IMPLANT
OBTURATOR OPTICALSTD 8 DVNC (TROCAR) ×2 IMPLANT
PACK ROBOT WH (CUSTOM PROCEDURE TRAY) ×2 IMPLANT
PAD OB MATERNITY 11 LF (PERSONAL CARE ITEMS) ×2 IMPLANT
RUMI II 3.0CM BLUE KOH-EFFICIE (DISPOSABLE) IMPLANT
RUMI II GYRUS 2.5CM BLUE (DISPOSABLE) IMPLANT
SCISSORS MNPLR CVD DVNC XI (INSTRUMENTS) IMPLANT
SEAL UNIV 5-12 XI (MISCELLANEOUS) ×6 IMPLANT
SEALER VESSEL EXT DVNC XI (MISCELLANEOUS) IMPLANT
SET CYSTO W/LG BORE CLAMP LF (SET/KITS/TRAYS/PACK) IMPLANT
SET TUBE SMOKE EVAC HIGH FLOW (TUBING) ×2 IMPLANT
SPIKE FLUID TRANSFER (MISCELLANEOUS) ×2 IMPLANT
SUT MNCRL AB 3-0 PS2 18 (SUTURE) ×2 IMPLANT
SUT VLOC 180 0 9IN GS21 (SUTURE) ×4 IMPLANT
TIP UTERINE 6.7X10CM GRN DISP (MISCELLANEOUS) IMPLANT
TOWEL GREEN STERILE (TOWEL DISPOSABLE) ×2 IMPLANT
UNDERPAD 30X36 HEAVY ABSORB (UNDERPADS AND DIAPERS) ×2 IMPLANT

## 2024-06-29 NOTE — Transfer of Care (Signed)
 Immediate Anesthesia Transfer of Care Note  Patient: Kathleen Burke  Procedure(s) Performed: HYSTERECTOMY, TOTAL, LAPAROSCOPIC, ROBOT-ASSISTED WITH SALPINGECTOMY (Bilateral) CYSTOSCOPY REMOVAL, INTRAUTERINE DEVICE  Patient Location: PACU  Anesthesia Type:General  Level of Consciousness: drowsy, patient cooperative, lethargic, and responds to stimulation  Airway & Oxygen Therapy: Patient Spontanous Breathing and Patient connected to face mask oxygen  Post-op Assessment: Report given to RN and Post -op Vital signs reviewed and stable  Post vital signs: Reviewed and stable  Last Vitals:  Vitals Value Taken Time  BP 142/83 06/29/24 11:08  Temp    Pulse 103 06/29/24 11:09  Resp 29 06/29/24 11:11  SpO2 91 % 06/29/24 11:09  Vitals shown include unfiled device data.  Last Pain:  Vitals:   06/29/24 0704  TempSrc: Oral  PainSc: 0-No pain      Patients Stated Pain Goal: 5 (06/29/24 0704)  Complications: No notable events documented.

## 2024-06-29 NOTE — Op Note (Signed)
 06/29/24  [id number] Kathleen Burke   OPERATIVE REPORT   Preop Diagnosis: menorrhagia with irregular cycle, fibroids, endometrial polyp Postop Diagnosis: same  Procedure: Procedure(s): HYSTERECTOMY, TOTAL, LAPAROSCOPIC, ROBOT-ASSISTED WITH SALPINGECTOMY CYSTOSCOPY REMOVAL, INTRAUTERINE DEVICE  Surgeon: Vera LULLA Pa, MD Almarie Carpen, MD  Assistant: Circulator: Clarisse, Shann CLEMENS Waddell Silvano CHRISTELLA, RN Relief Scrub: Neysa Joesph BROCKS Scrub Person: Janell, Marina  CINDI Ly, Maggie, CST RN First Assistant: Jackquline Judyann CROME, RN   Fluids: please see anesthesia report   Complications: None Anesthesia: General    Findings:  Normal 11cm uterus, normal ovaries and tubes Cystoscopy at the end of the case with normal bladder and patent ureters bilaterally.  Bilateral jets noted.   Estimated blood loss: Minimal <25cc   Specimens:  ID Type Source Tests Collected by Time Destination  1 : Uterus, cervix, bilateral fallopian tubes Tissue PATH Gyn benign resection SURGICAL PATHOLOGY Pa Vera LULLA, MD 06/29/2024 0940      Disposition of specimen: Pathology   Patient is taken to the operating room. She is placed in the supine position. She is a running IV in place. Informed consent was present on the chart. SCDs on her lower extremities and functioning properly. General endotracheal anesthesia was administered by the anesthesia staff without difficulty. Her legs were placed in the low lithotomy position in Superior stirrups. Her arms were tucked by the side.     Chloraprep was then used to prep the abdomen and Hibiclens  was used to prep the inner thighs, perineum and vagina. Once 3 minutes had past the patient was draped in a normal standard fashion. A proper time out was performed and everyone agreed.  The legs were lifted to the high lithotomy position. A bivalve speculum was inserted into the vagina and the anterior lip of the cervix was grasped with single-tooth tenaculum. The Mirena  IUD  was removed intact. The uterus sounded to 11 cm. Pratt dilators were used to dilate the cervix to 37fr.  The RUMI uterine manipulator was obtained inserted into the endometrial cavity and the bulb of the disposable tip was inflated with 10 cc of normal saline. There was a good fit of the KOH ring around the cervix. The tenaculum and bivavle speculum was removed. There is also good manipulation of the uterus.  A Foley catheter was placed to straight drain.  Clear urine was noted. Legs were lowered to the low lithotomy position and attention was turned the abdomen.   Superior to the umbilicus, 0.5% marcaine  used to anesthetize the skin.  Using #11 blade, 8mm skin incision was made.  The 8mm robotic trocar and sleeve was inserted under direct visualization.  CO2 gas was started and patient was placed in trendelenburg position.  Two additional 8mm ports were placed under direct visualization in the left and right lower quadrant.  A fourth port was placed in the left mid-abdomen.    Ureters were identified.  Attention was turned to the right side.  The right fallopian tube was cauterized and transected along the mesosalpinx.  Attention was turned to the right round ligament.  Right round ligament was serially clamped cauterized and incised.  The utero-ovarian ligament was then cauterized and ligated. The anterior and posterior peritoneum of the inferior leaf of the broad ligament were opened. The beginning of the bladder flap was created.  The bladder was taken down below the level of the KOH ring. The right uterine artery skeletonized and then just superior to the KOH ring this vessel was serially clamped, cauterized,  and incised.   The same procedure was completed on the left to transect the left fallopian tube, left utero-ovarian ligaments. The anterior and posterior peritoneum of the inferior leaf of the broad ligament were opened. The remainder of the bladder flap was created using sharp dissection. The  bladder was well below the level of the KOH ring. The left uterine artery skeletonized. Then the left uterine artery, above the level of the KOH ring, was serially clamped cauterized and incised. The uterus was devascularized at this point.   The colpotomy was performed.  This was carried around a circumferential fashion until the vaginal mucosa was completely incised in the specimen was freed.  The specimen was then delivered to the vagina.  A vaginal occlusive device was used to maintain the pneumoperitoneum   Instruments were changed with a needle driver and prograsp.  Using a 9 inch 0 V-lock suture, the cuff was closed by incorporating the anterior and posterior vaginal mucosa in each stitch. This was carried across all the way to the left corner and a running fashion. Two stitches were brought back towards the midline and the suture was cut flush with the vagina. The needle was brought out the pelvis. The second running stitch incorporating the posterior peritoneum was completed using 0 V-lock. Suture needle was removed from the pelvis. The pelvis was irrigated. All pedicles were inspected. No bleeding was noted.  At this point the procedure was completed.  The remaining instruments were removed.  The ports were removed under direct visualization of the laparoscope and the pneumoperitoneum was relieved.   The Foley catheter was removed.  Cystoscopy was performed.  No sutures or bladder injuries were noted.  Ureters were noted with normal urine jets from each one was seen.  Cystoscope was removed. Attention was then turned the vagina and the cuff was intact on digit exam.   The skin was then closed with subcuticular stitches of 3-0 Vicryl. The skin was cleansed. Dermabond was applied.  No bleeding was noted.  Sponge, lap, needle, instrument counts were correct x2. Patient tolerated the procedure very well. She was awakened from anesthesia, extubated and taken to recovery in stable condition.    Vera LULLA Pa, MD

## 2024-06-29 NOTE — Anesthesia Procedure Notes (Addendum)
 Procedure Name: Intubation Date/Time: 06/29/2024 8:34 AM  Performed by: Corinne Garnette BRAVO, MDPre-anesthesia Checklist: Patient identified, Emergency Drugs available, Suction available and Patient being monitored Patient Re-evaluated:Patient Re-evaluated prior to induction Oxygen Delivery Method: Circle System Utilized Preoxygenation: Pre-oxygenation with 100% oxygen Induction Type: IV induction Ventilation: Mask ventilation without difficulty Laryngoscope Size: 4 and Mac Grade View: Grade I Tube type: Oral Tube size: 7.0 mm Number of attempts: 2 (Heidi looked 2x no view with 3 Miller) Airway Equipment and Method: Stylet Placement Confirmation: ETT inserted through vocal cords under direct vision, positive ETCO2 and breath sounds checked- equal and bilateral Secured at: 23 cm Tube secured with: Tape Dental Injury: Teeth and Oropharynx as per pre-operative assessment  Comments: Attempt x2 by CRNA with Cleotilde 3, unsuccessful.  Attempt x1 by MDA with MAC 4 with grade 1 view.

## 2024-06-29 NOTE — Anesthesia Postprocedure Evaluation (Signed)
 Anesthesia Post Note  Patient: Kathleen Burke  Procedure(s) Performed: HYSTERECTOMY, TOTAL, LAPAROSCOPIC, ROBOT-ASSISTED WITH SALPINGECTOMY (Bilateral) CYSTOSCOPY REMOVAL, INTRAUTERINE DEVICE     Patient location during evaluation: PACU Anesthesia Type: General Level of consciousness: awake and alert Pain management: pain level controlled Vital Signs Assessment: post-procedure vital signs reviewed and stable Respiratory status: spontaneous breathing, nonlabored ventilation and respiratory function stable Cardiovascular status: blood pressure returned to baseline and stable Postop Assessment: no apparent nausea or vomiting Anesthetic complications: no   No notable events documented.  Last Vitals:  Vitals:   06/29/24 1115 06/29/24 1130  BP: (!) 142/86 118/77  Pulse: (!) 105 95  Resp: (!) 29 (!) 21  Temp: 36.6 C   SpO2: 91% 94%    Last Pain:  Vitals:   06/29/24 1215  TempSrc:   PainSc: 5                  [name]

## 2024-06-29 NOTE — Discharge Instructions (Signed)

## 2024-06-29 NOTE — Interval H&P Note (Signed)
 History and Physical Interval Note:  06/29/2024 8:06 AM  Kathleen Burke  has presented today for surgery, with the diagnosis of menorrhagia with irregular cycle, fibroids, endometrial polyp.  The various methods of treatment have been discussed with the patient and family. After consideration of risks, benefits and other options for treatment, the patient has consented to  Procedure(s): HYSTERECTOMY, TOTAL, LAPAROSCOPIC, ROBOT-ASSISTED WITH SALPINGECTOMY (Bilateral) CYSTOSCOPY (N/A) REMOVAL, INTRAUTERINE DEVICE (N/A) as a surgical intervention.  The patient's history has been reviewed, patient examined, no change in status, stable for surgery.  I have reviewed the patient's chart and labs.  Questions were answered to the patient's satisfaction.     Tanisia Yokley LULLA Pa

## 2024-06-30 ENCOUNTER — Encounter (HOSPITAL_COMMUNITY): Payer: Self-pay | Admitting: Obstetrics and Gynecology

## 2024-07-01 ENCOUNTER — Ambulatory Visit (HOSPITAL_BASED_OUTPATIENT_CLINIC_OR_DEPARTMENT_OTHER): Payer: Self-pay | Admitting: Obstetrics and Gynecology

## 2024-07-01 LAB — SURGICAL PATHOLOGY

## 2024-07-05 ENCOUNTER — Other Ambulatory Visit: Payer: Self-pay

## 2024-07-05 ENCOUNTER — Other Ambulatory Visit: Payer: Self-pay | Admitting: Nurse Practitioner

## 2024-07-05 DIAGNOSIS — I10 Essential (primary) hypertension: Secondary | ICD-10-CM

## 2024-07-05 MED ORDER — METOPROLOL SUCCINATE ER 50 MG PO TB24
50.0000 mg | ORAL_TABLET | Freq: Every day | ORAL | 0 refills | Status: DC
  Filled 2024-07-05: qty 90, 90d supply, fill #0

## 2024-07-07 ENCOUNTER — Other Ambulatory Visit: Payer: Self-pay

## 2024-07-13 ENCOUNTER — Ambulatory Visit (INDEPENDENT_AMBULATORY_CARE_PROVIDER_SITE_OTHER): Admitting: Obstetrics and Gynecology

## 2024-07-13 ENCOUNTER — Encounter: Payer: Self-pay | Admitting: Obstetrics and Gynecology

## 2024-07-13 VITALS — BP 122/78 | HR 73 | Temp 98.4°F | Wt 233.0 lb

## 2024-07-13 DIAGNOSIS — Z09 Encounter for follow-up examination after completed treatment for conditions other than malignant neoplasm: Secondary | ICD-10-CM

## 2024-07-13 NOTE — Patient Instructions (Signed)
 Continue pelvic rest for 8wk or until you are cleared This includes use of tampons, intercourse, douching, public bathing (including hot tubs, pools, the ocean).

## 2024-07-13 NOTE — Progress Notes (Signed)
 48 y.o. G4P2002 female with AUB, fibroids, endometrial polyp, hypertension, OSA, GERD, prediabetes (most recent A1c 5.6), declines  here for postoperative exam: 2wk s/p robotic assisted total laparoscopic hysterectomy, bilateral salpingectomy, cystoscopy and Mirena  IUD removal. [facility] RN first training and wellness labs. Married, vasectomy.  Pt states she is doing well post surgery. Denies N/V, abdominal pain, VB, dysuria. Normal BM and voids.   06/29/24 pathology: A. UTERUS AND CERVIX, WITH BILATERAL FALLOPIAN TUBES, HYSTERECTOMY:   Cervix:             No dysplasia identified.   Endometrium:   Atrophic decidualized endometrium, characteristic for exogenous    progestin effect.       Negative for hyperplasia, intraepithelial neoplasia (EIN) and malignancy.   Myometrium:        Multiple leiomyomas, largest 3.7 cm in greatest  dimension.   Fallopian Tubes:    Bilateral fimbriated fallopian tubes, without  significant epithelial atypia.    GYN HISTORY: Prior hysteroscopy, as noted, 2021  OB History  Gravida Para Term Preterm AB Living  2 2 2   2   SAB IAB Ectopic Multiple Live Births      2    # Outcome Date GA Lbr Len/2nd Weight Sex Type Anes PTL Lv  2 Term 08/25/05   9 lb 6 oz (4.252 kg) F Vag-Spont   LIV  1 Term 05/04/04   7 lb 6 oz (3.345 kg) M Vag-Spont   LIV    Past Medical History:  Diagnosis Date   Anxiety 2014   Arthritis    right arm, right knee   Depression    Dry eye syndrome of both eyes    Endometrial polyp    Frequent headaches    History of palpitations 03/2021   cardiology evalution by dr b. jeanett etang (lov in epic 05-17-2021;  normal echo 05-15-2021;  normal lexiscan  02-02-2020;  Zio 06/ 2022  NSR w/ no sig arrhythmia only rare PAC/ PVC)   Hyperlipidemia    Hypertension    Iron deficiency anemia    Menorrhagia with irregular cycle    OAB (overactive bladder)    OSA on CPAP    ( 06-22-2024  pt stated uses cpap nightly)  followed by [facility]--  almarie ferrari NP;   HST in epic 04-11-2019  AHI 16/hr   TMJ (temporomandibular joint disorder)    06-22-2024   pt stated uses mouth guard at night (grinds teeth)   Type 2 diabetes mellitus (HCC)    followed by pcp  (06-22-2024 pt stated checks blood sugar at home x2 per week fasting,  avearge 89--90)   Uterine fibroid    Wears glasses     Past Surgical History:  Procedure Laterality Date   COLONOSCOPY WITH PROPOFOL  N/A 07/18/2019   Procedure: COLONOSCOPY WITH PROPOFOL ;  Surgeon: Jinny Carmine, MD;  Location: [facility];  Service: Endoscopy;  Laterality: N/A;  Latex Sleep apnea   CYSTOSCOPY N/A 06/29/2024   Procedure: CYSTOSCOPY;  Surgeon: Dallie Vera GAILS, MD;  Location: [facility];  Service: Gynecology;  Laterality: N/A;   HYSTERECTOMY, TOTAL, LAPAROSCOPIC, ROBOT-ASSISTED WITH SALPINGECTOMY Bilateral 06/29/2024   Procedure: HYSTERECTOMY, TOTAL, LAPAROSCOPIC, ROBOT-ASSISTED WITH SALPINGECTOMY;  Surgeon: Dallie Vera GAILS, MD;  Location: [facility];  Service: Gynecology;  Laterality: Bilateral;   HYSTEROSCOPY WITH D & C N/A 05/20/2018   Procedure: DILATATION AND CURETTAGE /HYSTEROSCOPY;  Surgeon: Lake Read, MD;  Location: [facility];  Service: Gynecology;  Laterality: N/A;   HYSTEROSCOPY WITH D & C  N/A 10/25/2020   Procedure: DILATATION AND CURETTAGE /HYSTEROSCOPY;  Surgeon: Lake Read, MD;  Location: [facility];  Service: Gynecology;  Laterality: N/A;   INTRAUTERINE DEVICE (IUD) INSERTION N/A 05/20/2018   Procedure: INTRAUTERINE DEVICE (IUD) INSERTION;  Surgeon: Lake Read, MD;  Location: [facility];  Service: Gynecology;  Laterality: N/A;   INTRAUTERINE DEVICE (IUD) INSERTION  10/25/2020   Procedure: INTRAUTERINE DEVICE (IUD) INSERTION;  Surgeon: Lake Read, MD;  Location: [facility];  Service: Gynecology;;   IUD REMOVAL N/A 06/29/2024   Procedure: REMOVAL, INTRAUTERINE DEVICE;  Surgeon: Dallie Vera GAILS, MD;  Location: [facility];  Service: Gynecology;  Laterality: N/A;   TRIGGER FINGER  RELEASE Right 05/26/2018   by dr christella. kathlynn ;  local;  right long finger   WISDOM TOOTH EXTRACTION      Current Outpatient Medications on File Prior to Visit  Medication Sig Dispense Refill   acetaminophen  (TYLENOL ) 500 MG tablet Take 2 tablets (1,000 mg total) by mouth every 8 (eight) hours as needed. 90 tablet 0   ALPRAZolam  (XANAX ) 0.5 MG tablet Take 1 tablet (0.5 mg total) by mouth daily as needed for anxiety. 30 tablet 0   Ascorbic Acid (VITAMIN C) 1000 MG tablet Take 1,000 mg by mouth daily with lunch. capsule     b complex vitamins capsule Take 1 capsule by mouth daily with lunch.     Cholecalciferol (VITAMIN D3) 125 MCG (5000 UT) CAPS Take 5,000 Units by mouth daily with lunch.     Cyanocobalamin (B-12) 1000 MCG TABS Take 1,000 mcg by mouth daily with lunch. capsule     docusate sodium (COLACE) 100 MG capsule Take 100 mg by mouth daily with lunch.     Elderberry-Vitamin C-Zinc (ELDERBERRY IMMUNE HEALTH GUMMY PO) Take 2 each by mouth daily with lunch.     escitalopram  (LEXAPRO ) 20 MG tablet Take 1 tablet (20 mg total) by mouth daily. 90 tablet 3   ferrous sulfate 325 (65 FE) MG tablet Take 325 mg by mouth daily with lunch.     ibuprofen  (ADVIL ) 800 MG tablet Take 1 tablet (800 mg total) by mouth every 8 (eight) hours as needed for up to 42 doses. 42 tablet 0   levocetirizine (XYZAL) 5 MG tablet Take 5 mg by mouth at bedtime.     Magnesium 400 MG CAPS Take 2 capsules by mouth at bedtime.     metoprolol  succinate (TOPROL  XL) 50 MG 24 hr tablet Take 1 tablet (50 mg total) by mouth daily. Take with or immediately following a meal. Stop HCTZ and Toprol  25 90 tablet 0   oxybutynin  (DITROPAN  XL) 5 MG 24 hr tablet Take 1 tablet (5 mg total) by mouth at bedtime. 90 tablet 3   pravastatin  (PRAVACHOL ) 40 MG tablet Take 1 tablet (40 mg total) by mouth daily. 90 tablet 3   Semaglutide , 2 MG/DOSE, 8 MG/3ML SOPN Inject 2 mg into the skin once a week. 9 mL 3   Turmeric 500 MG CAPS Take 2 capsules by  mouth daily with lunch.     Zinc 50 MG TABS Take 1 tablet by mouth daily with lunch. capsule     oxyCODONE  (ROXICODONE ) 5 MG immediate release tablet On day 1, take 1 tablet by mouth every 4 hours as needed. On day 2, take 1 tablet by mouth every 6 hours as needed. On day 3, take 1 tablet by mouth every 8 hours as needed. On day 4, take 1 tablet by mouth every 12 hours as  needed. (Patient not taking: Reported on 07/13/2024) 15 tablet 0   No current facility-administered medications on file prior to visit.    Allergies  Allergen Reactions   Latex Itching    GLOVES (extended exposure)      PE Today's Vitals   07/13/24 1412  BP: 122/78  Pulse: 73  Temp: 98.4 F (36.9 C)  TempSrc: Oral  SpO2: 98%  Weight: 233 lb (105.7 kg)   Body mass index is 38.18 kg/m.  Physical Exam Vitals reviewed.  Constitutional:      General: She is not in acute distress.    Appearance: Normal appearance.  HENT:     Head: Normocephalic and atraumatic.     Nose: Nose normal.  Eyes:     Extraocular Movements: Extraocular movements intact.     Conjunctiva/sclera: Conjunctivae normal.  Pulmonary:     Effort: Pulmonary effort is normal.  Abdominal:     General: There is no distension.     Palpations: Abdomen is soft.     Tenderness: There is no abdominal tenderness.     Comments: 4 port site incisions well healed.  Musculoskeletal:        General: Normal range of motion.     Cervical back: Normal range of motion.  Neurological:     General: No focal deficit present.     Mental Status: She is alert.  Psychiatric:        Mood and Affect: Mood normal.        Behavior: Behavior normal.       Assessment and Plan:       Postop check  Normal postop exam.  Pathology reviewed with patient. Cleared to drive Continue pelvic rest for 8wk or until clearance Next postop in 4wk, RTW pending recovery All questions answered.    Vera LULLA Pa, MD

## 2024-07-14 DIAGNOSIS — G4733 Obstructive sleep apnea (adult) (pediatric): Secondary | ICD-10-CM | POA: Diagnosis not present

## 2024-07-15 ENCOUNTER — Other Ambulatory Visit: Payer: Self-pay

## 2024-08-05 ENCOUNTER — Other Ambulatory Visit: Payer: Self-pay

## 2024-08-05 MED ORDER — FLUZONE 0.5 ML IM SUSY
0.5000 mL | PREFILLED_SYRINGE | Freq: Once | INTRAMUSCULAR | 0 refills | Status: AC
Start: 2024-08-05 — End: 2024-08-06
  Filled 2024-08-05: qty 0.5, 1d supply, fill #0

## 2024-08-06 ENCOUNTER — Other Ambulatory Visit: Payer: Self-pay | Admitting: Nurse Practitioner

## 2024-08-06 DIAGNOSIS — R7303 Prediabetes: Secondary | ICD-10-CM

## 2024-08-07 ENCOUNTER — Other Ambulatory Visit: Payer: Self-pay | Admitting: Nurse Practitioner

## 2024-08-07 ENCOUNTER — Other Ambulatory Visit: Payer: Self-pay

## 2024-08-07 DIAGNOSIS — R7303 Prediabetes: Secondary | ICD-10-CM

## 2024-08-08 ENCOUNTER — Other Ambulatory Visit: Payer: Self-pay

## 2024-08-08 MED FILL — Semaglutide Soln Pen-inj 2 MG/DOSE (8 MG/3ML): SUBCUTANEOUS | 84 days supply | Qty: 9 | Fill #0 | Status: CN

## 2024-08-08 MED FILL — Semaglutide Soln Pen-inj 2 MG/DOSE (8 MG/3ML): SUBCUTANEOUS | 28 days supply | Qty: 3 | Fill #0 | Status: CN

## 2024-08-09 ENCOUNTER — Telehealth: Payer: Self-pay

## 2024-08-09 NOTE — Telephone Encounter (Signed)
 Form was received stating pt needs a Dx of type 2 diabetes in order to receive ozempic 

## 2024-08-10 ENCOUNTER — Ambulatory Visit (INDEPENDENT_AMBULATORY_CARE_PROVIDER_SITE_OTHER): Admitting: Obstetrics and Gynecology

## 2024-08-10 ENCOUNTER — Encounter: Payer: Self-pay | Admitting: Obstetrics and Gynecology

## 2024-08-10 ENCOUNTER — Other Ambulatory Visit: Payer: Self-pay

## 2024-08-10 VITALS — BP 100/62 | HR 73 | Temp 98.1°F | Wt 236.0 lb

## 2024-08-10 DIAGNOSIS — Z09 Encounter for follow-up examination after completed treatment for conditions other than malignant neoplasm: Secondary | ICD-10-CM

## 2024-08-10 MED FILL — Semaglutide Soln Pen-inj 2 MG/DOSE (8 MG/3ML): SUBCUTANEOUS | 28 days supply | Qty: 3 | Fill #0 | Status: CN

## 2024-08-10 NOTE — Progress Notes (Signed)
 48 y.o. G19P2002 female with AUB, fibroids, endometrial polyp, hypertension, OSA, GERD, prediabetes (most recent A1c 5.6), here for postoperative exam: 6wk s/p robotic assisted total laparoscopic hysterectomy, bilateral salpingectomy, cystoscopy and Mirena  IUD removal. [facility] RN first training and wellness labs. Married, vasectomy.  Pt states she is doing well post surgery. Denies N/V, abdominal pain, VB, dysuria. Normal BM and voids.   06/29/24 pathology: A. UTERUS AND CERVIX, WITH BILATERAL FALLOPIAN TUBES, HYSTERECTOMY:   Cervix:             No dysplasia identified.   Endometrium:   Atrophic decidualized endometrium, characteristic for exogenous    progestin effect.       Negative for hyperplasia, intraepithelial neoplasia (EIN) and malignancy.   Myometrium:        Multiple leiomyomas, largest 3.7 cm in greatest  dimension.   Fallopian Tubes:    Bilateral fimbriated fallopian tubes, without  significant epithelial atypia.    GYN HISTORY: Prior hysteroscopy, as noted, 2021  OB History  Gravida Para Term Preterm AB Living  2 2 2   2   SAB IAB Ectopic Multiple Live Births      2    # Outcome Date GA Lbr Len/2nd Weight Sex Type Anes PTL Lv  2 Term 08/25/05   9 lb 6 oz (4.252 kg) F Vag-Spont   LIV  1 Term 05/04/04   7 lb 6 oz (3.345 kg) M Vag-Spont   LIV    Past Medical History:  Diagnosis Date   Anxiety 2014   Arthritis    right arm, right knee   Depression    Dry eye syndrome of both eyes    Endometrial polyp    Frequent headaches    History of palpitations 03/2021   cardiology evalution by dr b. jeanett etang (lov in epic 05-17-2021;  normal echo 05-15-2021;  normal lexiscan  02-02-2020;  Zio 06/ 2022  NSR w/ no sig arrhythmia only rare PAC/ PVC)   Hyperlipidemia    Hypertension    Iron deficiency anemia    Menorrhagia with irregular cycle    OAB (overactive bladder)    OSA on CPAP    ( 06-22-2024  pt stated uses cpap nightly)  followed by [facility]--  almarie ferrari NP;    HST in epic 04-11-2019  AHI 16/hr   TMJ (temporomandibular joint disorder)    06-22-2024   pt stated uses mouth guard at night (grinds teeth)   Type 2 diabetes mellitus (HCC)    followed by pcp  (06-22-2024 pt stated checks blood sugar at home x2 per week fasting,  avearge 89--90)   Uterine fibroid    Wears glasses     Past Surgical History:  Procedure Laterality Date   COLONOSCOPY WITH PROPOFOL  N/A 07/18/2019   Procedure: COLONOSCOPY WITH PROPOFOL ;  Surgeon: Jinny Carmine, MD;  Location: [facility];  Service: Endoscopy;  Laterality: N/A;  Latex Sleep apnea   CYSTOSCOPY N/A 06/29/2024   Procedure: CYSTOSCOPY;  Surgeon: Dallie Vera GAILS, MD;  Location: [facility];  Service: Gynecology;  Laterality: N/A;   HYSTERECTOMY, TOTAL, LAPAROSCOPIC, ROBOT-ASSISTED WITH SALPINGECTOMY Bilateral 06/29/2024   Procedure: HYSTERECTOMY, TOTAL, LAPAROSCOPIC, ROBOT-ASSISTED WITH SALPINGECTOMY;  Surgeon: Dallie Vera GAILS, MD;  Location: [facility];  Service: Gynecology;  Laterality: Bilateral;   HYSTEROSCOPY WITH D & C N/A 05/20/2018   Procedure: DILATATION AND CURETTAGE /HYSTEROSCOPY;  Surgeon: Lake Read, MD;  Location: [facility];  Service: Gynecology;  Laterality: N/A;   HYSTEROSCOPY WITH D & C N/A 10/25/2020  Procedure: DILATATION AND CURETTAGE /HYSTEROSCOPY;  Surgeon: Lake Read, MD;  Location: [facility];  Service: Gynecology;  Laterality: N/A;   INTRAUTERINE DEVICE (IUD) INSERTION N/A 05/20/2018   Procedure: INTRAUTERINE DEVICE (IUD) INSERTION;  Surgeon: Lake Read, MD;  Location: [facility];  Service: Gynecology;  Laterality: N/A;   INTRAUTERINE DEVICE (IUD) INSERTION  10/25/2020   Procedure: INTRAUTERINE DEVICE (IUD) INSERTION;  Surgeon: Lake Read, MD;  Location: [facility];  Service: Gynecology;;   IUD REMOVAL N/A 06/29/2024   Procedure: REMOVAL, INTRAUTERINE DEVICE;  Surgeon: Dallie Vera GAILS, MD;  Location: [facility];  Service: Gynecology;  Laterality: N/A;   TRIGGER FINGER RELEASE  Right 05/26/2018   by dr christella. kathlynn ;  local;  right long finger   WISDOM TOOTH EXTRACTION      Current Outpatient Medications on File Prior to Visit  Medication Sig Dispense Refill   acetaminophen  (TYLENOL ) 500 MG tablet Take 2 tablets (1,000 mg total) by mouth every 8 (eight) hours as needed. 90 tablet 0   ALPRAZolam  (XANAX ) 0.5 MG tablet Take 1 tablet (0.5 mg total) by mouth daily as needed for anxiety. 30 tablet 0   Ascorbic Acid (VITAMIN C) 1000 MG tablet Take 1,000 mg by mouth daily with lunch. capsule     b complex vitamins capsule Take 1 capsule by mouth daily with lunch.     Cholecalciferol (VITAMIN D3) 125 MCG (5000 UT) CAPS Take 5,000 Units by mouth daily with lunch.     Cyanocobalamin (B-12) 1000 MCG TABS Take 1,000 mcg by mouth daily with lunch. capsule     docusate sodium (COLACE) 100 MG capsule Take 100 mg by mouth daily with lunch.     Elderberry-Vitamin C-Zinc (ELDERBERRY IMMUNE HEALTH GUMMY PO) Take 2 each by mouth daily with lunch.     escitalopram  (LEXAPRO ) 20 MG tablet Take 1 tablet (20 mg total) by mouth daily. 90 tablet 3   ferrous sulfate 325 (65 FE) MG tablet Take 325 mg by mouth daily with lunch.     ibuprofen  (ADVIL ) 800 MG tablet Take 1 tablet (800 mg total) by mouth every 8 (eight) hours as needed for up to 42 doses. 42 tablet 0   levocetirizine (XYZAL) 5 MG tablet Take 5 mg by mouth at bedtime.     Magnesium 400 MG CAPS Take 2 capsules by mouth at bedtime.     metoprolol  succinate (TOPROL  XL) 50 MG 24 hr tablet Take 1 tablet (50 mg total) by mouth daily. Take with or immediately following a meal. Stop HCTZ and Toprol  25 90 tablet 0   oxybutynin  (DITROPAN  XL) 5 MG 24 hr tablet Take 1 tablet (5 mg total) by mouth at bedtime. 90 tablet 3   pravastatin  (PRAVACHOL ) 40 MG tablet Take 1 tablet (40 mg total) by mouth daily. 90 tablet 3   Semaglutide , 2 MG/DOSE, (OZEMPIC , 2 MG/DOSE,) 8 MG/3ML SOPN Inject 2 mg into the skin once a week. 9 mL 3   Turmeric 500 MG CAPS Take 2  capsules by mouth daily with lunch.     Zinc 50 MG TABS Take 1 tablet by mouth daily with lunch. capsule     No current facility-administered medications on file prior to visit.    Allergies  Allergen Reactions   Latex Itching    GLOVES (extended exposure)      PE Today's Vitals   08/10/24 1413  BP: 100/62  Pulse: 73  Temp: 98.1 F (36.7 C)  TempSrc: Oral  SpO2: 98%  Weight: 236  lb (107 kg)   Body mass index is 38.68 kg/m.  Physical Exam Vitals reviewed. Exam conducted with a chaperone present.  Constitutional:      General: She is not in acute distress.    Appearance: Normal appearance.  HENT:     Head: Normocephalic and atraumatic.     Nose: Nose normal.  Eyes:     Extraocular Movements: Extraocular movements intact.     Conjunctiva/sclera: Conjunctivae normal.  Pulmonary:     Effort: Pulmonary effort is normal.  Abdominal:     General: There is no distension.     Palpations: Abdomen is soft.     Tenderness: There is no abdominal tenderness.     Comments: 4 port site incisions well healed.  Genitourinary:    General: Normal vulva.     Exam position: Lithotomy position.     Vagina: Normal.     Adnexa: Right adnexa normal and left adnexa normal.     Comments: Vaginal cuff intact, still healing Musculoskeletal:        General: Normal range of motion.     Cervical back: Normal range of motion.  Lymphadenopathy:     Lower Body: No right inguinal adenopathy. No left inguinal adenopathy.  Neurological:     General: No focal deficit present.     Mental Status: She is alert.  Psychiatric:        Mood and Affect: Mood normal.        Behavior: Behavior normal.      Assessment and Plan:       Postop check  Normal postop exam.  Pathology reviewed with patient. Cleared to RTW Continue pelvic rest for 6wk, Next postop in 6wk All questions answered.    Vera LULLA Pa, MD

## 2024-08-11 MED FILL — Semaglutide Soln Pen-inj 2 MG/DOSE (8 MG/3ML): SUBCUTANEOUS | 84 days supply | Qty: 9 | Fill #0 | Status: CN

## 2024-08-12 ENCOUNTER — Other Ambulatory Visit: Payer: Self-pay

## 2024-08-12 ENCOUNTER — Encounter: Payer: Self-pay | Admitting: Nurse Practitioner

## 2024-08-12 MED FILL — Semaglutide Soln Pen-inj 2 MG/DOSE (8 MG/3ML): SUBCUTANEOUS | 84 days supply | Qty: 9 | Fill #0 | Status: CN

## 2024-08-16 ENCOUNTER — Telehealth: Payer: Self-pay

## 2024-08-16 NOTE — Telephone Encounter (Signed)
 Patient called & stated that she saw Dr Dallie for a post op appointment  on 08-10-24. She said she was cleared to return to work but forgot to get the note. Patient asking if it can be sent to her mychart.

## 2024-08-19 ENCOUNTER — Other Ambulatory Visit: Payer: Self-pay | Admitting: Nurse Practitioner

## 2024-08-19 ENCOUNTER — Other Ambulatory Visit: Payer: Self-pay

## 2024-08-19 DIAGNOSIS — I10 Essential (primary) hypertension: Secondary | ICD-10-CM

## 2024-08-19 MED ORDER — METOPROLOL SUCCINATE ER 50 MG PO TB24
50.0000 mg | ORAL_TABLET | Freq: Every day | ORAL | 0 refills | Status: AC
  Filled 2024-08-19 – 2024-10-05 (×2): qty 90, 90d supply, fill #0

## 2024-08-27 ENCOUNTER — Other Ambulatory Visit: Payer: Self-pay

## 2024-08-27 MED FILL — Semaglutide Soln Pen-inj 2 MG/DOSE (8 MG/3ML): SUBCUTANEOUS | 84 days supply | Qty: 9 | Fill #0 | Status: CN

## 2024-08-29 ENCOUNTER — Other Ambulatory Visit: Payer: Self-pay

## 2024-09-14 ENCOUNTER — Other Ambulatory Visit: Payer: Self-pay

## 2024-09-14 DIAGNOSIS — M7661 Achilles tendinitis, right leg: Secondary | ICD-10-CM | POA: Diagnosis not present

## 2024-09-14 MED ORDER — METHYLPREDNISOLONE 4 MG PO TBPK
ORAL_TABLET | ORAL | 0 refills | Status: DC
  Filled 2024-09-14: qty 21, 6d supply, fill #0

## 2024-09-19 ENCOUNTER — Encounter: Payer: Self-pay | Admitting: Nurse Practitioner

## 2024-09-19 ENCOUNTER — Ambulatory Visit: Admitting: Nurse Practitioner

## 2024-09-19 VITALS — BP 134/80 | HR 85 | Temp 97.9°F | Ht 65.5 in | Wt 240.0 lb

## 2024-09-19 DIAGNOSIS — G4733 Obstructive sleep apnea (adult) (pediatric): Secondary | ICD-10-CM

## 2024-09-19 NOTE — Assessment & Plan Note (Signed)
 Moderate OSA on CPAP. Good compliance and control. Receives benefit from use. Encouraged to increase usage above 4 hours to goal of 70% or greater. Otherwise, doing very well. No issues with mask fit or pressures. Aware of proper care/use. Understands risks of untreated OSA. Healthy weight management encouraged. Safe driving practices reviewed.  Patient Instructions  Continue to use CPAP every night, minimum of 4-6 hours a night.  Change equipment as directed. Wash your tubing with warm soap and water  daily, hang to dry. Wash humidifier portion weekly. Use bottled, distilled water  and change daily Be aware of reduced alertness and do not drive or operate heavy machinery if experiencing this or drowsiness.  Exercise encouraged, as tolerated. Healthy weight management discussed.  Avoid or decrease alcohol consumption and medications that make you more sleepy, if possible. Notify if persistent daytime sleepiness occurs even with consistent use of PAP therapy.  Change CPAP supplies... Every month Mask cushions and/or nasal pillows CPAP machine filters Every 3 months Mask frame (not including the headgear) CPAP tubing Every 6 months Mask headgear Chin strap (if applicable) Humidifier water  tub  Increase your use of CPAP as much as possible. Goal is 70% of the nights or greater above 4 hours of use   Follow up in one year with any sleep provider, or sooner, if needed

## 2024-09-19 NOTE — Progress Notes (Signed)
 @Patient  ID: Kathleen Burke, female    DOB: 04-26-1976, 48 y.o.   MRN: [id number]  Chief Complaint  Patient presents with   Obstructive Sleep Apnea    Wearing CPAP every night. No problems with mask or pressure.     Referring provider: Gretel App, NP  HPI: 48 year old female, never smoker followed for OSA on CPAP. She was last seen 03/24/2023 by Hope PIETY. Past medical history significant for HTN, GERD, prediabetes, HLD. 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   TEST/EVENTS:  2020 HST: AHI 16/h >> moderate OSA  03/24/2023: OV with Hope NP. Difficulty tolerating CPAP due to pressures at last OV. Changed to set pressure of 9 cmH2O. Doing well and tolerating better. 100% compliant with average 4 hr 17 min use. Sometimes falls asleep on the cough before putting on CPAP.   09/19/2024: Today - follow up Discussed the use of AI scribe software for clinical note transcription with the patient, who gave verbal consent to proceed.  History of Present Illness Kathleen Burke is a 48 year old female with obstructive sleep apnea who presents for CPAP compliance follow-up.  She has been using a CPAP machine for her obstructive sleep apnea, wearing it every night. Sometimes falls asleep without it and her husband will wake her up and remind her to put it on.   There are no issues with the mask fit after a recent change in the connection part, which has reduced sweating and improved comfort. She feels she is sleeping well with the CPAP and has decent energy levels during the day. No issues with drowsiness while driving. No residual snoring.   She does not take any sleep medications. Uses a full face mask.       Allergies  Allergen Reactions   Latex Itching    GLOVES (extended exposure)    Immunization History  Administered Date(s) Administered   Influenza Split 08/13/2023   Influenza, Seasonal, Injecte, Preservative Fre 08/05/2024   Influenza,inj,Quad PF,6+ Mos 07/18/2020   Influenza-Unspecified  06/26/2015, 06/16/2018, 08/18/2019, 08/06/2021, 06/27/2022   Moderna Covid-19 Vaccine Bivalent Booster 2yrs & up 08/13/2023   Moderna Sars-Covid-2 Vaccination 02/02/2020, 03/01/2020   PFIZER(Purple Top)SARS-COV-2 Vaccination 08/31/2020   Pfizer Covid-19 Vaccine Bivalent Booster 21yrs & up 08/30/2021   Pfizer(Comirnaty)Fall Seasonal Vaccine 12 years and older 08/15/2022   Pneumococcal Polysaccharide-23 11/29/2021   Tdap 11/25/2009, 08/20/2020   Zoster Recombinant(Shingrix ) 05/13/2024    Past Medical History:  Diagnosis Date   Anxiety 2014   Arthritis    right arm, right knee   Depression    Dry eye syndrome of both eyes    Endometrial polyp    Frequent headaches    History of palpitations 03/2021   cardiology evalution by dr b. jeanett etang (lov in epic 05-17-2021;  normal echo 05-15-2021;  normal lexiscan  02-02-2020;  Zio 06/ 2022  NSR w/ no sig arrhythmia only rare PAC/ PVC)   Hyperlipidemia    Hypertension    Iron deficiency anemia    Menorrhagia with irregular cycle    OAB (overactive bladder)    OSA on CPAP    ( 06-22-2024  pt stated uses cpap nightly)  followed by [facility]--  almarie hope NP;   HST in epic 04-11-2019  AHI 16/hr   TMJ (temporomandibular joint disorder)    06-22-2024   pt stated uses mouth guard at night (grinds teeth)   Type 2 diabetes mellitus (HCC)    followed by pcp  (06-22-2024 pt stated checks blood sugar  at home x2 per week fasting,  avearge 89--90)   Uterine fibroid    Wears glasses     Tobacco History: Social History   Tobacco Use  Smoking Status Never   Passive exposure: Never  Smokeless Tobacco Never   Counseling given: Not Answered   Outpatient Medications Prior to Visit  Medication Sig Dispense Refill   acetaminophen  (TYLENOL ) 500 MG tablet Take 2 tablets (1,000 mg total) by mouth every 8 (eight) hours as needed. 90 tablet 0   ALPRAZolam  (XANAX ) 0.5 MG tablet Take 1 tablet (0.5 mg total) by mouth daily as needed for anxiety. 30  tablet 0   Ascorbic Acid (VITAMIN C) 1000 MG tablet Take 1,000 mg by mouth daily with lunch. capsule     b complex vitamins capsule Take 1 capsule by mouth daily with lunch.     Cholecalciferol (VITAMIN D3) 125 MCG (5000 UT) CAPS Take 5,000 Units by mouth daily with lunch.     Cyanocobalamin (B-12) 1000 MCG TABS Take 1,000 mcg by mouth daily with lunch. capsule     docusate sodium (COLACE) 100 MG capsule Take 100 mg by mouth daily with lunch.     Elderberry-Vitamin C-Zinc (ELDERBERRY IMMUNE HEALTH GUMMY PO) Take 2 each by mouth daily with lunch.     escitalopram  (LEXAPRO ) 20 MG tablet Take 1 tablet (20 mg total) by mouth daily. 90 tablet 3   ferrous sulfate 325 (65 FE) MG tablet Take 325 mg by mouth daily with lunch.     ibuprofen  (ADVIL ) 800 MG tablet Take 1 tablet (800 mg total) by mouth every 8 (eight) hours as needed for up to 42 doses. 42 tablet 0   levocetirizine (XYZAL) 5 MG tablet Take 5 mg by mouth at bedtime.     Magnesium 400 MG CAPS Take 2 capsules by mouth at bedtime.     methylPREDNISolone  (MEDROL ) 4 MG TBPK tablet Take as directed. 21 tablet 0   metoprolol  succinate (TOPROL  XL) 50 MG 24 hr tablet Take 1 tablet (50 mg total) by mouth daily. Take with or immediately following a meal. Stop HCTZ and Toprol  25 90 tablet 0   oxybutynin  (DITROPAN  XL) 5 MG 24 hr tablet Take 1 tablet (5 mg total) by mouth at bedtime. 90 tablet 3   pravastatin  (PRAVACHOL ) 40 MG tablet Take 1 tablet (40 mg total) by mouth daily. 90 tablet 3   Semaglutide , 2 MG/DOSE, (OZEMPIC , 2 MG/DOSE,) 8 MG/3ML SOPN Inject 2 mg into the skin once a week. 9 mL 3   Turmeric 500 MG CAPS Take 2 capsules by mouth daily with lunch.     Zinc 50 MG TABS Take 1 tablet by mouth daily with lunch. capsule     No facility-administered medications prior to visit.     Review of Systems: as above     Physical Exam:  BP 134/80   Pulse 85   Temp 97.9 F (36.6 C) (Temporal)   Ht 5' 5.5 (1.664 m)   Wt 240 lb (108.9 kg)   LMP   (LMP Unknown)   SpO2 97%   BMI 39.33 kg/m   GEN: Pleasant, interactive, well-appearing; obese; in no acute distress HEENT:  Normocephalic and atraumatic. PERRLA. Sclera white. Nasal turbinates pink, moist and patent bilaterally. No rhinorrhea present. Oropharynx pink and moist, without exudate or edema. No lesions, ulcerations, or postnasal drip.  NECK:  Supple w/ fair ROM.  CV: RRR, no m/r/g PULMONARY:  Unlabored, regular breathing. Clear bilaterally A&P w/o wheezes/rales/rhonchi. No accessory  muscle use.  GI: BS present and normoactive. Soft, non-tender to palpation. Neuro: A/Ox3. No focal deficits noted.   Skin: Warm, no lesions or rashe Psych: Normal affect and behavior. Judgement and thought content appropriate.     Lab Results:  CBC    Component Value Date/Time   WBC 6.6 06/24/2024 1249   RBC 4.04 06/24/2024 1249   HGB 12.2 06/24/2024 1249   HGB 11.9 03/29/2021 1455   HCT 38.0 06/24/2024 1249   HCT 35.4 03/29/2021 1455   PLT 325 06/24/2024 1249   PLT 374 03/29/2021 1455   MCV 94.1 06/24/2024 1249   MCV 86 03/29/2021 1455   MCH 30.2 06/24/2024 1249   MCHC 32.1 06/24/2024 1249   RDW 12.9 06/24/2024 1249   RDW 13.0 03/29/2021 1455   LYMPHSABS 1.7 04/13/2024 0825   LYMPHSABS 1.4 03/29/2021 1455   MONOABS 0.5 04/13/2024 0825   EOSABS 0.1 04/13/2024 0825   EOSABS 0.1 03/29/2021 1455   BASOSABS 0.0 04/13/2024 0825   BASOSABS 0.0 03/29/2021 1455    BMET    Component Value Date/Time   NA 137 06/24/2024 1249   NA 138 03/29/2021 1455   K 3.9 06/24/2024 1249   CL 99 06/24/2024 1249   CO2 28 06/24/2024 1249   GLUCOSE 93 06/24/2024 1249   BUN 9 06/24/2024 1249   BUN 13 03/29/2021 1455   CREATININE 0.69 06/24/2024 1249   CREATININE 0.64 03/17/2018 1036   CALCIUM 9.5 06/24/2024 1249   GFRNONAA >60 06/24/2024 1249   GFRAA >60 12/16/2019 1158    BNP    Component Value Date/Time   BNP 5.3 08/04/2022 0736     Imaging:  No results found.  Administration  History     None           No data to display          No results found for: NITRICOXIDE      Assessment & Plan:   OSA (obstructive sleep apnea) Moderate OSA on CPAP. Good compliance and control. Receives benefit from use. Encouraged to increase usage above 4 hours to goal of 70% or greater. Otherwise, doing very well. No issues with mask fit or pressures. Aware of proper care/use. Understands risks of untreated OSA. Healthy weight management encouraged. Safe driving practices reviewed.  Patient Instructions  Continue to use CPAP every night, minimum of 4-6 hours a night.  Change equipment as directed. Wash your tubing with warm soap and water  daily, hang to dry. Wash humidifier portion weekly. Use bottled, distilled water  and change daily Be aware of reduced alertness and do not drive or operate heavy machinery if experiencing this or drowsiness.  Exercise encouraged, as tolerated. Healthy weight management discussed.  Avoid or decrease alcohol consumption and medications that make you more sleepy, if possible. Notify if persistent daytime sleepiness occurs even with consistent use of PAP therapy.  Change CPAP supplies... Every month Mask cushions and/or nasal pillows CPAP machine filters Every 3 months Mask frame (not including the headgear) CPAP tubing Every 6 months Mask headgear Chin strap (if applicable) Humidifier water  tub  Increase your use of CPAP as much as possible. Goal is 70% of the nights or greater above 4 hours of use   Follow up in one year with any sleep provider, or sooner, if needed    Advised if symptoms do not improve or worsen, to please contact office for sooner follow up or seek emergency care.   I spent 25 minutes of dedicated to the  care of this patient on the date of this encounter to include pre-visit review of records, face-to-face time with the patient discussing conditions above, post visit ordering of testing, clinical  documentation with the electronic health record, making appropriate referrals as documented, and communicating necessary findings to members of the patients care team.  Comer LULLA Rouleau, NP 09/19/2024  Pt aware and understands NP's role.

## 2024-09-19 NOTE — Patient Instructions (Addendum)
 Continue to use CPAP every night, minimum of 4-6 hours a night.  Change equipment as directed. Wash your tubing with warm soap and water  daily, hang to dry. Wash humidifier portion weekly. Use bottled, distilled water  and change daily Be aware of reduced alertness and do not drive or operate heavy machinery if experiencing this or drowsiness.  Exercise encouraged, as tolerated. Healthy weight management discussed.  Avoid or decrease alcohol consumption and medications that make you more sleepy, if possible. Notify if persistent daytime sleepiness occurs even with consistent use of PAP therapy.  Change CPAP supplies... Every month Mask cushions and/or nasal pillows CPAP machine filters Every 3 months Mask frame (not including the headgear) CPAP tubing Every 6 months Mask headgear Chin strap (if applicable) Humidifier water  tub  Increase your use of CPAP as much as possible. Goal is 70% of the nights or greater above 4 hours of use   Follow up in one year with any sleep provider, or sooner, if needed

## 2024-09-26 ENCOUNTER — Ambulatory Visit: Admitting: Obstetrics and Gynecology

## 2024-09-26 ENCOUNTER — Encounter: Payer: Self-pay | Admitting: Obstetrics and Gynecology

## 2024-09-26 VITALS — BP 132/74 | HR 75 | Temp 99.0°F | Ht 67.5 in | Wt 246.0 lb

## 2024-09-26 DIAGNOSIS — Z09 Encounter for follow-up examination after completed treatment for conditions other than malignant neoplasm: Secondary | ICD-10-CM

## 2024-09-26 NOTE — Progress Notes (Signed)
 48 y.o. [id number] female with AUB, fibroids, endometrial polyp here for postoperative exam: 12wk s/p robotic assisted total laparoscopic hysterectomy, bilateral salpingectomy, cystoscopy and Mirena  IUD removal. [facility] RN first training and wellness labs. Married, vasectomy.  Pt states she is doing well post surgery. Denies N/V, abdominal pain, VB, dysuria. Normal BM and voids.   06/29/24 pathology: A. UTERUS AND CERVIX, WITH BILATERAL FALLOPIAN TUBES, HYSTERECTOMY:   Cervix:             No dysplasia identified.   Endometrium:   Atrophic decidualized endometrium, characteristic for exogenous    progestin effect.       Negative for hyperplasia, intraepithelial neoplasia (EIN) and malignancy.   Myometrium:        Multiple leiomyomas, largest 3.7 cm in greatest  dimension.   Fallopian Tubes:    Bilateral fimbriated fallopian tubes, without  significant epithelial atypia.    GYN HISTORY: Prior hysteroscopy, as noted, 2021  OB History  Gravida Para Term Preterm AB Living  2 2 2   2   SAB IAB Ectopic Multiple Live Births      2    # Outcome Date GA Lbr Len/2nd Weight Sex Type Anes PTL Lv  2 Term 08/25/05   9 lb 6 oz (4.252 kg) F Vag-Spont   LIV  1 Term 05/04/04   7 lb 6 oz (3.345 kg) M Vag-Spont   LIV    Past Medical History:  Diagnosis Date   Anxiety 2014   Arthritis    right arm, right knee   Depression    Dry eye syndrome of both eyes    Endometrial polyp    Frequent headaches    History of palpitations 03/2021   cardiology evalution by dr b. jeanett etang (lov in epic 05-17-2021;  normal echo 05-15-2021;  normal lexiscan  02-02-2020;  Zio 06/ 2022  NSR w/ no sig arrhythmia only rare PAC/ PVC)   Hyperlipidemia    Hypertension    Iron deficiency anemia    Menorrhagia with irregular cycle    OAB (overactive bladder)    OSA on CPAP    ( 06-22-2024  pt stated uses cpap nightly)  followed by [facility]--  almarie ferrari NP;   HST in epic 04-11-2019  AHI 16/hr   TMJ (temporomandibular  joint disorder)    06-22-2024   pt stated uses mouth guard at night (grinds teeth)   Type 2 diabetes mellitus (HCC)    followed by pcp  (06-22-2024 pt stated checks blood sugar at home x2 per week fasting,  avearge 89--90)   Uterine fibroid    Wears glasses     Past Surgical History:  Procedure Laterality Date   COLONOSCOPY WITH PROPOFOL  N/A 07/18/2019   Procedure: COLONOSCOPY WITH PROPOFOL ;  Surgeon: Jinny Carmine, MD;  Location: [facility];  Service: Endoscopy;  Laterality: N/A;  Latex Sleep apnea   CYSTOSCOPY N/A 06/29/2024   Procedure: CYSTOSCOPY;  Surgeon: Dallie Vera GAILS, MD;  Location: [facility];  Service: Gynecology;  Laterality: N/A;   HYSTERECTOMY, TOTAL, LAPAROSCOPIC, ROBOT-ASSISTED WITH SALPINGECTOMY Bilateral 06/29/2024   Procedure: HYSTERECTOMY, TOTAL, LAPAROSCOPIC, ROBOT-ASSISTED WITH SALPINGECTOMY;  Surgeon: Dallie Vera GAILS, MD;  Location: [facility] OR;  Service: Gynecology;  Laterality: Bilateral;   HYSTEROSCOPY WITH D & C N/A 05/20/2018   Procedure: DILATATION AND CURETTAGE /HYSTEROSCOPY;  Surgeon: Lake Read, MD;  Location: [facility];  Service: Gynecology;  Laterality: N/A;   HYSTEROSCOPY WITH D & C N/A 10/25/2020   Procedure: DILATATION AND CURETTAGE /HYSTEROSCOPY;  Surgeon: Lake Read, MD;  Location: [facility];  Service: Gynecology;  Laterality: N/A;   INTRAUTERINE DEVICE (IUD) INSERTION N/A 05/20/2018   Procedure: INTRAUTERINE DEVICE (IUD) INSERTION;  Surgeon: Lake Read, MD;  Location: [facility];  Service: Gynecology;  Laterality: N/A;   INTRAUTERINE DEVICE (IUD) INSERTION  10/25/2020   Procedure: INTRAUTERINE DEVICE (IUD) INSERTION;  Surgeon: Lake Read, MD;  Location: [facility];  Service: Gynecology;;   IUD REMOVAL N/A 06/29/2024   Procedure: REMOVAL, INTRAUTERINE DEVICE;  Surgeon: Dallie Vera GAILS, MD;  Location: [facility];  Service: Gynecology;  Laterality: N/A;   TRIGGER FINGER RELEASE Right 05/26/2018   by dr christella. kathlynn ;  local;  right long finger    WISDOM TOOTH EXTRACTION      Current Outpatient Medications on File Prior to Visit  Medication Sig Dispense Refill   acetaminophen  (TYLENOL ) 500 MG tablet Take 2 tablets (1,000 mg total) by mouth every 8 (eight) hours as needed. 90 tablet 0   ALPRAZolam  (XANAX ) 0.5 MG tablet Take 1 tablet (0.5 mg total) by mouth daily as needed for anxiety. 30 tablet 0   Ascorbic Acid (VITAMIN C) 1000 MG tablet Take 1,000 mg by mouth daily with lunch. capsule     b complex vitamins capsule Take 1 capsule by mouth daily with lunch.     Cholecalciferol (VITAMIN D3) 125 MCG (5000 UT) CAPS Take 5,000 Units by mouth daily with lunch.     Cyanocobalamin (B-12) 1000 MCG TABS Take 1,000 mcg by mouth daily with lunch. capsule     docusate sodium (COLACE) 100 MG capsule Take 100 mg by mouth daily with lunch.     Elderberry-Vitamin C-Zinc (ELDERBERRY IMMUNE HEALTH GUMMY PO) Take 2 each by mouth daily with lunch.     escitalopram  (LEXAPRO ) 20 MG tablet Take 1 tablet (20 mg total) by mouth daily. 90 tablet 3   ferrous sulfate 325 (65 FE) MG tablet Take 325 mg by mouth daily with lunch.     ibuprofen  (ADVIL ) 800 MG tablet Take 1 tablet (800 mg total) by mouth every 8 (eight) hours as needed for up to 42 doses. 42 tablet 0   levocetirizine (XYZAL) 5 MG tablet Take 5 mg by mouth at bedtime.     Magnesium 400 MG CAPS Take 2 capsules by mouth at bedtime.     metoprolol  succinate (TOPROL  XL) 50 MG 24 hr tablet Take 1 tablet (50 mg total) by mouth daily. Take with or immediately following a meal. Stop HCTZ and Toprol  25 90 tablet 0   oxybutynin  (DITROPAN  XL) 5 MG 24 hr tablet Take 1 tablet (5 mg total) by mouth at bedtime. 90 tablet 3   pravastatin  (PRAVACHOL ) 40 MG tablet Take 1 tablet (40 mg total) by mouth daily. 90 tablet 3   Turmeric 500 MG CAPS Take 2 capsules by mouth daily with lunch.     Zinc 50 MG TABS Take 1 tablet by mouth daily with lunch. capsule     methylPREDNISolone  (MEDROL ) 4 MG TBPK tablet Take as directed.  (Patient not taking: Reported on 09/26/2024) 21 tablet 0   Semaglutide , 2 MG/DOSE, (OZEMPIC , 2 MG/DOSE,) 8 MG/3ML SOPN Inject 2 mg into the skin once a week. (Patient not taking: Reported on 09/26/2024) 9 mL 3   No current facility-administered medications on file prior to visit.    Allergies  Allergen Reactions   Latex Itching    GLOVES (extended exposure)      PE Today's Vitals   09/26/24 1615  BP: 132/74  Pulse: 75  Temp: 99 F (37.2 C)  TempSrc: Oral  SpO2: 98%  Weight: 246 lb (111.6 kg)  Height: 5' 7.5 (1.715 m)    Body mass index is 37.96 kg/m.  Physical Exam Vitals reviewed. Exam conducted with a chaperone present.  Constitutional:      General: She is not in acute distress.    Appearance: Normal appearance.  HENT:     Head: Normocephalic and atraumatic.     Nose: Nose normal.  Eyes:     Extraocular Movements: Extraocular movements intact.     Conjunctiva/sclera: Conjunctivae normal.  Pulmonary:     Effort: Pulmonary effort is normal.  Abdominal:     General: There is no distension.     Palpations: Abdomen is soft.     Tenderness: There is no abdominal tenderness.     Comments: 4 port site incisions well healed.  Genitourinary:    General: Normal vulva.     Exam position: Lithotomy position.     Vagina: Normal.     Adnexa: Right adnexa normal and left adnexa normal.     Comments: Vaginal cuff well healed, intact Musculoskeletal:        General: Normal range of motion.     Cervical back: Normal range of motion.  Lymphadenopathy:     Lower Body: No right inguinal adenopathy. No left inguinal adenopathy.  Neurological:     General: No focal deficit present.     Mental Status: She is alert.  Psychiatric:        Mood and Affect: Mood normal.        Behavior: Behavior normal.      Assessment and Plan:       Postop check  Normal postop exam.  Cleared to RTW and from pelvic rest for 6wk No restrictions All questions answered    Vera LULLA Pa,  MD

## 2024-09-29 DIAGNOSIS — M7661 Achilles tendinitis, right leg: Secondary | ICD-10-CM | POA: Diagnosis not present

## 2024-10-05 ENCOUNTER — Other Ambulatory Visit: Payer: Self-pay

## 2024-10-06 DIAGNOSIS — G8929 Other chronic pain: Secondary | ICD-10-CM | POA: Diagnosis not present

## 2024-10-06 DIAGNOSIS — M25571 Pain in right ankle and joints of right foot: Secondary | ICD-10-CM | POA: Diagnosis not present

## 2024-10-07 ENCOUNTER — Other Ambulatory Visit: Payer: Self-pay

## 2024-10-12 ENCOUNTER — Ambulatory Visit: Admitting: Nurse Practitioner

## 2024-10-12 ENCOUNTER — Encounter: Payer: Self-pay | Admitting: Nurse Practitioner

## 2024-10-12 ENCOUNTER — Other Ambulatory Visit: Payer: Self-pay

## 2024-10-12 VITALS — BP 136/84 | HR 71 | Temp 98.3°F | Ht 67.5 in | Wt 245.0 lb

## 2024-10-12 DIAGNOSIS — R350 Frequency of micturition: Secondary | ICD-10-CM

## 2024-10-12 DIAGNOSIS — I10 Essential (primary) hypertension: Secondary | ICD-10-CM

## 2024-10-12 DIAGNOSIS — R7303 Prediabetes: Secondary | ICD-10-CM

## 2024-10-12 DIAGNOSIS — E66812 Obesity, class 2: Secondary | ICD-10-CM | POA: Diagnosis not present

## 2024-10-12 DIAGNOSIS — Z6837 Body mass index (BMI) 37.0-37.9, adult: Secondary | ICD-10-CM | POA: Diagnosis not present

## 2024-10-12 DIAGNOSIS — Z90710 Acquired absence of both cervix and uterus: Secondary | ICD-10-CM | POA: Insufficient documentation

## 2024-10-12 DIAGNOSIS — G5601 Carpal tunnel syndrome, right upper limb: Secondary | ICD-10-CM

## 2024-10-12 DIAGNOSIS — K219 Gastro-esophageal reflux disease without esophagitis: Secondary | ICD-10-CM

## 2024-10-12 LAB — COMPREHENSIVE METABOLIC PANEL WITH GFR
ALT: 23 U/L (ref 3–35)
AST: 18 U/L (ref 5–37)
Albumin: 4.5 g/dL (ref 3.5–5.2)
Alkaline Phosphatase: 81 U/L (ref 39–117)
BUN: 13 mg/dL (ref 6–23)
CO2: 31 meq/L (ref 19–32)
Calcium: 9.1 mg/dL (ref 8.4–10.5)
Chloride: 101 meq/L (ref 96–112)
Creatinine, Ser: 0.68 mg/dL (ref 0.40–1.20)
GFR: 101.47 mL/min (ref >60.00)
Glucose, Bld: 100 mg/dL — ABNORMAL HIGH (ref 70–99)
Potassium: 4.4 meq/L (ref 3.5–5.1)
Sodium: 138 meq/L (ref 135–145)
Total Bilirubin: 0.4 mg/dL (ref 0.2–1.2)
Total Protein: 7.2 g/dL (ref 6.0–8.3)

## 2024-10-12 LAB — HEMOGLOBIN A1C: Hgb A1c MFr Bld: 6.2 % (ref 4.6–6.5)

## 2024-10-12 MED ORDER — OMEPRAZOLE 20 MG PO CPDR
20.0000 mg | DELAYED_RELEASE_CAPSULE | Freq: Two times a day (BID) | ORAL | 1 refills | Status: AC
  Filled 2024-10-12: qty 180, 90d supply, fill #0

## 2024-10-12 MED ORDER — TIRZEPATIDE-WEIGHT MANAGEMENT 2.5 MG/0.5ML ~~LOC~~ SOLN: 2.5000 mg | SUBCUTANEOUS | 2 refills | Status: AC

## 2024-10-12 NOTE — Progress Notes (Signed)
 Leron Glance, NP-C Phone: [phone]  Kathleen Burke is a 48 y.o. female who presents today for follow up.   Discussed the use of AI scribe software for clinical note transcription with the patient, who gave verbal consent to proceed.  History of Present Illness   Kathleen Burke is a 48 year old female with prediabetes and hypertension who presents with indigestion and reflux symptoms.  She experiences significant indigestion, particularly in the mornings, despite taking medication which she believes might be Prilosec. Symptoms of reflux include a bitter taste in her mouth, regurgitation, heartburn, and occasional dysphagia. Certain foods, such as cucumber salad, exacerbate her symptoms.  She has a history of Achilles tendinitis and carpal tunnel syndrome, with numbness in her fingers and pain in her thumb. She wears a brace on her right hand and has not received a carpal tunnel injection due to lack of transportation. She reports difficulty with tasks such as picking up items due to her hand condition.  Her blood pressure has been slightly elevated, and she is currently taking metoprolol . No chest pain, shortness of breath, dizziness, or swelling.  She was previously on Ozempic  for prediabetes, but her insurance no longer covers it. Her last A1c was 5.6. She mentions feeling hungrier after starting to eat, despite not feeling hungry beforehand. She is mindful of her diet, avoiding beef due to digestion issues, and is cooking more at home due to her mother-in-law living with her.  She underwent a hysterectomy in September, which went well, and she feels much better post-surgery. She has had several post-operative checks and was recently cleared by her surgeon.  She is currently taking oxybutynin  for bladder issues, which she reports is working well.      Tobacco Use History[1]  Medications Ordered Prior to Encounter[2]   ROS see history of present illness  Objective  Physical  Exam Vitals:   10/12/24 0827  BP: 136/84  Pulse: 71  Temp: 98.3 F (36.8 C)  SpO2: 99%    BP Readings from Last 3 Encounters:  10/12/24 136/84  09/26/24 132/74  09/19/24 134/80   Wt Readings from Last 3 Encounters:  10/12/24 245 lb (111.1 kg)  09/26/24 246 lb (111.6 kg)  09/19/24 240 lb (108.9 kg)    Physical Exam Constitutional:      General: She is not in acute distress.    Appearance: Normal appearance.  HENT:     Head: Normocephalic.  Cardiovascular:     Rate and Rhythm: Normal rate and regular rhythm.     Heart sounds: Normal heart sounds.  Pulmonary:     Effort: Pulmonary effort is normal.     Breath sounds: Normal breath sounds.  Skin:    General: Skin is warm and dry.  Neurological:     General: No focal deficit present.     Mental Status: She is alert.  Psychiatric:        Mood and Affect: Mood normal.        Behavior: Behavior normal.      Assessment/Plan: Please see individual problem list.  Gastroesophageal reflux disease without esophagitis Assessment & Plan: Started omeprazole  twice daily for 8 weeks, reducing to once daily if symptoms improve. Educated on dietary triggers such as caffeine, alcohol, spicy foods, greasy foods, and acidic foods. Advised to avoid eating late and lying down after meals. Instructed to report if symptoms do not improve.  Orders: -     Omeprazole ; Take 1 capsule (20 mg total) by mouth 2 (  two) times daily before a meal.  Dispense: 180 capsule; Refill: 1  Class 2 severe obesity with serious comorbidity and body mass index (BMI) of 37.0 to 37.9 in adult, unspecified obesity type Assessment & Plan: Previously managed with Ozempic , now exploring alternatives due to insurance issues. Discussed weight and A1c concerns. Check A1c. Discussed self-pay options for Zepbound , starting at 2.5 mg weekly. Prescription sent to [facility]. Advised on a low-carb, high-protein diet and regular exercise. We will continue to monitor.    Orders: -     Tirzepatide -Weight Management; Inject 2.5 mg into the skin once a week.  Dispense: 2 mL; Refill: 2  Prediabetes Assessment & Plan: Previously managed with Ozempic , now exploring alternatives due to insurance issues. Discussed weight and A1c concerns. Check A1c. Discussed self-pay options for Zepbound , starting at 2.5 mg weekly. Prescription sent to [facility]. Advised on a low-carb, high-protein diet and regular exercise. We will continue to monitor.   Orders: -     Tirzepatide -Weight Management; Inject 2.5 mg into the skin once a week.  Dispense: 2 mL; Refill: 2 -     Hemoglobin A1c  Primary hypertension Assessment & Plan: Her hypertension is adequately controlled on Toprol  XL 50mg  daily. We will continue the current regimen. Follow up with Cardiology as scheduled.   Orders: -     Comprehensive metabolic panel with GFR  Carpal tunnel syndrome of right wrist Assessment & Plan: Right hand carpal tunnel syndrome with numbness and pain managed with a brace. Continue wearing brace for right hand. Follow up with Ortho as scheduled.    Urinary frequency Assessment & Plan: Managed with oxybutynin , effective. Continue oxybutynin .      Return in about 2 months (around 12/13/2024) for Follow up.   Leron Glance, NP-C [facility] [place]     [1]  Social History Tobacco Use  Smoking Status Never   Passive exposure: Never  Smokeless Tobacco Never  [2]  Current Outpatient Medications on File Prior to Visit  Medication Sig Dispense Refill   acetaminophen  (TYLENOL ) 500 MG tablet Take 2 tablets (1,000 mg total) by mouth every 8 (eight) hours as needed. 90 tablet 0   ALPRAZolam  (XANAX ) 0.5 MG tablet Take 1 tablet (0.5 mg total) by mouth daily as needed for anxiety. 30 tablet 0   Ascorbic Acid (VITAMIN C) 1000 MG tablet Take 1,000 mg by mouth daily with lunch. capsule     b complex vitamins capsule Take 1 capsule by mouth daily with lunch.      Cholecalciferol (VITAMIN D3) 125 MCG (5000 UT) CAPS Take 5,000 Units by mouth daily with lunch.     Cyanocobalamin (B-12) 1000 MCG TABS Take 1,000 mcg by mouth daily with lunch. capsule     docusate sodium (COLACE) 100 MG capsule Take 100 mg by mouth daily with lunch.     Elderberry-Vitamin C-Zinc (ELDERBERRY IMMUNE HEALTH GUMMY PO) Take 2 each by mouth daily with lunch.     escitalopram  (LEXAPRO ) 20 MG tablet Take 1 tablet (20 mg total) by mouth daily. 90 tablet 3   ferrous sulfate 325 (65 FE) MG tablet Take 325 mg by mouth daily with lunch.     ibuprofen  (ADVIL ) 800 MG tablet Take 1 tablet (800 mg total) by mouth every 8 (eight) hours as needed for up to 42 doses. 42 tablet 0   levocetirizine (XYZAL) 5 MG tablet Take 5 mg by mouth at bedtime.     Magnesium 400 MG CAPS Take 2 capsules  by mouth at bedtime.     metoprolol  succinate (TOPROL  XL) 50 MG 24 hr tablet Take 1 tablet (50 mg total) by mouth daily. Take with or immediately following a meal. Stop HCTZ and Toprol  25 90 tablet 0   oxybutynin  (DITROPAN  XL) 5 MG 24 hr tablet Take 1 tablet (5 mg total) by mouth at bedtime. 90 tablet 3   pravastatin  (PRAVACHOL ) 40 MG tablet Take 1 tablet (40 mg total) by mouth daily. 90 tablet 3   Turmeric 500 MG CAPS Take 2 capsules by mouth daily with lunch.     Zinc 50 MG TABS Take 1 tablet by mouth daily with lunch. capsule     No current facility-administered medications on file prior to visit.

## 2024-10-12 NOTE — Assessment & Plan Note (Signed)
 Managed with oxybutynin , effective. Continue oxybutynin .

## 2024-10-12 NOTE — Assessment & Plan Note (Signed)
 Previously managed with Ozempic , now exploring alternatives due to insurance issues. Discussed weight and A1c concerns. Check A1c. Discussed self-pay options for Zepbound , starting at 2.5 mg weekly. Prescription sent to [facility]. Advised on a low-carb, high-protein diet and regular exercise. We will continue to monitor.

## 2024-10-12 NOTE — Assessment & Plan Note (Signed)
 Right hand carpal tunnel syndrome with numbness and pain managed with a brace. Continue wearing brace for right hand. Follow up with Ortho as scheduled.

## 2024-10-12 NOTE — Assessment & Plan Note (Signed)
 Her hypertension is adequately controlled on Toprol  XL 50mg  daily. We will continue the current regimen. Follow up with Cardiology as scheduled.

## 2024-10-12 NOTE — Assessment & Plan Note (Signed)
 Started omeprazole  twice daily for 8 weeks, reducing to once daily if symptoms improve. Educated on dietary triggers such as caffeine, alcohol, spicy foods, greasy foods, and acidic foods. Advised to avoid eating late and lying down after meals. Instructed to report if symptoms do not improve.

## 2024-10-18 ENCOUNTER — Ambulatory Visit: Payer: Self-pay | Admitting: Nurse Practitioner

## 2024-10-26 ENCOUNTER — Ambulatory Visit
Admission: RE | Admit: 2024-10-26 | Discharge: 2024-10-26 | Disposition: A | Discharge status: Home / Self Care | Source: Ambulatory Visit | Attending: Nurse Practitioner | Admitting: Nurse Practitioner

## 2024-10-26 DIAGNOSIS — Z1231 Encounter for screening mammogram for malignant neoplasm of breast: Secondary | ICD-10-CM | POA: Diagnosis present

## 2024-10-28 ENCOUNTER — Telehealth: Payer: Self-pay | Admitting: Nurse Practitioner

## 2024-10-28 NOTE — Telephone Encounter (Signed)
 Copied from CRM [id number]. Topic: Clinical - Medical Advice >> Oct 28, 2024 11:05 AM Ismael A wrote: Reason for CRM: pt is confirming she does snore through her cpap machine, wants to know if she needs to be adjusted, pt states she has gained about 40lbs as well and not sure if that's contributing to snoring

## 2024-11-29 ENCOUNTER — Other Ambulatory Visit: Payer: Self-pay

## 2024-12-16 ENCOUNTER — Ambulatory Visit: Admitting: Nurse Practitioner

## 2024-12-29 ENCOUNTER — Ambulatory Visit: Admitting: Obstetrics and Gynecology
